# Patient Record
Sex: Female | Born: 1950 | Race: White | Hispanic: No | Smoking: Never smoker
Health system: Southern US, Community
[De-identification: ages and names within clinical notes are randomized; demographics above are authoritative.]

## PROBLEM LIST (undated history)

## (undated) DIAGNOSIS — I10 Essential (primary) hypertension: Secondary | ICD-10-CM

## (undated) DIAGNOSIS — J45909 Unspecified asthma, uncomplicated: Secondary | ICD-10-CM

## (undated) DIAGNOSIS — H409 Unspecified glaucoma: Secondary | ICD-10-CM

## (undated) DIAGNOSIS — F329 Major depressive disorder, single episode, unspecified: Secondary | ICD-10-CM

## (undated) DIAGNOSIS — Z860100 Personal history of colon polyps, unspecified: Secondary | ICD-10-CM

## (undated) DIAGNOSIS — F32A Depression, unspecified: Secondary | ICD-10-CM

## (undated) DIAGNOSIS — M1990 Unspecified osteoarthritis, unspecified site: Secondary | ICD-10-CM

## (undated) DIAGNOSIS — C4491 Basal cell carcinoma of skin, unspecified: Secondary | ICD-10-CM

## (undated) HISTORY — DX: Unspecified glaucoma: H40.9

## (undated) HISTORY — PX: JOINT REPLACEMENT: SHX530

## (undated) HISTORY — DX: Basal cell carcinoma of skin, unspecified: C44.91

## (undated) HISTORY — PX: MOHS SURGERY: SUR867

## (undated) HISTORY — PX: TOTAL HIP ARTHROPLASTY: SHX124

## (undated) HISTORY — DX: Unspecified asthma, uncomplicated: J45.909

## (undated) HISTORY — DX: Personal history of colonic polyps: Z86.010

## (undated) HISTORY — PX: REPLACEMENT TOTAL KNEE BILATERAL: SUR1225

## (undated) HISTORY — DX: Unspecified osteoarthritis, unspecified site: M19.90

## (undated) HISTORY — DX: Depression, unspecified: F32.A

## (undated) HISTORY — DX: Essential (primary) hypertension: I10

## (undated) HISTORY — DX: Personal history of colon polyps, unspecified: Z86.0100

---
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
## 1985-06-30 ENCOUNTER — Emergency Department: Admit: 1985-06-30 | Disposition: A | Discharge status: Home / Self Care | Payer: Self-pay | Source: Ambulatory Visit

## 1986-06-27 ENCOUNTER — Emergency Department: Admit: 1986-06-27 | Payer: Self-pay | Source: Ambulatory Visit

## 1988-06-08 ENCOUNTER — Emergency Department: Admit: 1988-06-08 | Payer: Self-pay | Source: Ambulatory Visit

## 1993-04-25 ENCOUNTER — Emergency Department: Admit: 1993-04-25 | Disposition: A | Discharge status: Home / Self Care | Payer: Self-pay | Source: Ambulatory Visit

## 1993-07-31 ENCOUNTER — Ambulatory Visit: Admit: 1993-07-31 | Disposition: A | Discharge status: Home / Self Care | Payer: Self-pay | Source: Ambulatory Visit

## 1993-12-28 ENCOUNTER — Ambulatory Visit: Admit: 1993-12-28 | Disposition: A | Discharge status: Home / Self Care | Payer: Self-pay | Source: Ambulatory Visit

## 1994-08-17 ENCOUNTER — Ambulatory Visit (INDEPENDENT_AMBULATORY_CARE_PROVIDER_SITE_OTHER): Admit: 1994-08-17 | Disposition: A | Discharge status: Home / Self Care | Payer: Self-pay | Source: Ambulatory Visit

## 1994-12-06 ENCOUNTER — Ambulatory Visit (INDEPENDENT_AMBULATORY_CARE_PROVIDER_SITE_OTHER): Admit: 1994-12-06 | Disposition: A | Discharge status: Home / Self Care | Payer: Self-pay | Source: Ambulatory Visit

## 1995-04-20 ENCOUNTER — Ambulatory Visit (INDEPENDENT_AMBULATORY_CARE_PROVIDER_SITE_OTHER): Admit: 1995-04-20 | Disposition: A | Discharge status: Home / Self Care | Payer: Self-pay | Source: Ambulatory Visit

## 1996-04-12 ENCOUNTER — Ambulatory Visit (INDEPENDENT_AMBULATORY_CARE_PROVIDER_SITE_OTHER): Admit: 1996-04-12 | Disposition: A | Discharge status: Home / Self Care | Payer: Self-pay | Source: Ambulatory Visit

## 1997-02-10 ENCOUNTER — Ambulatory Visit: Admit: 1997-02-10 | Disposition: A | Discharge status: Home / Self Care | Payer: Self-pay | Source: Ambulatory Visit

## 1997-06-18 ENCOUNTER — Ambulatory Visit (INDEPENDENT_AMBULATORY_CARE_PROVIDER_SITE_OTHER): Admit: 1997-06-18 | Disposition: A | Discharge status: Home / Self Care | Payer: Self-pay | Source: Ambulatory Visit

## 2000-10-14 ENCOUNTER — Ambulatory Visit (INDEPENDENT_AMBULATORY_CARE_PROVIDER_SITE_OTHER): Admit: 2000-10-14 | Disposition: A | Discharge status: Home / Self Care | Payer: Self-pay | Source: Ambulatory Visit

## 2000-11-26 ENCOUNTER — Ambulatory Visit
Admission: RE | Admit: 2000-11-26 | Disposition: A | Discharge status: Home / Self Care | Payer: Self-pay | Source: Ambulatory Visit

## 2002-11-25 ENCOUNTER — Ambulatory Visit (INDEPENDENT_AMBULATORY_CARE_PROVIDER_SITE_OTHER): Admit: 2002-11-25 | Disposition: A | Discharge status: Home / Self Care | Payer: Self-pay | Source: Ambulatory Visit

## 2003-03-04 ENCOUNTER — Ambulatory Visit (INDEPENDENT_AMBULATORY_CARE_PROVIDER_SITE_OTHER): Admit: 2003-03-04 | Disposition: A | Discharge status: Home / Self Care | Payer: Self-pay | Source: Ambulatory Visit

## 2003-11-18 ENCOUNTER — Ambulatory Visit
Admission: RE | Admit: 2003-11-18 | Disposition: A | Discharge status: Home / Self Care | Payer: Self-pay | Source: Ambulatory Visit

## 2004-12-07 ENCOUNTER — Ambulatory Visit
Admission: RE | Admit: 2004-12-07 | Disposition: A | Discharge status: Home / Self Care | Payer: Self-pay | Source: Ambulatory Visit

## 2004-12-12 ENCOUNTER — Ambulatory Visit
Admission: RE | Admit: 2004-12-12 | Disposition: A | Discharge status: Home / Self Care | Payer: Self-pay | Source: Ambulatory Visit

## 2004-12-27 ENCOUNTER — Ambulatory Visit
Admission: RE | Admit: 2004-12-27 | Disposition: A | Discharge status: Home / Self Care | Payer: Self-pay | Source: Ambulatory Visit

## 2005-05-12 ENCOUNTER — Emergency Department
Admission: EM | Admit: 2005-05-12 | Disposition: A | Discharge status: Home / Self Care | Payer: Self-pay | Source: Ambulatory Visit

## 2006-01-01 ENCOUNTER — Ambulatory Visit
Admission: RE | Admit: 2006-01-01 | Disposition: A | Discharge status: Home / Self Care | Payer: Self-pay | Source: Ambulatory Visit

## 2007-05-25 ENCOUNTER — Ambulatory Visit (INDEPENDENT_AMBULATORY_CARE_PROVIDER_SITE_OTHER): Admit: 2007-05-25 | Disposition: A | Discharge status: Home / Self Care | Payer: Self-pay | Source: Ambulatory Visit

## 2007-07-04 ENCOUNTER — Ambulatory Visit
Admission: RE | Admit: 2007-07-04 | Disposition: A | Discharge status: Home / Self Care | Payer: Self-pay | Source: Ambulatory Visit

## 2009-03-04 ENCOUNTER — Ambulatory Visit (INDEPENDENT_AMBULATORY_CARE_PROVIDER_SITE_OTHER): Admit: 2009-03-04 | Disposition: A | Discharge status: Home / Self Care | Payer: Self-pay | Source: Ambulatory Visit

## 2009-03-16 ENCOUNTER — Ambulatory Visit
Admission: RE | Admit: 2009-03-16 | Disposition: A | Discharge status: Home / Self Care | Payer: Self-pay | Source: Ambulatory Visit

## 2009-06-07 ENCOUNTER — Ambulatory Visit
Admission: RE | Admit: 2009-06-07 | Disposition: A | Discharge status: Home / Self Care | Payer: Self-pay | Source: Ambulatory Visit | Admitting: Gastroenterology

## 2009-12-11 ENCOUNTER — Ambulatory Visit (INDEPENDENT_AMBULATORY_CARE_PROVIDER_SITE_OTHER): Admit: 2009-12-11 | Disposition: A | Discharge status: Home / Self Care | Payer: Self-pay | Source: Ambulatory Visit

## 2010-06-28 ENCOUNTER — Ambulatory Visit
Admission: RE | Admit: 2010-06-28 | Disposition: A | Discharge status: Home / Self Care | Payer: Self-pay | Source: Ambulatory Visit

## 2010-07-29 ENCOUNTER — Ambulatory Visit (INDEPENDENT_AMBULATORY_CARE_PROVIDER_SITE_OTHER): Admit: 2010-07-29 | Disposition: A | Discharge status: Home / Self Care | Payer: Self-pay | Source: Ambulatory Visit

## 2010-08-28 ENCOUNTER — Ambulatory Visit
Admission: RE | Admit: 2010-08-28 | Disposition: A | Discharge status: Home / Self Care | Payer: Self-pay | Source: Ambulatory Visit

## 2010-10-18 ENCOUNTER — Ambulatory Visit
Admission: RE | Admit: 2010-10-18 | Disposition: A | Discharge status: Home / Self Care | Payer: Self-pay | Source: Ambulatory Visit

## 2010-11-30 ENCOUNTER — Ambulatory Visit
Admission: RE | Admit: 2010-11-30 | Disposition: A | Discharge status: Home / Self Care | Payer: Self-pay | Source: Ambulatory Visit

## 2011-02-21 ENCOUNTER — Ambulatory Visit
Admission: RE | Admit: 2011-02-21 | Disposition: A | Discharge status: Home / Self Care | Payer: Self-pay | Source: Ambulatory Visit

## 2012-11-07 ENCOUNTER — Ambulatory Visit
Admission: RE | Admit: 2012-11-07 | Disposition: A | Discharge status: Home / Self Care | Payer: Self-pay | Source: Ambulatory Visit

## 2013-01-06 ENCOUNTER — Ambulatory Visit
Admission: RE | Admit: 2013-01-06 | Disposition: A | Discharge status: Home / Self Care | Payer: Self-pay | Source: Ambulatory Visit | Attending: Gastroenterology | Admitting: Gastroenterology

## 2013-01-06 ENCOUNTER — Ambulatory Visit
Admission: RE | Admit: 2013-01-06 | Disposition: A | Discharge status: Home / Self Care | Payer: Self-pay | Source: Ambulatory Visit | Admitting: Gastroenterology

## 2014-11-01 ENCOUNTER — Ambulatory Visit
Admission: RE | Admit: 2014-11-01 | Discharge: 2014-11-01 | Disposition: A | Discharge status: Home / Self Care | Payer: BLUE CROSS/BLUE SHIELD | Source: Ambulatory Visit | Attending: Chiropractor | Admitting: Chiropractor

## 2014-11-01 ENCOUNTER — Other Ambulatory Visit: Payer: Self-pay | Admitting: Chiropractor

## 2014-11-01 ENCOUNTER — Ambulatory Visit
Admission: RE | Admit: 2014-11-01 | Discharge: 2014-11-01 | Disposition: A | Discharge status: Home / Self Care | Payer: BLUE CROSS/BLUE SHIELD | Source: Ambulatory Visit | Attending: Nurse Practitioner | Admitting: Nurse Practitioner

## 2014-11-01 ENCOUNTER — Other Ambulatory Visit: Payer: Self-pay | Admitting: Nurse Practitioner

## 2014-11-01 DIAGNOSIS — M5416 Radiculopathy, lumbar region: Secondary | ICD-10-CM

## 2014-11-01 DIAGNOSIS — Z1231 Encounter for screening mammogram for malignant neoplasm of breast: Secondary | ICD-10-CM

## 2015-03-03 ENCOUNTER — Other Ambulatory Visit: Payer: Self-pay | Admitting: Chiropractor

## 2015-03-03 ENCOUNTER — Ambulatory Visit
Admission: RE | Admit: 2015-03-03 | Discharge: 2015-03-03 | Disposition: A | Discharge status: Home / Self Care | Payer: BLUE CROSS/BLUE SHIELD | Source: Ambulatory Visit | Attending: Chiropractor | Admitting: Chiropractor

## 2015-03-03 DIAGNOSIS — M25571 Pain in right ankle and joints of right foot: Secondary | ICD-10-CM

## 2015-03-03 DIAGNOSIS — M19071 Primary osteoarthritis, right ankle and foot: Secondary | ICD-10-CM | POA: Insufficient documentation

## 2015-03-10 ENCOUNTER — Emergency Department: Payer: BLUE CROSS/BLUE SHIELD

## 2015-03-10 ENCOUNTER — Emergency Department
Admission: EM | Admit: 2015-03-10 | Discharge: 2015-03-10 | Disposition: A | Discharge status: Home / Self Care | Payer: BLUE CROSS/BLUE SHIELD | Attending: Emergency Medicine | Admitting: Emergency Medicine

## 2015-03-10 DIAGNOSIS — R197 Diarrhea, unspecified: Secondary | ICD-10-CM | POA: Insufficient documentation

## 2015-03-10 DIAGNOSIS — R112 Nausea with vomiting, unspecified: Secondary | ICD-10-CM | POA: Insufficient documentation

## 2015-03-10 DIAGNOSIS — K529 Noninfective gastroenteritis and colitis, unspecified: Secondary | ICD-10-CM

## 2015-03-10 DIAGNOSIS — R1010 Upper abdominal pain, unspecified: Secondary | ICD-10-CM | POA: Insufficient documentation

## 2015-03-10 LAB — COMPREHENSIVE METABOLIC PANEL
ALT: 27 U/L (ref 0–55)
AST (SGOT): 22 U/L (ref 10–42)
Albumin/Globulin Ratio: 1.15 Ratio (ref 0.70–1.50)
Albumin: 3.8 gm/dL (ref 3.5–5.0)
Alkaline Phosphatase: 52 U/L (ref 40–145)
Anion Gap: 16.8 mMol/L (ref 7.0–18.0)
BUN / Creatinine Ratio: 23.1 Ratio (ref 10.0–30.0)
BUN: 18 mg/dL (ref 7–22)
Bilirubin, Total: 0.5 mg/dL (ref 0.1–1.2)
CO2: 26.4 mMol/L (ref 20.0–30.0)
Calcium: 9.2 mg/dL (ref 8.5–10.5)
Chloride: 101 mMol/L (ref 98–110)
Creatinine: 0.78 mg/dL (ref 0.60–1.20)
EGFR: >60 mL/min/{1.73_m2}
Globulin: 3.3 gm/dL (ref 2.0–4.0)
Glucose: 129 mg/dL — ABNORMAL HIGH (ref 70–99)
Osmolality Calc: 285 mOsm/kg (ref 275–300)
Potassium: 3.2 mMol/L — ABNORMAL LOW (ref 3.5–5.3)
Protein, Total: 7.1 gm/dL (ref 6.0–8.3)
Sodium: 141 mMol/L (ref 136–147)

## 2015-03-10 LAB — VH URINALYSIS WITH MICROSCOPIC
Bilirubin, UA: NEGATIVE
Blood, UA: NEGATIVE
Glucose, UA: NEGATIVE mg/dL
Ketones UA: 20 mg/dL — AB
Leukocyte Esterase, UA: NEGATIVE Leu/uL
Nitrite, UA: NEGATIVE
Protein, UR: NEGATIVE mg/dL
RBC, UA: 2 /hpf (ref 0–5)
Squam Epithel, UA: <1 /hpf (ref 0–2)
Urine Specific Gravity: 1.014 (ref 1.001–1.040)
Urobilinogen, UA: NORMAL mg/dL
pH, Urine: 8 pH (ref 5.0–8.0)

## 2015-03-10 LAB — CBC AND DIFFERENTIAL
Basophils %: 0.6 % (ref 0.0–3.0)
Basophils Absolute: 0 10*3/uL (ref 0.0–0.3)
Eosinophils %: 0.9 % (ref 0.0–7.0)
Eosinophils Absolute: 0.1 10*3/uL (ref 0.0–0.8)
Hematocrit: 45.8 % (ref 36.0–48.0)
Hemoglobin: 15.5 gm/dL (ref 12.0–16.0)
Lymphocytes Absolute: 1.2 10*3/uL (ref 0.6–5.1)
Lymphocytes: 15.4 % (ref 15.0–46.0)
MCH: 33 pg (ref 28–35)
MCHC: 34 gm/dL (ref 32–36)
MCV: 98 fL (ref 80–100)
MPV: 7.8 fL (ref 6.0–10.0)
Monocytes Absolute: 0.5 10*3/uL (ref 0.1–1.7)
Monocytes: 6.4 % (ref 3.0–15.0)
Neutrophils %: 76.8 % (ref 42.0–78.0)
Neutrophils Absolute: 6 10*3/uL (ref 1.7–8.6)
PLT CT: 263 10*3/uL (ref 130–440)
RBC: 4.69 10*6/uL (ref 3.80–5.00)
RDW: 11 % (ref 11.0–14.0)
WBC: 7.8 10*3/uL (ref 4.0–11.0)

## 2015-03-10 LAB — LIPASE: Lipase: 15 U/L (ref 8–78)

## 2015-03-10 MED ORDER — ONDANSETRON HCL 4 MG PO TABS
4.0000 mg | ORAL_TABLET | Freq: Four times a day (QID) | ORAL | Status: DC | PRN
Start: 2015-03-10 — End: 2015-04-10

## 2015-03-10 MED ORDER — VH HYDROMORPHONE HCL PF 1 MG/ML CARPUJECT
1.0000 mg | Freq: Once | INTRAMUSCULAR | Status: AC
Start: 2015-03-10 — End: 2015-03-10
  Administered 2015-03-10: 1 mg via INTRAVENOUS

## 2015-03-10 MED ORDER — VH HYDROMORPHONE HCL 1 MG/ML (NARRATOR)
INTRAMUSCULAR | Status: AC
Start: 2015-03-10 — End: ?
  Filled 2015-03-10: qty 1

## 2015-03-10 MED ORDER — SODIUM CHLORIDE 0.9 % IV BOLUS
1000.0000 mL | Freq: Once | INTRAVENOUS | Status: AC
Start: 2015-03-10 — End: 2015-03-10
  Administered 2015-03-10: 1000 mL via INTRAVENOUS

## 2015-03-10 MED ORDER — ONDANSETRON HCL 4 MG/2ML IJ SOLN
4.0000 mg | Freq: Once | INTRAMUSCULAR | Status: AC
Start: 2015-03-10 — End: 2015-03-10
  Administered 2015-03-10: 4 mg via INTRAVENOUS

## 2015-03-10 MED ORDER — ONDANSETRON 4 MG PO TBDP
4.0000 mg | ORAL_TABLET | Freq: Four times a day (QID) | ORAL | Status: DC | PRN
Start: 2015-03-10 — End: 2015-03-11
  Administered 2015-03-10: 4 mg via ORAL

## 2015-03-10 MED ORDER — ONDANSETRON 4 MG PO TBDP
ORAL_TABLET | ORAL | Status: AC
Start: 2015-03-10 — End: ?
  Filled 2015-03-10: qty 3

## 2015-03-10 MED ORDER — ONDANSETRON HCL 4 MG/2ML IJ SOLN
INTRAMUSCULAR | Status: AC
Start: 2015-03-10 — End: ?
  Filled 2015-03-10: qty 2

## 2015-03-10 NOTE — ED Notes (Signed)
Pt states that for 3 days she has had upper abdominal with nausea, dry heaves, diarrhea. Pt has not been able to keep anything down po for these 3 days as well.Pts family thinks she has food poisoning.

## 2015-03-10 NOTE — ED Provider Notes (Signed)
Physician/Midlevel provider first contact with patient: 03/10/15 1807         History     Chief Complaint   Patient presents with   . Emesis     Patient is a 64 y.o. female presenting with vomiting. The history is provided by the patient.   Emesis  Associated symptoms: abdominal pain and diarrhea    Associated symptoms: no arthralgias, no headaches, no myalgias and no sore throat       Patient present sto ED with abdominal pain and emesis. Patient complains up upper abdominal pain for 3 days. Patient has had diarrhea and nausea with dry heaves for 3 days. Patient states her abdomen feels distended. Patient denies spotting blood in stool. Patient denies having chest pain or cough. Patient does have a history of asthma. Patient is not a diabetic. Patient has had 3 C-sections in the past. No current fever.      Past Medical History   Diagnosis Date   . Glaucoma        Past Surgical History   Procedure Laterality Date   . Cesarean section         History reviewed. No pertinent family history.    Social  History   Substance Use Topics   . Smoking status: Never Smoker    . Smokeless tobacco: Not on file   . Alcohol Use: Yes      Comment: daily       .     Allergies   Allergen Reactions   . Other      "eye drop"       Home Medications     Last Medication Reconciliation Action:  Unable to Assess Knox Saliva, RN 03/10/2015  6:19 PM          No Medications           Review of Systems   Constitutional: Negative for fever.   HENT: Negative for congestion, ear pain, facial swelling, mouth sores, nosebleeds and sore throat.    Eyes: Negative for pain.   Respiratory: Negative for cough.    Cardiovascular: Negative for chest pain and leg swelling.   Gastrointestinal: Positive for nausea, vomiting, abdominal pain and diarrhea.   Genitourinary: Negative for dysuria.   Musculoskeletal: Negative for myalgias, back pain, arthralgias and neck pain.   Skin: Negative for rash.   Neurological: Negative for headaches.    Psychiatric/Behavioral: Negative for confusion.       Physical Exam    BP: (!) 155/99 mmHg, Heart Rate: 63, Temp: 98.4 F (36.9 C), Resp Rate: 18, SpO2: 96 %, Weight: 84.9 kg    Physical Exam   Constitutional: She is oriented to person, place, and time. She appears well-developed and well-nourished. No distress.   HENT:   Head: Normocephalic.   Mouth/Throat: Oropharynx is clear and moist.   Eyes: Conjunctivae are normal. Pupils are equal, round, and reactive to light.   Neck: Normal range of motion. Neck supple.   Cardiovascular: Normal rate, regular rhythm and normal heart sounds.    Pulmonary/Chest: Effort normal and breath sounds normal. No respiratory distress.   Abdominal: Soft. There is tenderness. There is no guarding.   Mild diffuse tenderness. No guarding.   Musculoskeletal: Normal range of motion. She exhibits no tenderness.   Neurological: She is alert and oriented to person, place, and time.   Skin: Skin is warm and dry. She is not diaphoretic.   Psychiatric: She has a normal mood and affect. Her  behavior is normal.   Nursing note and vitals reviewed.        MDM and ED Course     ED Medication Orders     Start Ordered     Status Ordering Provider    03/10/15 2205 03/10/15 2205  ondansetron (ZOFRAN-ODT) disintegrating tablet 4 mg   Every 6 hours PRN     Route: Oral  Ordered Dose: 4 mg     Ordered Yitzel Shasteen J    03/10/15 1821 03/10/15 1820  HYDROmorphone (DILAUDID) injection 1 mg   Once in ED     Route: Intravenous  Ordered Dose: 1 mg     Last MAR action:  Given Olinda Nola J    03/10/15 1821 03/10/15 1820  ondansetron (ZOFRAN) injection 4 mg   Once in ED     Route: Intravenous  Ordered Dose: 4 mg     Last MAR action:  Given Louann Hopson J    03/10/15 1821 03/10/15 1820  sodium chloride 0.9 % bolus 1,000 mL   Once in ED     Route: Intravenous  Ordered Dose: 1,000 mL     Last MAR action:  Stopped Docie Abramovich J             MDM  Gastroenteritis, UTI,  dehydration        Procedures    Clinical Impression & Disposition     Clinical Impression  Final diagnoses:   Gastroenteritis        ED Disposition     Discharge [facility] discharge to home/self care.    Condition at disposition: Stable             New Prescriptions    ONDANSETRON (ZOFRAN) 4 MG TABLET    Take 1 tablet (4 mg total) by mouth every 6 (six) hours as needed.             The documentation recorded by my scribe, Coral Spikes, which reflects the services I personally performed and the decisions made by me. Raiford Simmonds, MD        Donalynn Furlong, MD  03/10/15 2206

## 2015-03-10 NOTE — Discharge Instructions (Signed)
Vomiting and Diarrhea, Nonspecific (Adult)  Vomiting and diarrhea can have many causes, including:   Helping your body get rid of harmful substances   Infections caused by viruses or bacteria. These include the stomach flu and food poisoning)   Allergy toor side effect ofa food or medicine   Severe stress or worry (anxiety)   Other illnesses   Pregnancy  It is often hard to pinpoint an exact cause, even with testing.Vomiting and diarrhea often go away within a day or two without problems. If they continue, though, they can lead to too much loss of fluid (dehydration). This can be serious if not treated.    Home care  Medications   You may use acetaminophen or ibuprofen to control fever, unless another medicine was prescribed. If you have chronic liver or kidney disease, talk with your health care provider before using these medicines. Also talk with your provider if you've had a stomach ulcer or GI bleeding. Don't give aspirin to anyone under 18 years of age who is ill with a fever.   If medicines for diarrhea or vomiting were prescribed, take these only as directed. Never take these without a health care provider's approval.  General care   If symptoms are severe, rest at home for the next 24 hours, or until you are feeling better.   Washing your hands with soap and water is the best way to stop the spread of infection. Wash your hands after touching anyone who is sick.   Wash your hands after using the toilet and before meals. Clean the toilet after each use.   Caffeine, tobacco, and alcohol can make the diarrhea, cramping, and pain worse. Remember, caffeine not only isin coffee, but also isin chocolate, some energy drinks, and teas.  Diet   Water and clear liquids are important so you don't get dehydrated. Drink a small amount at a time. Don't guzzle down the drinks.That may increase your nausea, make cramping worse, andcause the drinksto come back up.   Sports drinks may also help. Make  sure they are not too sugary, because this can sometimes make things worse. Also, don't drink beverages that are too acidic, like orange juice and grape juice.  Food   Don't force yourself to eat, especially if you have cramps, diarrhea, or vomiting.Eat just a little at a time, and then wait a few minutes before you try to eat more.   Don't eat fatty, greasy, spicy, or fried foods.   Don't eat dairy products if you have diarrhea. They can make it worse.  During the first24 hours(the first full day),follow the diet below:   Beverages: Sports drinks, soft drinks without caffeine, mineral water, and decaffeinated tea and coffee   Soups: Clear broth, consomm, and bouillon   Desserts: Plain gelatin, popsicles, and fruit juice bars  During the next 24 hours(the second day),you may add the following to the aboveif you are better. If not, continue what you did the first day:   Hot cereal, plain toast, bread, rolls, crackers   Plain noodles, rice, mashed potatoes, chicken noodle or rice soup   Unsweetened canned fruit (avoid pineapple), bananas   Limit fat intake to less than 15 grams per day by avoiding margarine, butter, oils, mayonnaise, sauces, gravies, fried foods, peanut butter, meat, poultry, and fish.   Limit fiber. Avoid raw or cooked vegetables, fresh fruits (except bananas) and bran cereals.   Limit caffeine and chocolate. No spices or seasonings except salt.  During the next   24 hours:   Gradually resume a normal diet, as you feel better and your symptoms improve.   If at any timeyour symptomsstart getting worse again, go back to clear liquids until you feel better.  Food preparation   If you have diarrhea, youshould not prepare food for others. When preparing foods, wash your hands before and after.   Wash your hands after using cutting boards, countertops, and knives that have been in contact with raw food.   Keep uncooked meats away from cooked and ready-to-eat foods.  Follow-up  care  Follow up with your health care advisor, or as advised. Call if you do not get better in the next 2 to 3 days. If a stool (diarrhea) sample was taken,or cultures done, you will be told if they are positive, or if your treatment needs to be changed. You may call as directed for the results.  If X-rays were taken, and a radiologist has not yet looked at them, he or she will do so. You will be told if there is a change in the reading, especially if it affects yourtreatment.  Call 911  Call 911 if any of these occur:   Trouble breathing   Confusion   Severe drowsiness or trouble awakening   Fainting or loss of consciousness   Rapid heart rate   Seizure   Stiff neck   Severe weakness, dizziness, or lightheadedness  When to seek medical advice  Call your health care provider right away if any of these occur:   Bloody or black vomit or stools.   Severe, steady abdominal pain or any abdominal pain that is getting worse.   Severe headache or stiff neck   An inability to hold down even sips of liquids for more than 12 hours.   Vomiting that lasts more than 24 hours.   Diarrhea that lasts more than 24 hours.   Fever of 100.4F (38.0C) or higher that lasts more than 48 hours, or as directed by your health care provider   Yellowish color to your skin or the whites of your eyes.   Signs of dehydration,such as dry mouth, little urine (less than every 6 hours), or very dark urine.   2000-2015 [employer] [address], [place] [address]. All rights reserved. This information is not intended as a substitute for professional medical care. Always follow your healthcare professional's instructions.

## 2015-04-10 ENCOUNTER — Emergency Department
Admission: EM | Admit: 2015-04-10 | Discharge: 2015-04-10 | Disposition: A | Discharge status: Home / Self Care | Payer: BLUE CROSS/BLUE SHIELD | Attending: Emergency Medicine | Admitting: Emergency Medicine

## 2015-04-10 ENCOUNTER — Emergency Department: Payer: BLUE CROSS/BLUE SHIELD

## 2015-04-10 DIAGNOSIS — I82611 Acute embolism and thrombosis of superficial veins of right upper extremity: Secondary | ICD-10-CM

## 2015-04-10 DIAGNOSIS — I10 Essential (primary) hypertension: Secondary | ICD-10-CM | POA: Insufficient documentation

## 2015-04-10 HISTORY — DX: Essential (primary) hypertension: I10

## 2015-04-10 HISTORY — DX: Unspecified asthma, uncomplicated: J45.909

## 2015-04-10 NOTE — ED Notes (Signed)
Pt has a swelling that is slightly purple on her right FA that "just popped up" this morning.

## 2015-04-10 NOTE — ED Provider Notes (Signed)
[facility]  EMERGENCY DEPARTMENT  History and Physical Exam       Patient Name: Kellie, Friedman  Encounter Date:  04/10/2015  Physician Assistant: Boykin Peek, PA-C  Attending Physician: Marvene Staff, MD  PCP: Macario Carls, NP  Patient DOB:  05-08-1951  MRN:  [id number]  Room:  E50/E50-A      History of Presenting Illness     Chief complaint: Mass    HPI/ROS given by: Patient    Kellie Friedman is a 64 y.o. female who presents with a small focal area of swelling and induration on the medial dorsum of the right forearm. She noticed this this morning. She denies any trauma or unusual physical activity. There is some bluish discoloration of the skin surrounding the area. There is no overlying erythema or lymphangitis. Neurovascular status is normal in the hand distally. There is no edema of the forearm or hand.     Review of Systems     Review of Systems   Constitutional: Negative for fever and chills.   Respiratory: Negative for shortness of breath.    Cardiovascular: Negative for chest pain.   Gastrointestinal: Negative for abdominal pain.   Skin: Positive for color change.   Neurological: Negative for speech difficulty.   Psychiatric/Behavioral: Negative for agitation.   All other systems reviewed and are negative.       Allergies & Medications     Pt is allergic to other.    Discharge Medication List as of 04/10/2015  4:09 PM      CONTINUE these medications which have NOT CHANGED    Details   dorzolamide (TRUSOPT) 2 % ophthalmic solution 1 drop 3 (three) times daily., Until Discontinued, Historical Med      FLUoxetine (PROZAC) 40 MG capsule Take 40 mg by mouth daily., Until Discontinued, Historical Med      Mepolizumab (NUCALA SC) Inject into the skin., Until Discontinued, Historical Med      triamterene-hydrochlorothiazide (MAXZIDE-25) 37.5-25 MG per tablet Take 1 tablet by mouth daily., Until Discontinued, Historical Med              Past Medical History     Pt has a past medical  history of Glaucoma; Hypertension; and Bronchial asthma.     Past Surgical History     Pt  has past surgical history that includes Cesarean section and Joint replacement.     Family History     The family history is not on file.     Social History     Pt reports that she has never smoked. She has never used smokeless tobacco. She reports that she drinks alcohol. She reports that she does not use illicit drugs.     Physical Exam     Blood pressure 152/87, pulse 61, temperature 98 F (36.7 C), temperature source Oral, resp. rate 17, height 1.6 m, weight 84.823 kg, SpO2 98 %.    Constitutional: Well developed, well nourished, active, in no apparent distress.  HENT:   Head: Normocephalic, atraumatic  Ears: No external lesions.  Nose: No external lesions. No epistaxis or drainage.  Eyes: PERRL. No scleral icterus. No conjunctival injection. EOMI.  Neck: Trachea is midline. No JVD. Normal range of motion. No apparent masses.  Cardiovascular: Regular rhythm, S1 normal and S2 normal. No murmur heard.  Pulmonary/Chest: Effort normal. Lungs clear to auscultation bilaterally.   Abdominal: Soft, non-tender, non-distended. No masses.   Genitourinay/Anorectal: Defferred  Musculoskeletal: Normal range of motion. No deformity  or apparent injury.   Neurological: Pt is alert. Cranial nerves are grossly intact. Moving all extremities without apparent deficit.   Psychiatric: Affect is appropriate. There is no agitation.   Skin: On the medial dorsum of the right forearm there is an approximately 1 cm nodular tender mass with a bluish hue. No lymphangitis. No edema. No overlying cellulitis. No cord.        Diagnostic Results     The results of the diagnostic studies below have been reviewed by myself:    Labs  Results     ** No results found for the last 24 hours. **          Radiologic Studies  No results found.     ED Course and Medical Decision Making     ED Medication Orders     None          Differential diagnosis includes  hematoma, superficial thrombophlebitis, DVT    I suspect this is a traumatic superficial venous injury. She may have a small clot formation. The suspicion for DVT. No evidence of inflammation. She is advised to use warm compresses and NSAIDs as needed. She plans on following up with her PCP on Tuesday for recheck.    I discussed this case with Dr Bradly Bienenstock in the emergency department who also directly examined the patient and agrees with the assessment and treatment plan.     In addition to the above history, please see nursing notes. Allergies, meds, past medical, family, social hx, and the results of the diagnostic studies performed have been reviewed by myself.    This chart was generated by an EMR and may contain errors or omissions not intended by the user.     Procedures / Critical Care     None     Diagnosis / Disposition     Clinical Impression  1. Superficial venous thrombosis of arm, right        Disposition  ED Disposition     Discharge [facility] discharge to home/self care.    Condition at disposition: Stable            Follow up for Discharged Patients  Macario Carls, NP  [address]  [place] [address]  [phone]    Schedule an appointment as soon as possible for a visit        Prescriptions for Discharged Patients  Discharge Medication List as of 04/10/2015  4:09 PM                 Boykin Peek, PA  04/10/15 2323    Marvene Staff, MD  04/11/15 [phone]

## 2015-09-21 ENCOUNTER — Ambulatory Visit (INDEPENDENT_AMBULATORY_CARE_PROVIDER_SITE_OTHER): Payer: BLUE CROSS/BLUE SHIELD | Admitting: Physician Assistant

## 2015-09-21 ENCOUNTER — Encounter (INDEPENDENT_AMBULATORY_CARE_PROVIDER_SITE_OTHER): Payer: Self-pay

## 2015-09-21 VITALS — BP 137/86 | HR 66 | Temp 98.6°F | Resp 19 | Ht 63.0 in | Wt 187.0 lb

## 2015-09-21 DIAGNOSIS — Y9289 Other specified places as the place of occurrence of the external cause: Secondary | ICD-10-CM

## 2015-09-21 DIAGNOSIS — Y998 Other external cause status: Secondary | ICD-10-CM

## 2015-09-21 DIAGNOSIS — W5501XA Bitten by cat, initial encounter: Secondary | ICD-10-CM

## 2015-09-21 DIAGNOSIS — S61431A Puncture wound without foreign body of right hand, initial encounter: Secondary | ICD-10-CM

## 2015-09-21 DIAGNOSIS — Y9389 Activity, other specified: Secondary | ICD-10-CM

## 2015-09-21 MED ORDER — HYDROCODONE-ACETAMINOPHEN 5-325 MG PO TABS
1.0000 | ORAL_TABLET | Freq: Four times a day (QID) | ORAL | Status: DC | PRN
Start: 2015-09-21 — End: 2017-02-10

## 2015-09-21 MED ORDER — CEFTRIAXONE SODIUM 1 G IJ SOLR
1.0000 g | Freq: Once | INTRAMUSCULAR | Status: AC
Start: 2015-09-21 — End: 2015-09-21
  Administered 2015-09-21: 1 g via INTRAMUSCULAR

## 2015-09-21 MED ORDER — AMOXICILLIN-POT CLAVULANATE 875-125 MG PO TABS
1.0000 | ORAL_TABLET | Freq: Two times a day (BID) | ORAL | Status: DC
Start: 2015-09-21 — End: 2017-02-10

## 2015-09-21 NOTE — Progress Notes (Signed)
Subjective:    Patient ID: Kellie Friedman is a 65 y.o. female.    Hand Injury   The incident occurred 12 to 24 hours ago. The incident occurred at home. Injury mechanism: cat bite. The pain is present in the right hand. The quality of the pain is described as aching. The pain does not radiate. The pain is at a severity of 7/10. The pain has been constant since the incident. The symptoms are aggravated by movement. She has tried ice for the symptoms. The treatment provided mild relief.       The following portions of the patient's history were reviewed and updated as appropriate: allergies, current medications, past family history, past medical history, past social history, past surgical history and problem list.    Review of Systems   Constitutional: Negative for fever.   Skin: Positive for wound.         Objective:    BP 137/86 mmHg  Pulse 66  Temp(Src) 98.6 F (37 C) (Oral)  Resp 19  Ht 1.6 m (5\' 3" )  Wt 84.823 kg (187 lb)  BMI 33.13 kg/m2    Physical Exam   Constitutional: She appears well-developed and well-nourished.   HENT:   Head: Normocephalic and atraumatic.   Eyes: Pupils are equal, round, and reactive to light.   Cardiovascular: Normal rate.    Pulmonary/Chest: Effort normal.   Musculoskeletal: She exhibits edema and tenderness.   Neurological: She is alert.   Skin: There is erythema.   Psychiatric: She has a normal mood and affect.   Nursing note and vitals reviewed.        Assessment and Plan:       Jennae was seen today for hand injury.    Diagnoses and all orders for this visit:    Cat bite, initial encounter  -     cefTRIAXone (ROCEPHIN) injection 1 g; Inject 1,000 mg (1 g total) into the muscle once.    -     amoxicillin-clavulanate (AUGMENTIN) 875-125 MG per tablet; Take 1 tablet by mouth 2 (two) times daily. Take with food.  Take with probiotics.  -     HYDROcodone-acetaminophen (NORCO) 5-325 MG per tablet; Take 1-2 tablets by mouth every 6 (six) hours as needed. Take at bedtime as  needed for pain.  Do not drive while taking.  -     Tdap vaccine greater than or equal to 7yo IM            Joyce Gross, [place]  [facility]  09/21/2015  4:33 PM

## 2015-09-21 NOTE — Patient Instructions (Signed)
Cat Bite    A cat bite can cause a wound deep enough to break the skin. In such cases, the wound is cleaned and then closed. Sometimes, the wound is not closed completely. This is so that fluid can drain if the wound becomes infected. In addition to wound care, a tetanus shot may be given, if needed.  Home Care   Wash your hands well with soap and warm water before and after caring for the wound. This helps lower the risk of infection.   Care for the wound as directed. If a dressing was applied to the wound, be sure to change it as directed.   If the wound bleeds, place a clean, soft cloth on the wound. Then firmly apply pressure until the bleeding stops. This may take up to 5 minutes. Do not release the pressure and look at the wound during this time.   Most wounds heal within10 days. But an infection can occur even with proper treatment. So be sure to check the wound daily for signs of infection (see below).   Antibiotics may be prescribed. These help prevent or treat infection. If you're given antibiotics, take them as directed. Also be sure to complete the medications.  Rabies Prevention  Rabies is a virus that can be carried in certain animals. These can include domestic animals such as cats and dogs. Pets fully vaccinated against rabies (2 shots) are at very low risk of infection. But because human rabies is almost always fatal,any biting pet should be confined for10 days as an extra precaution. In general, if there is a risk for rabies, the following steps may need to be taken:   If someone's pet cat has bitten you, it should be kept in a secure area for the next 10 days to watch for signs of illness. (If the pet owner won't allow this, contact your local animal control center.) If the cat becomes ill or dies during that time, contact your local animal control center at once so the animal may be tested for rabies. If the cat stays healthy for the next10 days, there is no danger of rabies in the  animal or you.   If astray cat bit you, contact your local animal control center. They can give information on capture, quarantine, and animal rabies testing.   If you can't locate the animal that bit you in the next2 days, and if rabies exists in your region, you may need to receive the rabies vaccine series. Call your health care provider right away. Or, return to the emergency department promptly.   All animal bites should be reported to the local animal control center. If you were not given a form to fill out, you can report this yourself.  Follow-up care  Follow up with your health care provider, or as directed.  When to seek medical advice  Call your health care provider right away if any of these occur:   Signs of infection:   Spreading rednessor warmthfrom the wound   Increased pain or swelling   Fever of 100.4F (38C) or higher, or as directed by your health care provider   Colored fluid or pus draining from the wound   Signs of rabies infection:   Headache   Confusion   Strange behavior   Seizure   Decreased ability to move any body part near the bite area   Bleeding that cannot be stopped after 5 minutes of firm pressure             2000-2015 [employer]. [address], [place] [address]. All rights reserved. This information is not intended as a substitute for professional medical care. Always follow your healthcare professional's instructions.

## 2015-11-24 LAB — PULMONARY FUNCTION TEST

## 2015-12-28 ENCOUNTER — Other Ambulatory Visit: Payer: Self-pay | Admitting: Nurse Practitioner

## 2015-12-28 DIAGNOSIS — Z1231 Encounter for screening mammogram for malignant neoplasm of breast: Secondary | ICD-10-CM

## 2015-12-28 DIAGNOSIS — Z780 Asymptomatic menopausal state: Secondary | ICD-10-CM

## 2016-01-04 ENCOUNTER — Other Ambulatory Visit: Payer: Self-pay | Admitting: Chiropractor

## 2016-01-04 ENCOUNTER — Ambulatory Visit
Admission: RE | Admit: 2016-01-04 | Discharge: 2016-01-04 | Disposition: A | Discharge status: Home / Self Care | Payer: BLUE CROSS/BLUE SHIELD | Source: Ambulatory Visit | Attending: Chiropractor | Admitting: Chiropractor

## 2016-01-04 DIAGNOSIS — M4603 Spinal enthesopathy, cervicothoracic region: Secondary | ICD-10-CM | POA: Insufficient documentation

## 2016-01-04 DIAGNOSIS — M419 Scoliosis, unspecified: Secondary | ICD-10-CM | POA: Insufficient documentation

## 2016-01-19 ENCOUNTER — Ambulatory Visit: Payer: BLUE CROSS/BLUE SHIELD

## 2016-01-24 ENCOUNTER — Ambulatory Visit
Admission: RE | Admit: 2016-01-24 | Discharge: 2016-01-24 | Disposition: A | Discharge status: Home / Self Care | Payer: BLUE CROSS/BLUE SHIELD | Source: Ambulatory Visit | Attending: Nurse Practitioner | Admitting: Nurse Practitioner

## 2016-01-24 DIAGNOSIS — Z96643 Presence of artificial hip joint, bilateral: Secondary | ICD-10-CM | POA: Insufficient documentation

## 2016-01-24 DIAGNOSIS — Z1231 Encounter for screening mammogram for malignant neoplasm of breast: Secondary | ICD-10-CM

## 2016-01-24 DIAGNOSIS — I10 Essential (primary) hypertension: Secondary | ICD-10-CM | POA: Insufficient documentation

## 2016-01-24 DIAGNOSIS — J45909 Unspecified asthma, uncomplicated: Secondary | ICD-10-CM | POA: Insufficient documentation

## 2016-01-24 DIAGNOSIS — H409 Unspecified glaucoma: Secondary | ICD-10-CM | POA: Insufficient documentation

## 2016-01-24 DIAGNOSIS — Z13820 Encounter for screening for osteoporosis: Secondary | ICD-10-CM | POA: Insufficient documentation

## 2016-01-24 DIAGNOSIS — Z780 Asymptomatic menopausal state: Secondary | ICD-10-CM | POA: Insufficient documentation

## 2016-01-24 DIAGNOSIS — Z96653 Presence of artificial knee joint, bilateral: Secondary | ICD-10-CM | POA: Insufficient documentation

## 2016-04-18 ENCOUNTER — Other Ambulatory Visit
Admission: RE | Admit: 2016-04-18 | Discharge: 2016-04-18 | Disposition: A | Discharge status: Home / Self Care | Payer: BLUE CROSS/BLUE SHIELD | Source: Ambulatory Visit | Attending: Gastroenterology | Admitting: Gastroenterology

## 2016-04-18 ENCOUNTER — Ambulatory Visit (INDEPENDENT_AMBULATORY_CARE_PROVIDER_SITE_OTHER): Payer: BLUE CROSS/BLUE SHIELD

## 2016-04-18 ENCOUNTER — Other Ambulatory Visit: Payer: Self-pay | Admitting: Gastroenterology

## 2016-04-18 DIAGNOSIS — Z86010 Personal history of colonic polyps: Secondary | ICD-10-CM | POA: Insufficient documentation

## 2016-04-18 LAB — HM COLONOSCOPY

## 2017-02-10 ENCOUNTER — Emergency Department: Payer: BLUE CROSS/BLUE SHIELD

## 2017-02-10 ENCOUNTER — Emergency Department
Admission: EM | Admit: 2017-02-10 | Discharge: 2017-02-10 | Disposition: A | Discharge status: Home / Self Care | Payer: BLUE CROSS/BLUE SHIELD | Attending: Emergency Medicine | Admitting: Emergency Medicine

## 2017-02-10 DIAGNOSIS — S61210A Laceration without foreign body of right index finger without damage to nail, initial encounter: Secondary | ICD-10-CM | POA: Insufficient documentation

## 2017-02-10 DIAGNOSIS — S62660B Nondisplaced fracture of distal phalanx of right index finger, initial encounter for open fracture: Secondary | ICD-10-CM | POA: Insufficient documentation

## 2017-02-10 DIAGNOSIS — Z23 Encounter for immunization: Secondary | ICD-10-CM | POA: Insufficient documentation

## 2017-02-10 DIAGNOSIS — W231XXA Caught, crushed, jammed, or pinched between stationary objects, initial encounter: Secondary | ICD-10-CM | POA: Insufficient documentation

## 2017-02-10 MED ORDER — LIDOCAINE HCL (PF) 2 % IJ SOLN
INTRAMUSCULAR | Status: AC
Start: 2017-02-10 — End: ?
  Filled 2017-02-10: qty 10

## 2017-02-10 MED ORDER — TETANUS-DIPHTHERIA TOXOIDS TD 5-2 LFU IM INJ
0.5000 mL | INJECTION | Freq: Once | INTRAMUSCULAR | Status: AC
Start: 2017-02-10 — End: 2017-02-10
  Administered 2017-02-10: 14:00:00 0.5 mL via INTRAMUSCULAR

## 2017-02-10 MED ORDER — IBUPROFEN 600 MG PO TABS
600.0000 mg | ORAL_TABLET | Freq: Once | ORAL | Status: AC
Start: 2017-02-10 — End: 2017-02-10
  Administered 2017-02-10: 14:00:00 600 mg via ORAL

## 2017-02-10 MED ORDER — HYDROCODONE-ACETAMINOPHEN 5-325 MG PO TABS
1.0000 | ORAL_TABLET | Freq: Four times a day (QID) | ORAL | 0 refills | Status: AC | PRN
Start: 2017-02-10 — End: ?

## 2017-02-10 MED ORDER — LIDOCAINE(URO-JET) 2% JELLY (WRAP)
CUTANEOUS | Status: DC | PRN
Start: 2017-02-10 — End: 2017-02-10

## 2017-02-10 MED ORDER — TETANUS-DIPHTHERIA TOXOIDS TD 5-2 LFU IM INJ
INJECTION | INTRAMUSCULAR | Status: AC
Start: 2017-02-10 — End: ?
  Filled 2017-02-10: qty 0.5

## 2017-02-10 MED ORDER — IBUPROFEN 600 MG PO TABS
ORAL_TABLET | ORAL | Status: AC
Start: 2017-02-10 — End: ?
  Filled 2017-02-10: qty 1
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
## 2017-02-10 NOTE — Discharge Instructions (Signed)
Finger Fracture,Open  You have a broken finger (fracture) with a nearby cut, puncture, or deep scrape. This causes local pain, swelling, and bruising. Because of the open injury, you are at risk for infection in the skin and bone. You will take antibiotics to lower the risk for infection.  This injury takes about 4 weeks to heal. Finger injuries are often treated with a splint or cast, or by taping the injured finger to the next one (buddy taping). This protects the injured finger and holds the bone in position while it heals. More serious fractures may need surgery.  If the fingernail has been severely injured, it will probably fall off in 1 to 2 weeks. A new fingernail will usually start to grow back within a month.  Home care  Follow these guidelines when caring for yourself at home:   Keep your hand elevated to reduce pain and swelling. When sitting or lying down keep your arm above the level of your heart. You can do this by placing your arm on a pillow that rests on your chest or on a pillow at your side. This is most important during the first 2 days (48 hours) after the injury.   Put an ice pack on the injured area. Do this for 20 minutes every 1 to 2 hours the first day for pain relief. You can make an ice pack by wrapping a plastic bag of ice cubes in a thin towel. As the ice melts, be careful that the cast or splint doesn't get wet. Continue using the ice pack 3 to 4 times a day until the pain and swelling go away.   Keep the cast or splint completely dry at all times. Bathe with your cast or splint out of the water. Protect it with a large plastic bag, rubber-banded at the top end. If a fiberglass cast or splint gets wet, you can dry it with a hair dryer.   You may use acetaminophen or ibuprofen to control pain, unless another pain medicine was prescribed. If you have chronic liver or kidney disease, talk with your health care provider before using these medicines. Also talk with your provider if  you've had a stomach ulcer or GI bleeding.   If buddy tape was applied and it becomes wet or dirty, change it. You may replace it with paper, plastic, or cloth tape. Cloth tape and paper tapes must be kept dry. Keep the buddy tape in place for at least 4 weeks.   Take all antibiotics until you have finished them.   Don't put creams or objects under the cast if you have itching.  Follow-up care  Follow up with your health care provider within 1 week, or as advised. This is to make sure the bone is healing the way it should.  If X-rays were taken, a radiologist will look at them. You will be told of any new findings that may affect your care.  When to seek medical advice  Call your health care provider right away if any of these occur:   The cast cracks   The plaster cast or splint becomes wet or soft   The fiberglass cast or splint stays wet for more than 24 hours   Pain or swelling gets worse   Tightness or pressure under the cast gets worse   Finger becomes cold, blue, numb, or tingly   You can't move your finger   Redness, warmth, swelling, drainage from the wound, or foul odor from   a cast or splint   Fever of 101F(38C) or higher, or as directed by your health care provider  Date Last Reviewed: 11/01/2013   2000-2016 [employer] [address], [place] [address]. All rights reserved. This information is not intended as a substitute for professional medical care. Always follow your healthcare professional's instructions.

## 2017-02-10 NOTE — ED Provider Notes (Signed)
[facility]  EMERGENCY DEPARTMENT  History and Physical Exam       Patient Name: Kellie Friedman, Kellie Friedman  Encounter Date:  02/10/2017  Attending Physician: Myna Hidalgo, MD  PCP: Macario Carls, NP  Patient DOB:  1951/09/01  MRN:  [id number]  Room:  E58/E58-A      History of Presenting Illness     Chief complaint: Hand Injury      HPI/ROS is limited by: none  HPI/ROS given by: Patient    Kellie Friedman is a 66 y.o. female who presents with hand injury for 20 minute(s). Symptoms have been continuous. Pain is reported to be a 8/10 at this time. Symptoms have been worsened by movement of fingers and relieved by nothing.  There was not radiation.  Associated symptoms are finger pain and wound.    Pt states that a storm window came down and crushed her fingers of both hands. Pt states that her bilateral index fingers and right hand hurt the most. Pt denies being on a blood thinner. Pt is not sure of when her last tetanus was, but believes it might be up to date.     Review of Systems     Review of Systems   Constitutional: Negative for fever.   Musculoskeletal: Positive for arthralgias.   Skin: Positive for wound.   Neurological: Negative for weakness, numbness and headaches.   All other systems reviewed and are negative.       Allergies & Medications     Pt is allergic to other.    Home Meds: EMR link not correct; nurses' notes reviewed for meds and dosages     Past Medical History     Pt has a past medical history of Bronchial asthma; Glaucoma; and Hypertension.     Past Surgical History     Pt  has a past surgical history that includes Cesarean section and Joint replacement.     Family History     The family history includes No known problems in her father and mother.     Social History     Pt reports that she has never smoked. She has never used smokeless tobacco. She reports that she drinks alcohol. She reports that she does not use drugs.     Physical Exam     Blood pressure (!) 168/97, pulse  65, temperature 97.5 F (36.4 C), temperature source Tympanic, resp. rate 16, height 1.575 m, weight 91.2 kg, SpO2 98 %.    Physical Exam   Constitutional: She is oriented to person, place, and time and well-developed, well-nourished, and in no distress. No distress.   HENT:   Head: Normocephalic.   Eyes: Conjunctivae are normal.   Neck: Normal range of motion.   Pulmonary/Chest: No respiratory distress.   Musculoskeletal: Normal range of motion.   Neurological: She is alert and oriented to person, place, and time. She has normal sensation and normal strength.   Skin: Laceration noted. No pallor.   Laceration of dorsal side of left index finger proximal to nail fold, no nail involvement  Laceration of dorsal side of right index finger proximal to nail fold, no nail involvement   Skin flap to tip of right index finger   Psychiatric: Memory and affect normal.   Nursing note and vitals reviewed.       Diagnostic Results     The results of the diagnostic studies below have been reviewed by myself:    Labs  Labs Reviewed -  No data to display    Radiologic Studies  Xr Finger(s) Left Minimum 2 View    Result Date: 02/10/2017  No evidence of acute displaced fracture or dislocation of the left index finger. If clinical suspicion for occult fracture remains, follow-up is suggested. ReadingStation:[id number]    Xr Finger(s) Right Minimum 2 View    Result Date: 02/10/2017  Soft tissue laceration and impaction fracture terminal tuft right second distal phalanx. ReadingStation:[email or web address]      EKG: none     ED Course & Treatment     ED Medication Orders     Start Ordered     Status Ordering Provider    02/10/17 1341 02/10/17 1340  ibuprofen (ADVIL,MOTRIN) tablet 600 mg  Once in ED     Route: Oral  Ordered Dose: 600 mg     Last MAR action:  Given Chelby Salata C    02/10/17 1330 02/10/17 1330  lidocaine (URO-JET) 2% jelly  As needed     Route: Topical     Last MAR action:  Given Jalien Weakland C    02/10/17 1326 02/10/17 1325   tetanus & diphtheria toxoids (adult) (TENIVAC) injection 0.5 mL  Once in ED     Route: Intramuscular  Ordered Dose: 0.5 mL     Last MAR action:  Given Brentlee Delage C        Previous records reviewed.  D/dx, workup, anticipated clinical course discussed with patient/family.  Results reviewed with patient/family.  Patient appreciative of care.  All questions answered.  Patient/family comfortable with treatment plan.      Medical Decision Making     The differential diagnosis includes, but is not limited to fracture, sprain, strain, contusion, hematoma, laceration, compartment syndrome    This chart was generated by an EMR and may contain errors or omissions not intended by the user.     Procedures / Critical Care     LACERATION REPAIR   By Myna Hidalgo, MD  2:24 PM    Location: distal tip of right index finger  Length: 1 cm flap  Layers: single  Contamination: clean  Suture(s): #5-0 nylon    Verbal consent.  Patient identified and location of wound verified.  Tetanus will be updated in ED.  Appropriate sterile precautions observed with saline, gloves and sterile drape.  Lidocaine 2% given locally.  Wound and surrounding structures checked and no further trauma found. No joint involvement observed. No FBs seen. Dermabond was placed on other smaller lacerations on right and left index fingers. Patient tolerated procedure well with no apparent complications.     Diagnosis / Disposition     Clinical Impression  1. Open nondisplaced fracture of distal phalanx of right index finger, initial encounter        Disposition  ED Disposition     ED Disposition Condition Date/Time Comment    Discharge  Sun Feb 10, 2017  2:40 PM Carolynn Serve discharge to home/self care.    Condition at disposition: Stable          Follow up for Discharged Patients  [facility] Emergency Department  [address]  [place] [address]  [phone]  In 10 days  For suture removal      Prescriptions for Discharged Patients  New  Prescriptions    HYDROCODONE-ACETAMINOPHEN (NORCO) 5-325 MG PER TABLET    Take 1-2 tablets by mouth every 6 (six) hours as needed for Pain.for up to 15 doses       The  documentation recorded by my scribe, Consepcion Hearing, accurately reflects the services I personally performed and the decisions made by me.  Myna Hidalgo, MD                  Myna Hidalgo, MD  02/10/17 [phone]

## 2017-02-12 ENCOUNTER — Encounter: Payer: Self-pay | Admitting: Emergency Medicine

## 2017-02-12 LAB — PULMONARY FUNCTION TEST

## 2017-08-29 ENCOUNTER — Encounter: Payer: Self-pay | Admitting: Nurse Practitioner

## 2017-08-29 DIAGNOSIS — Z1231 Encounter for screening mammogram for malignant neoplasm of breast: Secondary | ICD-10-CM

## 2017-08-30 ENCOUNTER — Ambulatory Visit
Admission: RE | Admit: 2017-08-30 | Discharge: 2017-08-30 | Disposition: A | Discharge status: Home / Self Care | Payer: BLUE CROSS/BLUE SHIELD | Source: Ambulatory Visit | Attending: Nurse Practitioner | Admitting: Nurse Practitioner

## 2017-08-30 DIAGNOSIS — Z1231 Encounter for screening mammogram for malignant neoplasm of breast: Secondary | ICD-10-CM | POA: Insufficient documentation

## 2017-10-18 DIAGNOSIS — J4550 Severe persistent asthma, uncomplicated: Secondary | ICD-10-CM | POA: Diagnosis not present

## 2017-11-19 DIAGNOSIS — J4550 Severe persistent asthma, uncomplicated: Secondary | ICD-10-CM | POA: Diagnosis not present

## 2017-12-16 DIAGNOSIS — J4550 Severe persistent asthma, uncomplicated: Secondary | ICD-10-CM | POA: Diagnosis not present

## 2017-12-25 DIAGNOSIS — H2513 Age-related nuclear cataract, bilateral: Secondary | ICD-10-CM | POA: Diagnosis not present

## 2017-12-25 DIAGNOSIS — H401132 Primary open-angle glaucoma, bilateral, moderate stage: Secondary | ICD-10-CM | POA: Diagnosis not present

## 2018-01-14 DIAGNOSIS — Z7951 Long term (current) use of inhaled steroids: Secondary | ICD-10-CM | POA: Diagnosis not present

## 2018-01-14 DIAGNOSIS — J4550 Severe persistent asthma, uncomplicated: Secondary | ICD-10-CM | POA: Diagnosis not present

## 2018-01-14 DIAGNOSIS — Z6838 Body mass index (BMI) 38.0-38.9, adult: Secondary | ICD-10-CM | POA: Diagnosis not present

## 2018-02-13 DIAGNOSIS — J4550 Severe persistent asthma, uncomplicated: Secondary | ICD-10-CM | POA: Diagnosis not present

## 2018-03-26 DIAGNOSIS — J4550 Severe persistent asthma, uncomplicated: Secondary | ICD-10-CM | POA: Diagnosis not present

## 2018-03-26 DIAGNOSIS — I10 Essential (primary) hypertension: Secondary | ICD-10-CM | POA: Diagnosis not present

## 2018-03-26 DIAGNOSIS — J310 Chronic rhinitis: Secondary | ICD-10-CM | POA: Diagnosis not present

## 2018-04-17 DIAGNOSIS — H2513 Age-related nuclear cataract, bilateral: Secondary | ICD-10-CM | POA: Diagnosis not present

## 2018-04-17 DIAGNOSIS — H401132 Primary open-angle glaucoma, bilateral, moderate stage: Secondary | ICD-10-CM | POA: Diagnosis not present

## 2018-04-23 DIAGNOSIS — J4550 Severe persistent asthma, uncomplicated: Secondary | ICD-10-CM | POA: Diagnosis not present

## 2018-05-01 DIAGNOSIS — Z96651 Presence of right artificial knee joint: Secondary | ICD-10-CM | POA: Diagnosis not present

## 2018-05-01 DIAGNOSIS — Z96653 Presence of artificial knee joint, bilateral: Secondary | ICD-10-CM | POA: Diagnosis not present

## 2018-05-01 DIAGNOSIS — Z96652 Presence of left artificial knee joint: Secondary | ICD-10-CM | POA: Diagnosis not present

## 2018-05-01 DIAGNOSIS — Z96643 Presence of artificial hip joint, bilateral: Secondary | ICD-10-CM | POA: Diagnosis not present

## 2018-05-13 DIAGNOSIS — J309 Allergic rhinitis, unspecified: Secondary | ICD-10-CM | POA: Diagnosis not present

## 2018-05-13 DIAGNOSIS — H9313 Tinnitus, bilateral: Secondary | ICD-10-CM | POA: Diagnosis not present

## 2018-05-13 DIAGNOSIS — J45909 Unspecified asthma, uncomplicated: Secondary | ICD-10-CM | POA: Diagnosis not present

## 2018-05-22 DIAGNOSIS — H9319 Tinnitus, unspecified ear: Secondary | ICD-10-CM | POA: Diagnosis not present

## 2018-05-22 DIAGNOSIS — E785 Hyperlipidemia, unspecified: Secondary | ICD-10-CM | POA: Diagnosis not present

## 2018-05-22 DIAGNOSIS — I10 Essential (primary) hypertension: Secondary | ICD-10-CM | POA: Diagnosis not present

## 2018-05-22 DIAGNOSIS — F329 Major depressive disorder, single episode, unspecified: Secondary | ICD-10-CM | POA: Diagnosis not present

## 2018-05-23 DIAGNOSIS — J4550 Severe persistent asthma, uncomplicated: Secondary | ICD-10-CM | POA: Diagnosis not present

## 2018-05-26 ENCOUNTER — Ambulatory Visit (INDEPENDENT_AMBULATORY_CARE_PROVIDER_SITE_OTHER): Payer: Medicare Other | Admitting: Emergency Medicine

## 2018-05-26 ENCOUNTER — Encounter: Payer: Self-pay | Admitting: Emergency Medicine

## 2018-05-26 VITALS — BP 142/80 | HR 72 | Ht 62.25 in | Wt 214.0 lb

## 2018-05-26 DIAGNOSIS — J45909 Unspecified asthma, uncomplicated: Secondary | ICD-10-CM | POA: Insufficient documentation

## 2018-05-26 DIAGNOSIS — J4540 Moderate persistent asthma, uncomplicated: Secondary | ICD-10-CM

## 2018-05-26 MED ORDER — ALBUTEROL SULFATE HFA 108 (90 BASE) MCG/ACT IN AERS: 2.0000 | INHALATION_SPRAY | RESPIRATORY_TRACT | 5 refills | Status: DC | PRN

## 2018-05-26 NOTE — Progress Notes (Signed)
Subjective:    Patient ID: Ana Hughes, female    DOB: 05-18-1951, 67 y.o.   MRN: [id number]  HPI This is a new consult visit for 67 year old never smoker who carries a history of asthma.  She is currently managed on Dulera and Nucala, but notes that she has only been using her Dulera prn.  She just moved from [place], [place].  Her asthma was originally diagnosed about 5-6 yrs ago, was having episodes of freq bronchitis. Seemed to clinically benefit from albuterol, prednisone. She had obstruction confirmed on PFT, was treated with [facility] with some benefit. Most recent.y started on [place]. She had significant clinical benefit > less flaring, prednisone, day-to-day breathing.  Her albuterol use > minimal, but she has started using her Dulera prn instead of scheduled.   Outside records reviewed  > WBC 5.5, 4.2% eosinophils (05/12/2015)  Most recent spiro 03/26/18 >> FEV1 1.27 L (55% predicted), FVC 1.80 L (59% predicted), ratio 71%.  There is curve on her flow volume loop. Most recent TLC 3.39 (72% predicted) 02/12/2017 Most recent DLCO (uncorrected) 15.1 (67% predicted) 02/12/2017   Review of Systems  Constitutional: Negative for fever and unexpected weight change.  HENT: Negative for congestion, dental problem, ear pain, nosebleeds, postnasal drip, rhinorrhea, sinus pressure, sneezing, sore throat and trouble swallowing.   Eyes: Negative for redness and itching.  Respiratory: Negative for cough, chest tightness, shortness of breath and wheezing.   Cardiovascular: Negative for palpitations and leg swelling.  Gastrointestinal: Negative for nausea and vomiting.  Genitourinary: Negative for dysuria.  Musculoskeletal: Negative for joint swelling.  Skin: Negative for rash.  Neurological: Negative for headaches.  Hematological: Does not bruise/bleed easily.  Psychiatric/Behavioral: Negative for dysphoric mood. The patient is not nervous/anxious.    Past Medical History:  Diagnosis Date  .  Asthma   . Glaucoma   . Hypertension      No family history on file.   Social History   Socioeconomic History  . Marital status: Widowed    Spouse name: Not on file  . Number of children: Not on file  . Years of education: Not on file  . Highest education level: Not on file  Occupational History  . Not on file  Social Needs  . Financial resource strain: Not on file  . Food insecurity:    Worry: Not on file    Inability: Not on file  . Transportation needs:    Medical: Not on file    Non-medical: Not on file  Tobacco Use  . Smoking status: Never Smoker  . Smokeless tobacco: Never Used  Substance and Sexual Activity  . Alcohol use: Not on file  . Drug use: Not on file  . Sexual activity: Not on file  Lifestyle  . Physical activity:    Days per week: Not on file    Minutes per session: Not on file  . Stress: Not on file  Relationships  . Social connections:    Talks on phone: Not on file    Gets together: Not on file    Attends religious service: Not on file    Active member of club or organization: Not on file    Attends meetings of clubs or organizations: Not on file    Relationship status: Not on file  . Intimate partner violence:    Fear of current or ex partner: Not on file    Emotionally abused: Not on file    Physically abused: Not on file  Forced sexual activity: Not on file  Other Topics Concern  . Not on file  Social History Narrative  . Not on file     Allergies  Allergen Reactions  . [place] [Brinzolamide-Brimonidine]      Outpatient Medications Prior to Visit  Medication Sig Dispense Refill  . albuterol (PROVENTIL) (2.5 MG/3ML) 0.083% nebulizer solution Take 2.5 mg by nebulization every 6 (six) hours as needed for wheezing or shortness of breath.    . bimatoprost (LUMIGAN) 0.01 % SOLN Place 1 drop into both eyes at bedtime.    . dorzolamide (TRUSOPT) 2 % ophthalmic solution Place 1 drop into both eyes 3 (three) times daily.    .  ergocalciferol (VITAMIN D2) 50000 units capsule Take 50,000 Units by mouth once a week.    Marland Kitchen FLUoxetine (PROZAC) 40 MG capsule Take 40 mg by mouth daily.    Marland Kitchen losartan (COZAAR) 50 MG tablet Take 50 mg by mouth daily.    . Mepolizumab (NUCALA) 100 MG/ML SOAJ Inject 100 mg into the skin every 28 (twenty-eight) days.    . mometasone-formoterol (DULERA) 200-5 MCG/ACT AERO Inhale 2 puffs into the lungs 2 (two) times daily.     No facility-administered medications prior to visit.         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  She is a never smoker.  She had a profound improvement when started on Nucala.  She has in fact stopped taking her Dulera on a schedule, has been using as needed.  She does not take it every day.  It may be possible for [place] to stepdown her scheduled regimen to an ICS alone.  I like for her to go back to the [facility] on a schedule, report back whether she is had any interval clinical benefit.  If not then I will make the change to an ICS.  We will refill albuterol.  Her flu shot is up-to-date.  Get her records from [place], [place].    Baltazar Apo, MD, PhD 05/26/2018, 10:47 AM [facility] [phone] or if no answer [phone]

## 2018-05-26 NOTE — Assessment & Plan Note (Signed)
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  She is a never smoker.  She had a profound improvement when started on Nucala.  She has in fact stopped taking her Dulera on a schedule, has been using as needed.  She does not take it every day.  It may be possible for [place] to stepdown her scheduled regimen to an ICS alone.  I like for her to go back to the [facility] on a schedule, report back whether she is had any interval clinical benefit.  If not then I will make the change to an ICS.  We will refill albuterol.  Her flu shot is up-to-date.  Get her records from [place], [place].

## 2018-06-18 ENCOUNTER — Telehealth: Payer: Self-pay | Admitting: Emergency Medicine

## 2018-06-20 ENCOUNTER — Telehealth: Payer: Self-pay | Admitting: Emergency Medicine

## 2018-06-20 NOTE — Telephone Encounter (Deleted)
Called pt and let her know we had her Xolair and supplies. She was happy to hear that.  Pt received a letter from her ins or spec. Pharm. Letting her know her P/A runs out in Nov.. Pt is going to bring it with her when she comes in for her inj.. She is still in [place] but she is moving to [place].

## 2018-06-20 NOTE — Telephone Encounter (Signed)
Routing to [employer] for her to follow up on.

## 2018-06-23 NOTE — Telephone Encounter (Signed)
Called pt, left vm tcb.

## 2018-06-23 NOTE — Telephone Encounter (Signed)
Called pt was unable to reach her 10/4 and 7/19. Please refer to ph note 06/20/18. For updates.

## 2018-06-24 NOTE — Telephone Encounter (Signed)
TS please advise, thank you.  

## 2018-06-26 NOTE — Telephone Encounter (Signed)
No documents in Injection room on patient. Spoke with [facility] to [place] forms on hand since 2016. She had been receiving her Nucala injections in [place] but has since moved here. Pt is aware that she will need to come by the office to fill out new forms for Nucala to go under Dr. Agustina Caroli name and get started here.

## 2018-06-27 NOTE — Telephone Encounter (Signed)
Pt is aware that she does not have any Nucala medication here on hand and has come by the office today to sign forms to start Nucala here in our office. Her last injection was 05-2018(slightly over 28 days) and needs this as soon as possible. Pt also left information about BCBS approval for Nucala. I have sent all information to [facility] to [place] request as patient has OV with RB 07-03-18 and would like to get Nucala injection then.

## 2018-07-02 NOTE — Telephone Encounter (Signed)
Please call patient and let her know of the benefits; please order her medication ASAP so she can have her injections-she is hoping to get them tomorrow when she comes in to see RB for her apt at 11:15 am tomorrow. Please check with RB about wait time as patient has been on medication and is overdue now. He may not require her to wait 2 hours but will require to her have Epipen on hand.

## 2018-07-02 NOTE — Telephone Encounter (Signed)
Called Nucala in to [facility], it will be here tomorrow, just not sure what time of day. I asked if they could expedite the order, rep said there would be an additional fee of $80. No,thank you! Please put on pkg. A.M. Delivery and we'll take our chances. Katie told me to use a sample if it doesn't come in, in time.

## 2018-07-02 NOTE — Telephone Encounter (Signed)
Called pt to make her aware. I left another message with her vm.. Will leave note open in case pt has questions.

## 2018-07-02 NOTE — Telephone Encounter (Signed)
I just received pt's summary of benefits. Pt is on Medicare and BCBS . We will have to buy & bill. The 20% Medicare doesn't pay, BCBS will. (medicine) Admin. Co-pay/Coins.: $70, [employer] has a reimbursement program for that. Will route to Katie to make her aware.

## 2018-07-03 ENCOUNTER — Encounter: Payer: Self-pay | Admitting: Emergency Medicine

## 2018-07-03 ENCOUNTER — Ambulatory Visit (INDEPENDENT_AMBULATORY_CARE_PROVIDER_SITE_OTHER): Payer: Medicare Other | Admitting: Emergency Medicine

## 2018-07-03 ENCOUNTER — Telehealth: Payer: Self-pay | Admitting: Emergency Medicine

## 2018-07-03 DIAGNOSIS — Z23 Encounter for immunization: Secondary | ICD-10-CM

## 2018-07-03 DIAGNOSIS — J4540 Moderate persistent asthma, uncomplicated: Secondary | ICD-10-CM

## 2018-07-03 MED ORDER — MEPOLIZUMAB 100 MG ~~LOC~~ SOLR
100.0000 mg | SUBCUTANEOUS | Status: DC
  Administered 2018-07-03: 100 mg via SUBCUTANEOUS

## 2018-07-03 NOTE — Telephone Encounter (Signed)
Nucala came in today, pt saw RB, Ria Comment gave her her inj.. Pt has moved here from [place]. Pt is cont. Her injs here. She was perviously getting her shots at a Dr's office in [place]. Nothing further needed.

## 2018-07-03 NOTE — Assessment & Plan Note (Signed)
She did benefit from the re-addition of scheduled Dulera.  I suspect she will need to stay on this medication to have a maximal effective therapy even while on the Nucala.  It may be possible as we go forward to change her to an ICS depending on her progress.  Please continue Dulera 2 puffs twice daily as you have been taking it.  Remember to rinse and gargle after using. Depending on your progress over the next several months we may decide to transition the [facility] to an alternative Keep your albuterol available to use 2 puffs or 1 nebulizer treatment up to every 4 hours if you were to needed for shortness of breath, chest tightness, wheezing. We will continue Nucala here in [place]. Flu shot today. Your pneumonia shot is up-to-date.  You will need it again after age 51. Follow with Dr Lamonte Sakai in 6 months or sooner if you have any problems

## 2018-07-03 NOTE — Patient Instructions (Signed)
Please continue Dulera 2 puffs twice daily as you have been taking it.  Remember to rinse and gargle after using. Depending on your progress over the next several months we may decide to transition the [facility] to an alternative Keep your albuterol available to use 2 puffs or 1 nebulizer treatment up to every 4 hours if you were to needed for shortness of breath, chest tightness, wheezing. We will continue Nucala here in [place]. Flu shot today. Your pneumonia shot is up-to-date.  You will need it again after age 39. Follow with Dr Lamonte Sakai in 6 months or sooner if you have any problems

## 2018-07-03 NOTE — Addendum Note (Signed)
Addended by: Desmond Dike C on: 07/03/2018 11:55 AM   Modules accepted: Orders

## 2018-07-03 NOTE — Telephone Encounter (Signed)
1 vial Arrival Date:07/03/18 Lot #: [id number] Exp Date:12/2021

## 2018-07-03 NOTE — Progress Notes (Signed)
Subjective:    Patient ID: Ana Hughes, female    DOB: September 23, 1950, 67 y.o.   MRN: [id number]  Asthma  There is no cough, shortness of breath or wheezing. Pertinent negatives include no ear pain, fever, headaches, postnasal drip, rhinorrhea, sneezing, sore throat or trouble swallowing. Her past medical history is significant for asthma.   This is a new consult visit for 67 year old never smoker who carries a history of asthma.  She is currently managed on Dulera and Nucala, but notes that she has only been using her Dulera prn.  She just moved from [place], [place].  Her asthma was originally diagnosed about 5-6 yrs ago, was having episodes of freq bronchitis. Seemed to clinically benefit from albuterol, prednisone. She had obstruction confirmed on PFT, was treated with [facility] with some benefit. Most recent.y started on [place]. She had significant clinical benefit > less flaring, prednisone, day-to-day breathing.  Her albuterol use > minimal, but she has started using her Dulera prn instead of scheduled.   Outside records reviewed  > WBC 5.5, 4.2% eosinophils (05/12/2015)  Most recent spiro 03/26/18 >> FEV1 1.27 L (55% predicted), FVC 1.80 L (59% predicted), ratio 71%.  There is curve on her flow volume loop. Most recent TLC 3.39 (72% predicted) 02/12/2017 Most recent DLCO (uncorrected) 15.1 (67% predicted) 02/12/2017  ROV 07/03/18 --Ana Hughes is 64, follows up today for her history of asthma.  She been previously followed in [place].  She had been managed on Dulera, but stopped taking it reliably when she had a significant improvement on Nucala.  We plan to continue the Nucala in [place], restart the [facility] to see if she got any additional clinical benefit. She benefited - has had better air movement, less dyspnea. Rare albuterol use - hasn't used in 6 months.    Review of Systems  Constitutional: Negative for fever and unexpected weight change.  HENT: Negative for congestion,  dental problem, ear pain, nosebleeds, postnasal drip, rhinorrhea, sinus pressure, sneezing, sore throat and trouble swallowing.   Eyes: Negative for redness and itching.  Respiratory: Negative for cough, chest tightness, shortness of breath and wheezing.   Cardiovascular: Negative for palpitations and leg swelling.  Gastrointestinal: Negative for nausea and vomiting.  Genitourinary: Negative for dysuria.  Musculoskeletal: Negative for joint swelling.  Skin: Negative for rash.  Neurological: Negative for headaches.  Hematological: Does not bruise/bleed easily.  Psychiatric/Behavioral: Negative for dysphoric mood. The patient is not nervous/anxious.    Past Medical History:  Diagnosis Date  . Asthma   . Glaucoma   . Hypertension      No family history on file.   Social History   Socioeconomic History  . Marital status: Widowed    Spouse name: Not on file  . Number of children: Not on file  . Years of education: Not on file  . Highest education level: Not on file  Occupational History  . Not on file  Social Needs  . Financial resource strain: Not on file  . Food insecurity:    Worry: Not on file    Inability: Not on file  . Transportation needs:    Medical: Not on file    Non-medical: Not on file  Tobacco Use  . Smoking status: Never Smoker  . Smokeless tobacco: Never Used  Substance and Sexual Activity  . Alcohol use: Not on file  . Drug use: Not on file  . Sexual activity: Not on file  Lifestyle  . Physical activity:  Days per week: Not on file    Minutes per session: Not on file  . Stress: Not on file  Relationships  . Social connections:    Talks on phone: Not on file    Gets together: Not on file    Attends religious service: Not on file    Active member of club or organization: Not on file    Attends meetings of clubs or organizations: Not on file    Relationship status: Not on file  . Intimate partner violence:    Fear of current or ex partner: Not on  file    Emotionally abused: Not on file    Physically abused: Not on file    Forced sexual activity: Not on file  Other Topics Concern  . Not on file  Social History Narrative  . Not on file     Allergies  Allergen Reactions  . [place] [Brinzolamide-Brimonidine]      Outpatient Medications Prior to Visit  Medication Sig Dispense Refill  . albuterol (PROVENTIL HFA;VENTOLIN HFA) 108 (90 Base) MCG/ACT inhaler Inhale 2 puffs into the lungs every 4 (four) hours as needed for wheezing or shortness of breath. 1 Inhaler 5  . albuterol (PROVENTIL) (2.5 MG/3ML) 0.083% nebulizer solution Take 2.5 mg by nebulization every 6 (six) hours as needed for wheezing or shortness of breath.    . bimatoprost (LUMIGAN) 0.01 % SOLN Place 1 drop into both eyes at bedtime.    . dorzolamide (TRUSOPT) 2 % ophthalmic solution Place 1 drop into both eyes 3 (three) times daily.    . ergocalciferol (VITAMIN D2) 50000 units capsule Take 50,000 Units by mouth once a week.    Marland Kitchen FLUoxetine (PROZAC) 40 MG capsule Take 40 mg by mouth daily.    Marland Kitchen losartan (COZAAR) 50 MG tablet Take 50 mg by mouth daily.    . Mepolizumab (NUCALA) 100 MG/ML SOAJ Inject 100 mg into the skin every 28 (twenty-eight) days.    . mometasone-formoterol (DULERA) 200-5 MCG/ACT AERO Inhale 2 puffs into the lungs 2 (two) times daily.     No facility-administered medications prior to visit.         Objective:   Physical Exam Vitals:   07/03/18 1125  BP: 122/88  Pulse: 74  SpO2: 96%  Weight: 94.3 kg  Height: 5' 2.25" (1.581 m)   Gen: Pleasant, overwt woman, in no distress,  normal affect  ENT: No lesions,  mouth clear,  oropharynx clear, no postnasal drip  Neck: No JVD, no stridor  Lungs: No use of accessory muscles, clear B  Cardiovascular: RRR, heart sounds normal, no murmur or gallops, trace peripheral edema  Musculoskeletal: No deformities, no cyanosis or clubbing  Neuro: alert, non focal  Skin: Warm, no lesions or rash      Assessment & Plan:  Asthma She did benefit from the re-addition of scheduled Dulera.  I suspect she will need to stay on this medication to have a maximal effective therapy even while on the Nucala.  It may be possible as we go forward to change her to an ICS depending on her progress.  Please continue Dulera 2 puffs twice daily as you have been taking it.  Remember to rinse and gargle after using. Depending on your progress over the next several months we may decide to transition the [facility] to an alternative Keep your albuterol available to use 2 puffs or 1 nebulizer treatment up to every 4 hours if you were to needed for shortness of  breath, chest tightness, wheezing. We will continue Nucala here in [place]. Flu shot today. Your pneumonia shot is up-to-date.  You will need it again after age 36. Follow with Dr Lamonte Sakai in 6 months or sooner if you have any problems  Baltazar Apo, MD, PhD 07/03/2018, 11:47 AM [facility] [phone] or if no answer [phone]

## 2018-07-03 NOTE — Telephone Encounter (Signed)
My mistake, [place] moved here [place]Marland Kitchen

## 2018-07-31 ENCOUNTER — Telehealth: Payer: Self-pay | Admitting: Emergency Medicine

## 2018-07-31 NOTE — Telephone Encounter (Signed)
1 vial Arrival Date:07/31/18 Lot #: [id number] Exp Date:12/2021

## 2018-08-04 ENCOUNTER — Ambulatory Visit: Payer: Federal, State, Local not specified - PPO

## 2018-08-05 ENCOUNTER — Ambulatory Visit (INDEPENDENT_AMBULATORY_CARE_PROVIDER_SITE_OTHER): Payer: Medicare Other

## 2018-08-05 ENCOUNTER — Telehealth: Payer: Self-pay | Admitting: Emergency Medicine

## 2018-08-05 DIAGNOSIS — J4540 Moderate persistent asthma, uncomplicated: Secondary | ICD-10-CM | POA: Diagnosis not present

## 2018-08-05 MED ORDER — MOMETASONE FURO-FORMOTEROL FUM 200-5 MCG/ACT IN AERO: 2.0000 | INHALATION_SPRAY | Freq: Two times a day (BID) | RESPIRATORY_TRACT | 0 refills | Status: DC

## 2018-08-05 MED ORDER — MEPOLIZUMAB 100 MG ~~LOC~~ SOLR
100.0000 mg | Freq: Once | SUBCUTANEOUS | Status: AC
  Administered 2018-08-05: 100 mg via SUBCUTANEOUS

## 2018-08-05 NOTE — Telephone Encounter (Signed)
Called and spoke with patient she stated that she is needing a refill for a month sent to [employer] for the [facility]. Then she is needing a 3 month supply sent over [facility]. Refills sent in. Nothing further needed.

## 2018-08-08 ENCOUNTER — Telehealth: Payer: Self-pay | Admitting: Emergency Medicine

## 2018-08-08 MED ORDER — MOMETASONE FURO-FORMOTEROL FUM 200-5 MCG/ACT IN AERO: 2.0000 | INHALATION_SPRAY | Freq: Two times a day (BID) | RESPIRATORY_TRACT | 0 refills | Status: DC

## 2018-08-08 NOTE — Telephone Encounter (Signed)
Initiated PA via [email or web address] Key: [id number]  PA has been sent to plan, and a determination is expected within 3 business days.   1 sample of Dulera 200 has been left up front for pt to pick up.  Pt aware of the above.  Will follow up on determination.

## 2018-08-11 NOTE — Telephone Encounter (Signed)
Received the fax for the PA to be performed for pt's Dulera 262mcg.  While looking at the PA questions, it states has patient had an inadequate response to Advair Diskus or Advair HFA?  Looking through pt's meds that she has tried in the past and I am not seeing Advair in the list of pt's previous meds.  From the PA, if pt has not tried/failed Advair Diskus or Advair HFA, coverage for the [facility] will not be approved.  Dr. Lamonte Sakai, please advise on this for pt. Thanks!

## 2018-08-11 NOTE — Telephone Encounter (Signed)
Checked [email or web address] to see if the status of the PA had been updated and there is a message in there that the PA was cancelled.   Called [facility] to check to see why the PA was cancelled and while speaking with a representative to see while the PA was cancelled, they told me they are not seeing anything where the PA was even started for pt's med. I stated to them that the PA was initiated Friday, 08/08/18 and when I went to check the status of the PA, I saw a notification that the PA was cancelled.  [facility] is going to refax [place] a PA request for pt's [facility] so we can reinitiate the PA. Will await the fax.

## 2018-08-13 NOTE — Telephone Encounter (Signed)
RB please advise, thank you.

## 2018-08-13 NOTE — Telephone Encounter (Signed)
Called patient, unable to reach left message to give us a call back. 

## 2018-08-13 NOTE — Telephone Encounter (Signed)
It would be ok to try Advair 115/21 mcg, 2 puffs bid as an alternative to see if she gets the same benefit

## 2018-08-15 NOTE — Telephone Encounter (Signed)
Attempted to call pt but unable to reach her. Left message for pt to return call. 

## 2018-08-18 MED ORDER — MOMETASONE FURO-FORMOTEROL FUM 200-5 MCG/ACT IN AERO: 2.0000 | INHALATION_SPRAY | Freq: Two times a day (BID) | RESPIRATORY_TRACT | 3 refills | Status: DC

## 2018-08-18 NOTE — Telephone Encounter (Signed)
Patient returned phone call; pt contact [phone]

## 2018-08-18 NOTE — Telephone Encounter (Signed)
Called and spoke with pt letting her know that [facility] needed to have a PA done due to it not being a covered inhaler with her insurance.  When I stated that to pt, pt stated she has tried other inhalers from a previous doctor Dr. Virgie Dad who is no longer at the practice she goes to but Dr. Rogers Blocker is now at that practice. Pt stated she has tried advair and symbicort and after those was placed on the dulera which works the best for her.  I stated to pt since she has tried advair, we have the inhalers covered that she needed to have tried/failed. Stated to pt we would do the PA and call her back once we received determination. Pt expressed understanding.  Tried initiating PA via cmm but after putting in information, got a response saying that all was resolved. Called [facility] and spoke with Palmer to initiate PA. PA has been approved with case ID #[id number].  Called and spoke with pt letting her know this had been done. Pt expressed understanding. Nothing further needed.

## 2018-08-18 NOTE — Telephone Encounter (Signed)
Attempted to call pt but unable to reach her. Left message for pt to return call. 

## 2018-08-26 ENCOUNTER — Telehealth: Payer: Self-pay | Admitting: Emergency Medicine

## 2018-08-27 NOTE — Telephone Encounter (Signed)
1 vial Arrival Date:08/27/18 Lot #: [id number] Exp Date:01/2022 

## 2018-08-27 NOTE — Telephone Encounter (Signed)
1 Vial Order Date: 06/26/18 Shipping Date: 06/26/18

## 2018-09-03 ENCOUNTER — Ambulatory Visit (INDEPENDENT_AMBULATORY_CARE_PROVIDER_SITE_OTHER): Payer: Medicare Other

## 2018-09-03 DIAGNOSIS — J4540 Moderate persistent asthma, uncomplicated: Secondary | ICD-10-CM

## 2018-09-05 MED ORDER — MEPOLIZUMAB 100 MG ~~LOC~~ SOLR
100.0000 mg | Freq: Once | SUBCUTANEOUS | Status: AC
  Administered 2018-09-03: 100 mg via SUBCUTANEOUS

## 2018-09-16 DIAGNOSIS — H401122 Primary open-angle glaucoma, left eye, moderate stage: Secondary | ICD-10-CM | POA: Diagnosis not present

## 2018-09-16 DIAGNOSIS — H401111 Primary open-angle glaucoma, right eye, mild stage: Secondary | ICD-10-CM | POA: Diagnosis not present

## 2018-09-29 DIAGNOSIS — M7752 Other enthesopathy of left foot: Secondary | ICD-10-CM | POA: Diagnosis not present

## 2018-09-29 DIAGNOSIS — J069 Acute upper respiratory infection, unspecified: Secondary | ICD-10-CM | POA: Diagnosis not present

## 2018-09-29 DIAGNOSIS — H409 Unspecified glaucoma: Secondary | ICD-10-CM | POA: Diagnosis not present

## 2018-09-29 DIAGNOSIS — H9319 Tinnitus, unspecified ear: Secondary | ICD-10-CM | POA: Diagnosis not present

## 2018-09-29 DIAGNOSIS — J449 Chronic obstructive pulmonary disease, unspecified: Secondary | ICD-10-CM | POA: Diagnosis not present

## 2018-09-29 DIAGNOSIS — M25471 Effusion, right ankle: Secondary | ICD-10-CM | POA: Diagnosis not present

## 2018-09-29 DIAGNOSIS — I10 Essential (primary) hypertension: Secondary | ICD-10-CM | POA: Diagnosis not present

## 2018-10-01 ENCOUNTER — Telehealth: Payer: Self-pay | Admitting: Emergency Medicine

## 2018-10-02 ENCOUNTER — Encounter: Payer: Self-pay | Admitting: Family Medicine

## 2018-10-02 DIAGNOSIS — J449 Chronic obstructive pulmonary disease, unspecified: Secondary | ICD-10-CM | POA: Diagnosis not present

## 2018-10-02 DIAGNOSIS — Z136 Encounter for screening for cardiovascular disorders: Secondary | ICD-10-CM | POA: Diagnosis not present

## 2018-10-02 DIAGNOSIS — M25471 Effusion, right ankle: Secondary | ICD-10-CM | POA: Diagnosis not present

## 2018-10-02 DIAGNOSIS — Z1211 Encounter for screening for malignant neoplasm of colon: Secondary | ICD-10-CM | POA: Diagnosis not present

## 2018-10-02 NOTE — Telephone Encounter (Signed)
1 vial Arrival Date:10/02/2018 Lot #: [id number] TTCN:[id number]

## 2018-10-02 NOTE — Telephone Encounter (Signed)
1 Vial Order Date: 10/01/2018 Shipping Date: 10/01/2018

## 2018-10-07 ENCOUNTER — Other Ambulatory Visit: Payer: Self-pay | Admitting: Family Medicine

## 2018-10-07 ENCOUNTER — Ambulatory Visit: Payer: Medicare Other

## 2018-10-07 ENCOUNTER — Ambulatory Visit (INDEPENDENT_AMBULATORY_CARE_PROVIDER_SITE_OTHER): Payer: Medicare Other | Admitting: Emergency Medicine

## 2018-10-07 ENCOUNTER — Encounter: Payer: Self-pay | Admitting: Emergency Medicine

## 2018-10-07 DIAGNOSIS — J4540 Moderate persistent asthma, uncomplicated: Secondary | ICD-10-CM

## 2018-10-07 DIAGNOSIS — Z1231 Encounter for screening mammogram for malignant neoplasm of breast: Secondary | ICD-10-CM

## 2018-10-07 NOTE — Assessment & Plan Note (Signed)
Doing well since she got back on her Dulera.  Plan to continue same.  Continue her Nucala as well.  At her next visit I will consider checking a CBC with differential, IgE.  Flu shot up-to-date, pneumonia shot up-to-date but I need to confirm the exact time it was given.  Please continue your Dulera 2 puffs twice a day as you have been taking it.  Rinse and gargle after using. Keep albuterol available to use 2 puffs if needed for shortness of breath Continue your Nucala treatments per your existing schedule. Flu shot is up-to-date.  Pneumonia shot is up-to-date and we will get the exact records from [place].  You will need to have a repeat pneumonia shot at some point after age 39, probably in the next 3 or 4 years. Continue your lisinopril as you are taking it.  If we feel that your asthma control is worsening or you are having more cough then we may consider recommending a change. Follow with Dr Lamonte Sakai in 6 months or sooner if you have any problems

## 2018-10-07 NOTE — Patient Instructions (Signed)
Please continue your Dulera 2 puffs twice a day as you have been taking it.  Rinse and gargle after using. Keep albuterol available to use 2 puffs if needed for shortness of breath Continue your Nucala treatments per your existing schedule. Flu shot is up-to-date.  Pneumonia shot is up-to-date and we will get the exact records from [place].  You will need to have a repeat pneumonia shot at some point after age 68, probably in the next 3 or 4 years. Continue your lisinopril as you are taking it.  If we feel that your asthma control is worsening or you are having more cough then we may consider recommending a change. Follow with Ana Hughes in 6 months or sooner if you have any problems

## 2018-10-07 NOTE — Progress Notes (Signed)
Subjective:    Patient ID: Ana Hughes, female    DOB: Oct 19, 1950, 68 y.o.   MRN: [id number]  Asthma  There is no cough, shortness of breath or wheezing. Pertinent negatives include no ear pain, fever, headaches, postnasal drip, rhinorrhea, sneezing, sore throat or trouble swallowing. Her past medical history is significant for asthma.   This is a new consult visit for 68 year old never smoker who carries a history of asthma.  She is currently managed on Dulera and Nucala, but notes that she has only been using her Dulera prn.  She just moved from [place], [place].  Her asthma was originally diagnosed about 5-6 yrs ago, was having episodes of freq bronchitis. Seemed to clinically benefit from albuterol, prednisone. She had obstruction confirmed on PFT, was treated with [facility] with some benefit. Most recent.y started on [place]. She had significant clinical benefit > less flaring, prednisone, day-to-day breathing.  Her albuterol use > minimal, but she has started using her Dulera prn instead of scheduled.   Outside records reviewed  > WBC 5.5, 4.2% eosinophils (05/12/2015)  Most recent spiro 03/26/18 >> FEV1 1.27 L (55% predicted), FVC 1.80 L (59% predicted), ratio 71%.  There is curve on her flow volume loop. Most recent TLC 3.39 (72% predicted) 02/12/2017 Most recent DLCO (uncorrected) 15.1 (67% predicted) 02/12/2017  ROV 07/03/18 --Ana Hughes is 68, follows up today for her history of asthma.  She been previously followed in [place].  She had been managed on Dulera, but stopped taking it reliably when she had a significant improvement on Nucala.  We plan to continue the Nucala in [place], restart the [facility] to see if she got any additional clinical benefit. She benefited - has had better air movement, less dyspnea. Rare albuterol use - hasn't used in 6 months.   ROV 10/07/18 --follow-up visit today for 68 year old woman with a history of asthma she is currently managed on [place].she is doing well, less albuterol use, now back on the [facility].  No exacerbations since last time.  Flu shot up-to-date.  She had the pneumonia shot at some point prior to age 8 in [place], we will try to get the records.  She is back on lisinopril, dose is actually been decreased.  No cough or breakthrough asthma symptoms on this medication.   Review of Systems  Constitutional: Negative for fever and unexpected weight change.  HENT: Negative for congestion, dental problem, ear pain, nosebleeds, postnasal drip, rhinorrhea, sinus pressure, sneezing, sore throat and trouble swallowing.   Eyes: Negative for redness and itching.  Respiratory: Negative for cough, chest tightness, shortness of breath and wheezing.   Cardiovascular: Negative for palpitations and leg swelling.  Gastrointestinal: Negative for nausea and vomiting.  Genitourinary: Negative for dysuria.  Musculoskeletal: Negative for joint swelling.  Skin: Negative for rash.  Neurological: Negative for headaches.  Hematological: Does not bruise/bleed easily.  Psychiatric/Behavioral: Negative for dysphoric mood. The patient is not nervous/anxious.    Past Medical History:  Diagnosis Date  . Asthma   . Glaucoma   . Hypertension      No family history on file.   Social History   Socioeconomic History  . Marital status: Widowed    Spouse name: Not on file  . Number of children: Not on file  . Years of education: Not on file  . Highest education level: Not on file  Occupational History  . Not on file  Social Needs  . Financial resource strain: Not on file  .  Food insecurity:    Worry: Not on file    Inability: Not on file  . Transportation needs:    Medical: Not on file    Non-medical: Not on file  Tobacco Use  . Smoking status: Never Smoker  . Smokeless tobacco: Never Used  Substance and Sexual Activity  . Alcohol use: Not on file  . Drug use: Not on file  . Sexual activity: Not on file  Lifestyle  .  Physical activity:    Days per week: Not on file    Minutes per session: Not on file  . Stress: Not on file  Relationships  . Social connections:    Talks on phone: Not on file    Gets together: Not on file    Attends religious service: Not on file    Active member of club or organization: Not on file    Attends meetings of clubs or organizations: Not on file    Relationship status: Not on file  . Intimate partner violence:    Fear of current or ex partner: Not on file    Emotionally abused: Not on file    Physically abused: Not on file    Forced sexual activity: Not on file  Other Topics Concern  . Not on file  Social History Narrative  . Not on file     Allergies  Allergen Reactions  . [place] [Brinzolamide-Brimonidine]      Outpatient Medications Prior to Visit  Medication Sig Dispense Refill  . albuterol (PROVENTIL HFA;VENTOLIN HFA) 108 (90 Base) MCG/ACT inhaler Inhale 2 puffs into the lungs every 4 (four) hours as needed for wheezing or shortness of breath. 1 Inhaler 5  . albuterol (PROVENTIL) (2.5 MG/3ML) 0.083% nebulizer solution Take 2.5 mg by nebulization every 6 (six) hours as needed for wheezing or shortness of breath.    . bimatoprost (LUMIGAN) 0.01 % SOLN Place 1 drop into both eyes at bedtime.    . dorzolamide (TRUSOPT) 2 % ophthalmic solution Place 1 drop into both eyes 3 (three) times daily.    . ergocalciferol (VITAMIN D2) 50000 units capsule Take 50,000 Units by mouth once a week.    Marland Kitchen FLUoxetine (PROZAC) 40 MG capsule Take 40 mg by mouth daily.    Marland Kitchen lisinopril (PRINIVIL,ZESTRIL) 2.5 MG tablet Take 2.5 mg by mouth daily.    . mometasone-formoterol (DULERA) 200-5 MCG/ACT AERO Inhale 2 puffs into the lungs 2 (two) times daily. 3 Inhaler 3  . losartan (COZAAR) 50 MG tablet Take 50 mg by mouth daily.     No facility-administered medications prior to visit.         Objective:   Physical Exam Vitals:   10/07/18 0935  BP: 118/80  Pulse: 72  SpO2: 98%    Weight: 202 lb (91.6 kg)  Height: 5\' 2"  (1.575 m)   Gen: Pleasant, overwt woman, in no distress,  normal affect  ENT: No lesions,  mouth clear,  oropharynx clear, no postnasal drip  Neck: No JVD, no stridor  Lungs: No use of accessory muscles, clear B  Cardiovascular: RRR, heart sounds normal, no murmur or gallops, trace peripheral edema  Musculoskeletal: No deformities, no cyanosis or clubbing  Neuro: alert, non focal  Skin: Warm, no lesions or rash     Assessment & Plan:  Asthma Doing well since she got back on her Dulera.  Plan to continue same.  Continue her Nucala as well.  At her next visit I will consider checking a CBC with  differential, IgE.  Flu shot up-to-date, pneumonia shot up-to-date but I need to confirm the exact time it was given.  Please continue your Dulera 2 puffs twice a day as you have been taking it.  Rinse and gargle after using. Keep albuterol available to use 2 puffs if needed for shortness of breath Continue your Nucala treatments per your existing schedule. Flu shot is up-to-date.  Pneumonia shot is up-to-date and we will get the exact records from [place].  You will need to have a repeat pneumonia shot at some point after age 46, probably in the next 3 or 4 years. Continue your lisinopril as you are taking it.  If we feel that your asthma control is worsening or you are having more cough then we may consider recommending a change. Follow with Dr Lamonte Sakai in 6 months or sooner if you have any problems  Baltazar Apo, MD, PhD 10/07/2018, 9:54 AM [facility] [phone] or if no answer [phone]

## 2018-10-08 MED ORDER — MEPOLIZUMAB 100 MG ~~LOC~~ SOLR
100.0000 mg | Freq: Once | SUBCUTANEOUS | Status: AC
  Administered 2018-10-08: 100 mg via SUBCUTANEOUS

## 2018-10-14 ENCOUNTER — Telehealth: Payer: Self-pay | Admitting: Emergency Medicine

## 2018-10-14 ENCOUNTER — Encounter: Payer: Self-pay | Admitting: Pulmonary Disease

## 2018-10-14 ENCOUNTER — Ambulatory Visit (INDEPENDENT_AMBULATORY_CARE_PROVIDER_SITE_OTHER): Payer: Medicare Other | Admitting: Pulmonary Disease

## 2018-10-14 VITALS — BP 124/76 | HR 69 | Temp 97.1°F | Ht 62.0 in | Wt 203.0 lb

## 2018-10-14 DIAGNOSIS — J4540 Moderate persistent asthma, uncomplicated: Secondary | ICD-10-CM

## 2018-10-14 LAB — NITRIC OXIDE: Nitric Oxide: 18

## 2018-10-14 MED ORDER — ALBUTEROL SULFATE HFA 108 (90 BASE) MCG/ACT IN AERS: 2.0000 | INHALATION_SPRAY | RESPIRATORY_TRACT | 5 refills | Status: DC | PRN

## 2018-10-14 MED ORDER — LEVALBUTEROL HCL 0.63 MG/3ML IN NEBU
0.6300 mg | INHALATION_SOLUTION | RESPIRATORY_TRACT | Status: AC
  Administered 2018-10-14: 0.63 mg via RESPIRATORY_TRACT

## 2018-10-14 MED ORDER — ALBUTEROL SULFATE (2.5 MG/3ML) 0.083% IN NEBU: 2.5000 mg | INHALATION_SOLUTION | Freq: Four times a day (QID) | RESPIRATORY_TRACT | 3 refills | Status: DC | PRN

## 2018-10-14 NOTE — Progress Notes (Signed)
@Patient  ID: Ana Hughes, female    DOB: May 17, 1951, 68 y.o.   MRN: [id number]  Chief Complaint  Patient presents with  . Acute Visit    Wheezing    Referring provider: Buzzy Han*  HPI:  68 year old female never smoker followed in our office for asthma  PMH: Hypertension, glaucoma Smoker/ Smoking History: Never smoker Maintenance: Dulera 200, Nucala Pt of: Dr. Lamonte Sakai  10/14/2018  - Visit   68 year old female never smoker presenting to our office today for an acute visit.  She reported that symptoms started last week of cough with clear mucus, increased congestion, ears feel full, increased shortness of breath and wheezing at night.  Patient presented to primary care on 10/11/2018 and was prescribed a Z-Pak as well as Ladona Ridgel for management of cough.  Patient does not believe that her symptoms are improving.  Which is why she is presenting today.  Patient does not have a rescue inhaler or albuterol nebulized medications at this time.  She reports that she has misplaced them since moving here from [place].  Grandson had cough last week  Known sick contact is grandson    Tests:  Outside records reviewed  > WBC 5.5, 4.2% eosinophils (05/12/2015)  Most recent spiro 03/26/18 >> FEV1 1.27 L (55% predicted), FVC 1.80 L (59% predicted), ratio 71%.  There is curve on her flow volume loop. Most recent TLC 3.39 (72% predicted) 02/12/2017 Most recent DLCO (uncorrected) 15.1 (67% predicted) 02/12/2017  FENO:  Lab Results  Component Value Date   NITRICOXIDE 18 10/14/2018    PFT: No flowsheet data found.  Imaging: No results found.    Specialty Problems      Pulmonary Problems   Asthma      Allergies  Allergen Reactions  . [place] [Brinzolamide-Brimonidine]     Immunization History  Administered Date(s) Administered  . Influenza, High Dose Seasonal PF 07/03/2018    Past Medical History:  Diagnosis Date  . Asthma   . Glaucoma   . Hypertension      Tobacco History: Social History   Tobacco Use  Smoking Status Never Smoker  Smokeless Tobacco Never Used   Counseling given: Not Answered  Continue to not smoke  Outpatient Encounter Medications as of 10/14/2018  Medication Sig  . azithromycin (ZITHROMAX) 250 MG tablet Take 250 mg by mouth daily. Take 2 pills day 1 then 1 pill for 4 days  . benzonatate (TESSALON PERLES) 100 MG capsule Take by mouth 3 (three) times daily as needed for cough.  . bimatoprost (LUMIGAN) 0.01 % SOLN Place 1 drop into both eyes at bedtime.  . dorzolamide (TRUSOPT) 2 % ophthalmic solution Place 1 drop into both eyes 3 (three) times daily.  . ergocalciferol (VITAMIN D2) 50000 units capsule Take 50,000 Units by mouth once a week.  Marland Kitchen FLUoxetine (PROZAC) 40 MG capsule Take 40 mg by mouth daily.  Marland Kitchen lisinopril (PRINIVIL,ZESTRIL) 2.5 MG tablet Take 2.5 mg by mouth daily.  . mometasone-formoterol (DULERA) 200-5 MCG/ACT AERO Inhale 2 puffs into the lungs 2 (two) times daily.  Marland Kitchen albuterol (PROVENTIL HFA;VENTOLIN HFA) 108 (90 Base) MCG/ACT inhaler Inhale 2 puffs into the lungs every 4 (four) hours as needed for wheezing or shortness of breath.  Marland Kitchen albuterol (PROVENTIL) (2.5 MG/3ML) 0.083% nebulizer solution Take 3 mLs (2.5 mg total) by nebulization every 6 (six) hours as needed for wheezing or shortness of breath.  . [DISCONTINUED] albuterol (PROVENTIL HFA;VENTOLIN HFA) 108 (90 Base) MCG/ACT inhaler Inhale 2 puffs into the lungs  every 4 (four) hours as needed for wheezing or shortness of breath. (Patient not taking: Reported on 10/14/2018)  . [DISCONTINUED] albuterol (PROVENTIL) (2.5 MG/3ML) 0.083% nebulizer solution Take 2.5 mg by nebulization every 6 (six) hours as needed for wheezing or shortness of breath.  . [EXPIRED] levalbuterol (XOPENEX) nebulizer solution 0.63 mg    No facility-administered encounter medications on file as of 10/14/2018.      Review of Systems  Review of Systems  Constitutional: Positive  for fatigue. Negative for fever.  HENT: Positive for congestion (clear drainage ). Negative for sinus pressure and sinus pain.   Respiratory: Positive for cough (clear mucous ), shortness of breath and wheezing.   Cardiovascular: Negative for chest pain and palpitations.  Gastrointestinal: Negative for diarrhea, nausea and vomiting.  Neurological: Positive for headaches (occasionally ).     Physical Exam  BP 124/76 (BP Location: Left Arm, Cuff Size: Normal)   Pulse 69   Temp (!) 97.1 F (36.2 C) (Axillary)   Ht 5\' 2"  (1.575 m)   Wt 203 lb (92.1 kg)   SpO2 95%   BMI 37.13 kg/m   Wt Readings from Last 5 Encounters:  10/14/18 203 lb (92.1 kg)  10/07/18 202 lb (91.6 kg)  07/03/18 208 lb (94.3 kg)  05/26/18 214 lb (97.1 kg)    Physical Exam  Constitutional: She is oriented to person, place, and time and well-developed, well-nourished, and in no distress. No distress.  HENT:  Head: Normocephalic and atraumatic.  Right Ear: Hearing, external ear and ear canal normal.  Left Ear: Hearing, external ear and ear canal normal.  Nose: Mucosal edema and rhinorrhea present. Right sinus exhibits no maxillary sinus tenderness and no frontal sinus tenderness. Left sinus exhibits no maxillary sinus tenderness and no frontal sinus tenderness.  Mouth/Throat: Uvula is midline and oropharynx is clear and moist. No oropharyngeal exudate.  + TMs with effusion without infection bilaterally + Postnasal drip  Eyes: Pupils are equal, round, and reactive to light.  Neck: Normal range of motion. Neck supple.  Cardiovascular: Normal rate, regular rhythm and normal heart sounds.  Pulmonary/Chest: Effort normal. No accessory muscle usage. No respiratory distress. She has no decreased breath sounds. She has wheezes (Inspiratory and expiratory wheezes). She has rhonchi (Expiratory rhonchi, clears with coughing).  Musculoskeletal: Normal range of motion.        General: No edema.  Lymphadenopathy:    She has  no cervical adenopathy.  Neurological: She is alert and oriented to person, place, and time. Gait normal.  Skin: Skin is warm and dry. She is not diaphoretic. No erythema.  Psychiatric: Mood, memory, affect and judgment normal.  Nursing note and vitals reviewed.   10/14/2018 - FENO - 18 10/14/2018 - xopenex breathing treatment - resolved insp wheeze, persistent but improved exp wheeze, no rhonchi  Lab Results:  CBC No results found for: WBC, RBC, HGB, HCT, PLT, MCV, MCH, MCHC, RDW, LYMPHSABS, MONOABS, EOSABS, BASOSABS  BMET No results found for: NA, K, CL, CO2, GLUCOSE, BUN, CREATININE, CALCIUM, GFRNONAA, GFRAA  BNP No results found for: BNP  ProBNP No results found for: PROBNP    Assessment & Plan:    Asthma Assessment: Continued asthma exacerbation today in office BMI 37 Maintained on Dulera 200 twice daily Inspiratory and expiratory wheezes on exam today Expiratory rhonchi which clears with coughing Resolved inspiratory wheeze, improved expiratory wheeze after Xopenex breathing treatment in office today Completing a Z-Pak at this time from primary care  Plan: Prednisone taper today Continue  Z-Pak as prescribed Added Tessalon 200 mg every 8 hours Continue Dulera 200  Refilled albuterol nebs  Refilled rescue inhaler  Continue Nucala  Follow up in 2-4 weeks    Lauraine Rinne, NP 10/14/2018   This appointment was 30 min long with over 50% of the time in direct face-to-face patient care, assessment, plan of care, and follow-up.

## 2018-10-14 NOTE — Assessment & Plan Note (Addendum)
Assessment: Continued asthma exacerbation today in office BMI 37 Maintained on Dulera 200 twice daily Inspiratory and expiratory wheezes on exam today Expiratory rhonchi which clears with coughing Resolved inspiratory wheeze, improved expiratory wheeze after Xopenex breathing treatment in office today Completing a Z-Pak at this time from primary care  Plan: Prednisone taper today Continue Z-Pak as prescribed Added Tessalon 200 mg every 8 hours Continue Dulera 200  Refilled albuterol nebs  Refilled rescue inhaler  Continue Nucala  Follow up in 2-4 weeks

## 2018-10-14 NOTE — Progress Notes (Signed)
Discussed results with patient in office.  Nothing further is needed at this time.  Beacher Every FNP  

## 2018-10-14 NOTE — Patient Instructions (Addendum)
FENO today   Xopenex neb today   Prednisone 10mg  tablet  >>>4 tabs for 2 days, then 3 tabs for 2 days, 2 tabs for 2 days, then 1 tab for 2 days, then stop >>>take with food  >>>take in the morning   Tessalon 200mg  every 8 hours as needed for cough   Dulera 200 >>> 2 puffs in the morning right when you wake up, rinse out your mouth after use, 12 hours later 2 puffs, rinse after use >>> Take this daily, no matter what >>> This is not a rescue inhaler   We have refilled your rescue inhaler We have refilled your albuterol nebulized medications  Only use your albuterol as a rescue medication to be used if you can't catch your breath by resting or doing a relaxed purse lip breathing pattern.  - The less you use it, the better it will work when you need it. - Ok to use up to 2 puffs  every 4 hours if you must but call for immediate appointment if use goes up over your usual need - Don't leave home without it !!  (think of it like the spare tire for your car)   Follow-up in 2 to 4 weeks to ensure symptom improvement  It is flu season:   >>>Remember to be washing your hands regularly, using hand sanitizer, be careful to use around herself with has contact with people who are sick will increase her chances of getting sick yourself. >>> Best ways to protect herself from the flu: Receive the yearly flu vaccine, practice good hand hygiene washing with soap and also using hand sanitizer when available, eat a nutritious meals, get adequate rest, hydrate appropriately   Please contact the office if your symptoms worsen or you have concerns that you are not improving.   Thank you for choosing [facility] for your healthcare, and for allowing [place] to partner with you on your healthcare journey. I am thankful to be able to provide care to you today.   Wyn Quaker FNP-C

## 2018-10-14 NOTE — Telephone Encounter (Signed)
Spoke with pt. States that she is not feeling well. Reports that she started feeling bad over the weekend and contacted her PCP. Pt is complaining of increased shortness of breath, wheezing and coughing. Cough is non productive at this time. Her PCP gave her Tesslon and a Zpack. Pt has one more day of the antibiotic left and she doesn't feel she is improving. She has been scheduled to see Aaron Edelman today at 3:30pm. Nothing further was needed.

## 2018-10-16 ENCOUNTER — Ambulatory Visit: Payer: Federal, State, Local not specified - PPO | Admitting: Nurse Practitioner

## 2018-10-27 ENCOUNTER — Telehealth: Payer: Self-pay | Admitting: Emergency Medicine

## 2018-10-27 NOTE — Telephone Encounter (Signed)
1 Vial Order Date: 2.10.2020 Shipping Date: 2.12.2020

## 2018-10-28 DIAGNOSIS — H401122 Primary open-angle glaucoma, left eye, moderate stage: Secondary | ICD-10-CM | POA: Diagnosis not present

## 2018-10-28 DIAGNOSIS — H401111 Primary open-angle glaucoma, right eye, mild stage: Secondary | ICD-10-CM | POA: Diagnosis not present

## 2018-10-30 NOTE — Telephone Encounter (Signed)
1 vial Arrival Date:09/29/2018 Lot #: [id number]

## 2018-11-03 NOTE — Progress Notes (Signed)
@Patient  ID: Ana Hughes, female    DOB: February 18, 1951, 68 y.o.   MRN: [id number]  Chief Complaint  Patient presents with  . Follow-up    follows for asthma, finished antibiotic and perles. uses Dulera daily. rarely has to use Albuteriol. symptoms doing better.     Referring provider: Buzzy Han*  HPI:  68 year old female never smoker followed in our office for asthma  PMH: Hypertension, glaucoma Smoker/ Smoking History: Never smoker Maintenance: Dulera 200, Nucala Pt of: Dr. Lamonte Sakai  11/04/2018  - Visit   68 year old female never smoker presenting to our office today for a follow-up visit after being treated with an asthma exacerbation 2 weeks ago.  Patient reports that symptoms have improved significantly.  Patient denies any acute symptoms today.  She is maintained well on Dulera 200 as well as Nucala injections which she will receive today.  Patient reports that she has not used her rescue inhaler in over a week.   Tests:   Outside records reviewed  > WBC 5.5, 4.2% eosinophils (05/12/2015)  Most recent spiro 03/26/18 >> FEV1 1.27 L (55% predicted), FVC 1.80 L (59% predicted), ratio 71%.  There is curve on her flow volume loop. Most recent TLC 3.39 (72% predicted) 02/12/2017 Most recent DLCO (uncorrected) 15.1 (67% predicted) 02/12/2017  FENO:  Lab Results  Component Value Date   NITRICOXIDE 18 10/14/2018    PFT: No flowsheet data found.  Imaging: No results found.    Specialty Problems      Pulmonary Problems   Asthma    Outside records reviewed  > WBC 5.5, 4.2% eosinophils (05/12/2015)  Most recent spiro 03/26/18 >> FEV1 1.27 L (55% predicted), FVC 1.80 L (59% predicted), ratio 71%.  There is curve on her flow volume loop. Most recent TLC 3.39 (72% predicted) 02/12/2017 Most recent DLCO (uncorrected) 15.1 (67% predicted) 02/12/2017  Maintained on nucala and dulera 200         Allergies  Allergen Reactions  . [place] [Brinzolamide-Brimonidine]      Immunization History  Administered Date(s) Administered  . Influenza, High Dose Seasonal PF 07/03/2018   We have attempted to contact primary care where patient reports she did receive the pneumonia vaccine  Past Medical History:  Diagnosis Date  . Asthma   . Glaucoma   . Hypertension     Tobacco History: Social History   Tobacco Use  Smoking Status Never Smoker  Smokeless Tobacco Never Used   Counseling given: Not Answered  Continue to not smoke  Outpatient Encounter Medications as of 11/04/2018  Medication Sig  . albuterol (PROVENTIL HFA;VENTOLIN HFA) 108 (90 Base) MCG/ACT inhaler Inhale 2 puffs into the lungs every 4 (four) hours as needed for wheezing or shortness of breath.  Marland Kitchen albuterol (PROVENTIL) (2.5 MG/3ML) 0.083% nebulizer solution Take 3 mLs (2.5 mg total) by nebulization every 6 (six) hours as needed for wheezing or shortness of breath.  . bimatoprost (LUMIGAN) 0.01 % SOLN Place 1 drop into both eyes at bedtime.  . dorzolamide (TRUSOPT) 2 % ophthalmic solution Place 1 drop into both eyes 3 (three) times daily.  . ergocalciferol (VITAMIN D2) 50000 units capsule Take 50,000 Units by mouth once a week.  Marland Kitchen FLUoxetine (PROZAC) 40 MG capsule Take 40 mg by mouth daily.  Marland Kitchen lisinopril (PRINIVIL,ZESTRIL) 2.5 MG tablet Take 2.5 mg by mouth daily.  . mometasone-formoterol (DULERA) 200-5 MCG/ACT AERO Inhale 2 puffs into the lungs 2 (two) times daily.  . [DISCONTINUED] azithromycin (ZITHROMAX) 250 MG tablet Take 250 mg  by mouth daily. Take 2 pills day 1 then 1 pill for 4 days  . [DISCONTINUED] benzonatate (TESSALON PERLES) 100 MG capsule Take by mouth 3 (three) times daily as needed for cough.   No facility-administered encounter medications on file as of 11/04/2018.      Review of Systems  Review of Systems  Constitutional: Negative for fatigue and fever.  HENT: Negative for congestion.   Respiratory: Negative for cough, shortness of breath and wheezing.     Cardiovascular: Negative for chest pain and palpitations.  Gastrointestinal: Negative for diarrhea, nausea and vomiting.     Physical Exam  BP 124/76 (BP Location: Left Arm, Cuff Size: Normal)   Pulse (!) 52   Ht 5' 2.5" (1.588 m)   Wt 203 lb 9.6 oz (92.4 kg)   SpO2 100%   BMI 36.65 kg/m   Wt Readings from Last 5 Encounters:  11/04/18 203 lb 9.6 oz (92.4 kg)  10/14/18 203 lb (92.1 kg)  10/07/18 202 lb (91.6 kg)  07/03/18 208 lb (94.3 kg)  05/26/18 214 lb (97.1 kg)     Physical Exam  Constitutional: She is oriented to person, place, and time and well-developed, well-nourished, and in no distress. No distress.  HENT:  Head: Normocephalic and atraumatic.  Right Ear: Hearing, tympanic membrane, external ear and ear canal normal.  Left Ear: Hearing, tympanic membrane, external ear and ear canal normal.  Nose: Nose normal. Right sinus exhibits no maxillary sinus tenderness and no frontal sinus tenderness. Left sinus exhibits no maxillary sinus tenderness and no frontal sinus tenderness.  Mouth/Throat: Uvula is midline and oropharynx is clear and moist. No oropharyngeal exudate.  Eyes: Pupils are equal, round, and reactive to light.  Neck: Normal range of motion. Neck supple.  Cardiovascular: Normal rate, regular rhythm and normal heart sounds.  Pulmonary/Chest: Effort normal and breath sounds normal. No accessory muscle usage. No respiratory distress. She has no decreased breath sounds. She has no wheezes. She has no rhonchi.  Musculoskeletal: Normal range of motion.        General: No edema.  Lymphadenopathy:    She has no cervical adenopathy.  Neurological: She is alert and oriented to person, place, and time. Gait normal.  Skin: Skin is warm and dry. She is not diaphoretic. No erythema.  Psychiatric: Mood, memory, affect and judgment normal.  Nursing note and vitals reviewed.     Lab Results:  CBC No results found for: WBC, RBC, HGB, HCT, PLT, MCV, MCH, MCHC, RDW,  LYMPHSABS, MONOABS, EOSABS, BASOSABS  BMET No results found for: NA, K, CL, CO2, GLUCOSE, BUN, CREATININE, CALCIUM, GFRNONAA, GFRAA  BNP No results found for: BNP  ProBNP No results found for: PROBNP    Assessment & Plan:    Asthma Assessment: Lungs clear to auscultation today BMI 37 Maintained on Dulera 200 twice daily and Nucala injections  Plan: Continue Dulera 200 twice daily Continue Nucala Continue rescue inhaler every 4-6 hours as needed for shortness of breath and wheezing Follow-up with our office in 3 to 6 months or sooner if symptoms worsen    Lauraine Rinne, NP 11/04/2018   This appointment was 15 min long with over 50% of the time in direct face-to-face patient care, assessment, plan of care, and follow-up.

## 2018-11-04 ENCOUNTER — Encounter: Payer: Self-pay | Admitting: Pulmonary Disease

## 2018-11-04 ENCOUNTER — Ambulatory Visit (INDEPENDENT_AMBULATORY_CARE_PROVIDER_SITE_OTHER): Payer: Medicare Other

## 2018-11-04 ENCOUNTER — Ambulatory Visit (INDEPENDENT_AMBULATORY_CARE_PROVIDER_SITE_OTHER): Payer: Medicare Other | Admitting: Pulmonary Disease

## 2018-11-04 DIAGNOSIS — J4520 Mild intermittent asthma, uncomplicated: Secondary | ICD-10-CM

## 2018-11-04 DIAGNOSIS — J4540 Moderate persistent asthma, uncomplicated: Secondary | ICD-10-CM | POA: Diagnosis not present

## 2018-11-04 MED ORDER — MOMETASONE FURO-FORMOTEROL FUM 200-5 MCG/ACT IN AERO: 2.0000 | INHALATION_SPRAY | Freq: Two times a day (BID) | RESPIRATORY_TRACT | 0 refills | Status: DC

## 2018-11-04 NOTE — Patient Instructions (Signed)
Continue Dulera 200 >>> 2 puffs in the morning right when you wake up, rinse out your mouth after use, 12 hours later 2 puffs, rinse after use >>> Take this daily, no matter what >>> This is not a rescue inhaler   Continue Nucala injections  Only use your albuterol as a rescue medication to be used if you can't catch your breath by resting or doing a relaxed purse lip breathing pattern.  - The less you use it, the better it will work when you need it. - Ok to use up to 2 puffs  every 4 hours if you must but call for immediate appointment if use goes up over your usual need - Don't leave home without it !!  (think of it like the spare tire for your car)   Follow-up with Dr. Lamonte Sakai in 3 to 6 months, or follow-up sooner if breathing worsens  It is flu season:   >>>Remember to be washing your hands regularly, using hand sanitizer, be careful to use around herself with has contact with people who are sick will increase her chances of getting sick yourself. >>> Best ways to protect herself from the flu: Receive the yearly flu vaccine, practice good hand hygiene washing with soap and also using hand sanitizer when available, eat a nutritious meals, get adequate rest, hydrate appropriately   Please contact the office if your symptoms worsen or you have concerns that you are not improving.   Thank you for choosing  [facility] for your healthcare, and for allowing [place] to partner with you on your healthcare journey. I am thankful to be able to provide care to you today.   Wyn Quaker FNP-C

## 2018-11-04 NOTE — Assessment & Plan Note (Signed)
Assessment: Lungs clear to auscultation today BMI 37 Maintained on Dulera 200 twice daily and Nucala injections  Plan: Continue Dulera 200 twice daily Continue Nucala Continue rescue inhaler every 4-6 hours as needed for shortness of breath and wheezing Follow-up with our office in 3 to 6 months or sooner if symptoms worsen

## 2018-11-05 MED ORDER — MEPOLIZUMAB 100 MG ~~LOC~~ SOLR
100.0000 mg | Freq: Once | SUBCUTANEOUS | Status: AC
  Administered 2018-11-04: 100 mg via SUBCUTANEOUS

## 2018-11-05 NOTE — Progress Notes (Signed)
Pt injections was given on 11/04/2018.

## 2018-11-06 ENCOUNTER — Telehealth: Payer: Self-pay | Admitting: Emergency Medicine

## 2018-11-06 NOTE — Telephone Encounter (Signed)
1 vial Arrival Date:11/04/2018 Lot #: [id number] Date:02/2022 Didn't get a chance to put order or del. In.

## 2018-11-07 ENCOUNTER — Ambulatory Visit: Payer: Federal, State, Local not specified - PPO

## 2018-11-10 ENCOUNTER — Ambulatory Visit (INDEPENDENT_AMBULATORY_CARE_PROVIDER_SITE_OTHER): Payer: Medicare Other | Admitting: Adult Health

## 2018-11-10 ENCOUNTER — Encounter: Payer: Self-pay | Admitting: Adult Health

## 2018-11-10 VITALS — BP 118/70 | HR 59 | Temp 98.7°F | Ht 62.5 in | Wt 203.4 lb

## 2018-11-10 DIAGNOSIS — M5136 Other intervertebral disc degeneration, lumbar region: Secondary | ICD-10-CM | POA: Diagnosis not present

## 2018-11-10 DIAGNOSIS — M5032 Other cervical disc degeneration, mid-cervical region, unspecified level: Secondary | ICD-10-CM | POA: Diagnosis not present

## 2018-11-10 DIAGNOSIS — M9905 Segmental and somatic dysfunction of pelvic region: Secondary | ICD-10-CM | POA: Diagnosis not present

## 2018-11-10 DIAGNOSIS — M5137 Other intervertebral disc degeneration, lumbosacral region: Secondary | ICD-10-CM | POA: Diagnosis not present

## 2018-11-10 DIAGNOSIS — B370 Candidal stomatitis: Secondary | ICD-10-CM | POA: Diagnosis not present

## 2018-11-10 DIAGNOSIS — M9902 Segmental and somatic dysfunction of thoracic region: Secondary | ICD-10-CM | POA: Diagnosis not present

## 2018-11-10 DIAGNOSIS — J4540 Moderate persistent asthma, uncomplicated: Secondary | ICD-10-CM

## 2018-11-10 DIAGNOSIS — M9901 Segmental and somatic dysfunction of cervical region: Secondary | ICD-10-CM | POA: Diagnosis not present

## 2018-11-10 DIAGNOSIS — M9903 Segmental and somatic dysfunction of lumbar region: Secondary | ICD-10-CM | POA: Diagnosis not present

## 2018-11-10 DIAGNOSIS — S29012A Strain of muscle and tendon of back wall of thorax, initial encounter: Secondary | ICD-10-CM | POA: Diagnosis not present

## 2018-11-10 LAB — STREP COMPLETE PANEL
Strep Pyogenes: NEGATIVE
Strep dysgalactiae: NEGATIVE

## 2018-11-10 MED ORDER — CLOTRIMAZOLE 10 MG MT TROC: 10.0000 mg | Freq: Every day | OROMUCOSAL | 0 refills | Status: DC

## 2018-11-10 MED ORDER — AZITHROMYCIN 250 MG PO TABS: ORAL_TABLET | ORAL | 0 refills | Status: AC

## 2018-11-10 NOTE — Progress Notes (Signed)
@Patient  ID: Ana Hughes, female    DOB: 1951-02-10, 68 y.o.   MRN: [id number]  Chief Complaint  Patient presents with  . Acute Visit    Asthma     Referring provider: Buzzy Han*  HPI: 68 year old female never smoker followed for severe persistent asthma (on Nucala )   TEST/EVENTS :  WBC 5.5, 4.2% eosinophils (05/12/2015)  Most recent spiro 03/26/18 >> FEV1 1.27 L (55% predicted), FVC 1.80 L (59% predicted), ratio 71%.  There is curve on her flow volume loop. Most recent TLC 3.39 (72% predicted) 02/12/2017 Most recent DLCO (uncorrected) 15.1 (67% predicted) 02/12/2017  11/10/2018 Acute OV : Asthma  Patient presents for an acute office visit.  She complains of  5 days of cough, congestion , sinus congestion , ear fullness, green nasal discharge . Taking albuterol nebs and tessalon with some help .  Feels she has a sinus /chest infection . Trying to get ahead of it so her asthma does not get out of control .  No recent travel or antibiotic use. No sick contacts.  She has severe persistent asthma on Dulera and Nucala .  Last flare 09/2018 , required prednisone taper. Says got totally better until 5 days ago.      Allergies  Allergen Reactions  . [place] [Brinzolamide-Brimonidine]     Immunization History  Administered Date(s) Administered  . Influenza, High Dose Seasonal PF 07/03/2018    Past Medical History:  Diagnosis Date  . Asthma   . Glaucoma   . Hypertension     Tobacco History: Social History   Tobacco Use  Smoking Status Never Smoker  Smokeless Tobacco Never Used   Counseling given: Not Answered   Outpatient Medications Prior to Visit  Medication Sig Dispense Refill  . albuterol (PROVENTIL HFA;VENTOLIN HFA) 108 (90 Base) MCG/ACT inhaler Inhale 2 puffs into the lungs every 4 (four) hours as needed for wheezing or shortness of breath. 1 Inhaler 5  . albuterol (PROVENTIL) (2.5 MG/3ML) 0.083% nebulizer solution Take 3 mLs (2.5 mg total) by  nebulization every 6 (six) hours as needed for wheezing or shortness of breath. 360 mL 3  . bimatoprost (LUMIGAN) 0.01 % SOLN Place 1 drop into both eyes at bedtime.    . dorzolamide (TRUSOPT) 2 % ophthalmic solution Place 1 drop into both eyes 3 (three) times daily.    . ergocalciferol (VITAMIN D2) 50000 units capsule Take 50,000 Units by mouth once a week.    Marland Kitchen FLUoxetine (PROZAC) 40 MG capsule Take 40 mg by mouth daily.    Marland Kitchen lisinopril (PRINIVIL,ZESTRIL) 2.5 MG tablet Take 2.5 mg by mouth daily.    . mometasone-formoterol (DULERA) 200-5 MCG/ACT AERO Inhale 2 puffs into the lungs 2 (two) times daily. 3 Inhaler 3  . mometasone-formoterol (DULERA) 200-5 MCG/ACT AERO Inhale 2 puffs into the lungs 2 (two) times daily. 1 Inhaler 0   No facility-administered medications prior to visit.      Review of Systems:   Constitutional:   No  weight loss, night sweats,  Fevers, chills, fatigue, or  lassitude.  HEENT:   No headaches,  Difficulty swallowing,  Tooth/dental problems, or  Sore throat,                No sneezing, itching, ear ache,  +nasal congestion, post nasal drip,   CV:  No chest pain,  Orthopnea, PND, swelling in lower extremities, anasarca, dizziness, palpitations, syncope.   GI  No heartburn, indigestion, abdominal pain, nausea, vomiting, diarrhea, change in  bowel habits, loss of appetite, bloody stools.   Resp:   No chest wall deformity  Skin: no rash or lesions.  GU: no dysuria, change in color of urine, no urgency or frequency.  No flank pain, no hematuria   MS:  No joint pain or swelling.  No decreased range of motion.  No back pain.    Physical Exam  BP 118/70 (BP Location: Left Arm, Cuff Size: Normal)   Pulse (!) 59   Temp 98.7 F (37.1 C) (Oral)   Ht 5' 2.5" (1.588 m)   Wt 203 lb 6.4 oz (92.3 kg)   SpO2 98%   BMI 36.61 kg/m   GEN: A/Ox3; pleasant , NAD, elderly , obese    HEENT:  [place]/AT,  EACs-clear, TMs-wnl, NOSE-clear drainage  THROAT- white patches along  posterior pharynx -   NECK:  Supple w/ fair ROM; no JVD; normal carotid impulses w/o bruits; no thyromegaly or nodules palpated; no lymphadenopathy.    RESP  Clear  P & A; w/o, wheezes/ rales/ or rhonchi. no accessory muscle use, no dullness to percussion  CARD:  RRR, no m/r/g, no peripheral edema, pulses intact, no cyanosis or clubbing.  GI:   Soft & nt; nml bowel sounds; no organomegaly or masses detected.   Musco: Warm bil, no deformities or joint swelling noted.   Neuro: alert, no focal deficits noted.    Skin: Warm, no lesions or rashes    Lab Results:  CBC No results found for: WBC, RBC, HGB, HCT, PLT, MCV, MCH, MCHC, RDW, LYMPHSABS, MONOABS, EOSABS, BASOSABS  BMET No results found for: NA, K, CL, CO2, GLUCOSE, BUN, CREATININE, CALCIUM, GFRNONAA, GFRAA  BNP No results found for: BNP  ProBNP No results found for: PROBNP  Imaging: No results found.  levalbuterol (XOPENEX) nebulizer solution 0.63 mg    Date Action Dose Route User   10/14/2018 1640 Given 0.63 mg Nebulization Madolyn Frieze, LPN    Mepolizumab SOLR 100 mg    Date Action Dose Route User   10/08/2018 1459 Given 100 mg Subcutaneous (Right Arm) Scott, Bowie Doiron B    Mepolizumab SOLR 100 mg    Date Action Dose Route User   11/04/2018 1203 Given 100 mg Subcutaneous (Left Arm) Scott, Kevin Mario B      No flowsheet data found.  Lab Results  Component Value Date   NITRICOXIDE 18 10/14/2018        Assessment & Plan:   Asthma Acute Asthmatic bronchitic exacerbation  She is prone to frequent exacerbations - on maximum medical regimen  Would avoid ACE inhibitors going forward with this patient as tendency for Cough side effect  Check strep test    Plan  Patient Instructions  Begin Zpack take as directed, take with food.  Mucinex DM Twice daily  As needed  Cough/congestion  Saline nasal rinses As needed   Mycelex troche five times daily for 1 week .   Continue on Dulera 2 puffs twice daily- rinse  well after use.   Please discuss with your primary care provider that lisinopril may be aggravating your cough  Follow up with .Dr. Lamonte Sakai  As planned in May and As needed    Please contact office for sooner follow up if symptoms do not improve or worsen or seek emergency care        Oral candidiasis Oral candidiasis  Check strep test   Plan  mycelex x 1 week  Inhaler oral care teaching .  Rexene Edison, NP 11/10/2018

## 2018-11-10 NOTE — Assessment & Plan Note (Signed)
Acute Asthmatic bronchitic exacerbation  She is prone to frequent exacerbations - on maximum medical regimen  Would avoid ACE inhibitors going forward with this patient as tendency for Cough side effect  Check strep test    Plan  Patient Instructions  Begin Zpack take as directed, take with food.  Mucinex DM Twice daily  As needed  Cough/congestion  Saline nasal rinses As needed   Mycelex troche five times daily for 1 week .   Continue on Dulera 2 puffs twice daily- rinse well after use.   Please discuss with your primary care provider that lisinopril may be aggravating your cough  Follow up with .Dr. Lamonte Sakai  As planned in May and As needed    Please contact office for sooner follow up if symptoms do not improve or worsen or seek emergency care

## 2018-11-10 NOTE — Patient Instructions (Addendum)
Begin Zpack take as directed, take with food.  Mucinex DM Twice daily  As needed  Cough/congestion  Saline nasal rinses As needed   Mycelex troche five times daily for 1 week .   Continue on Dulera 2 puffs twice daily- rinse well after use.   Please discuss with your primary care provider that lisinopril may be aggravating your cough  Follow up with .Dr. Lamonte Sakai  As planned in May and As needed    Please contact office for sooner follow up if symptoms do not improve or worsen or seek emergency care

## 2018-11-10 NOTE — Assessment & Plan Note (Signed)
Oral candidiasis  Check strep test   Plan  mycelex x 1 week  Inhaler oral care teaching .

## 2018-11-11 NOTE — Progress Notes (Signed)
Called spoke with patient, advised of lab results / recs as stated by TP.  Pt verbalized understanding and denied any questions. 

## 2018-11-12 DIAGNOSIS — S29012A Strain of muscle and tendon of back wall of thorax, initial encounter: Secondary | ICD-10-CM | POA: Diagnosis not present

## 2018-11-12 DIAGNOSIS — M5136 Other intervertebral disc degeneration, lumbar region: Secondary | ICD-10-CM | POA: Diagnosis not present

## 2018-11-12 DIAGNOSIS — M5137 Other intervertebral disc degeneration, lumbosacral region: Secondary | ICD-10-CM | POA: Diagnosis not present

## 2018-11-12 DIAGNOSIS — M9901 Segmental and somatic dysfunction of cervical region: Secondary | ICD-10-CM | POA: Diagnosis not present

## 2018-11-12 DIAGNOSIS — M9905 Segmental and somatic dysfunction of pelvic region: Secondary | ICD-10-CM | POA: Diagnosis not present

## 2018-11-12 DIAGNOSIS — M9903 Segmental and somatic dysfunction of lumbar region: Secondary | ICD-10-CM | POA: Diagnosis not present

## 2018-11-12 DIAGNOSIS — M5032 Other cervical disc degeneration, mid-cervical region, unspecified level: Secondary | ICD-10-CM | POA: Diagnosis not present

## 2018-11-12 DIAGNOSIS — M9902 Segmental and somatic dysfunction of thoracic region: Secondary | ICD-10-CM | POA: Diagnosis not present

## 2018-11-13 ENCOUNTER — Telehealth: Payer: Self-pay | Admitting: Emergency Medicine

## 2018-11-13 MED ORDER — ERGOCALCIFEROL 1.25 MG (50000 UT) PO CAPS: 50000.0000 [IU] | ORAL_CAPSULE | ORAL | 5 refills | Status: DC

## 2018-11-13 NOTE — Telephone Encounter (Signed)
Returned call to patient, requesting refill of Vit D2. Confirmed pharmacy, refill sent. Voiced understanding. Nothing further needed at this time.

## 2018-11-18 DIAGNOSIS — S29012A Strain of muscle and tendon of back wall of thorax, initial encounter: Secondary | ICD-10-CM | POA: Diagnosis not present

## 2018-11-18 DIAGNOSIS — M5032 Other cervical disc degeneration, mid-cervical region, unspecified level: Secondary | ICD-10-CM | POA: Diagnosis not present

## 2018-11-18 DIAGNOSIS — M9902 Segmental and somatic dysfunction of thoracic region: Secondary | ICD-10-CM | POA: Diagnosis not present

## 2018-11-18 DIAGNOSIS — M5137 Other intervertebral disc degeneration, lumbosacral region: Secondary | ICD-10-CM | POA: Diagnosis not present

## 2018-11-18 DIAGNOSIS — M9901 Segmental and somatic dysfunction of cervical region: Secondary | ICD-10-CM | POA: Diagnosis not present

## 2018-11-18 DIAGNOSIS — M5136 Other intervertebral disc degeneration, lumbar region: Secondary | ICD-10-CM | POA: Diagnosis not present

## 2018-11-18 DIAGNOSIS — M9903 Segmental and somatic dysfunction of lumbar region: Secondary | ICD-10-CM | POA: Diagnosis not present

## 2018-11-18 DIAGNOSIS — M9905 Segmental and somatic dysfunction of pelvic region: Secondary | ICD-10-CM | POA: Diagnosis not present

## 2018-11-20 DIAGNOSIS — M5137 Other intervertebral disc degeneration, lumbosacral region: Secondary | ICD-10-CM | POA: Diagnosis not present

## 2018-11-20 DIAGNOSIS — M9901 Segmental and somatic dysfunction of cervical region: Secondary | ICD-10-CM | POA: Diagnosis not present

## 2018-11-20 DIAGNOSIS — M9903 Segmental and somatic dysfunction of lumbar region: Secondary | ICD-10-CM | POA: Diagnosis not present

## 2018-11-20 DIAGNOSIS — S29012A Strain of muscle and tendon of back wall of thorax, initial encounter: Secondary | ICD-10-CM | POA: Diagnosis not present

## 2018-11-20 DIAGNOSIS — M9902 Segmental and somatic dysfunction of thoracic region: Secondary | ICD-10-CM | POA: Diagnosis not present

## 2018-11-20 DIAGNOSIS — M5032 Other cervical disc degeneration, mid-cervical region, unspecified level: Secondary | ICD-10-CM | POA: Diagnosis not present

## 2018-11-20 DIAGNOSIS — M9905 Segmental and somatic dysfunction of pelvic region: Secondary | ICD-10-CM | POA: Diagnosis not present

## 2018-11-20 DIAGNOSIS — M5136 Other intervertebral disc degeneration, lumbar region: Secondary | ICD-10-CM | POA: Diagnosis not present

## 2018-11-26 DIAGNOSIS — S29012A Strain of muscle and tendon of back wall of thorax, initial encounter: Secondary | ICD-10-CM | POA: Diagnosis not present

## 2018-11-26 DIAGNOSIS — M9903 Segmental and somatic dysfunction of lumbar region: Secondary | ICD-10-CM | POA: Diagnosis not present

## 2018-11-26 DIAGNOSIS — M9905 Segmental and somatic dysfunction of pelvic region: Secondary | ICD-10-CM | POA: Diagnosis not present

## 2018-11-26 DIAGNOSIS — M9901 Segmental and somatic dysfunction of cervical region: Secondary | ICD-10-CM | POA: Diagnosis not present

## 2018-11-26 DIAGNOSIS — M9902 Segmental and somatic dysfunction of thoracic region: Secondary | ICD-10-CM | POA: Diagnosis not present

## 2018-11-26 DIAGNOSIS — M5137 Other intervertebral disc degeneration, lumbosacral region: Secondary | ICD-10-CM | POA: Diagnosis not present

## 2018-11-26 DIAGNOSIS — M5032 Other cervical disc degeneration, mid-cervical region, unspecified level: Secondary | ICD-10-CM | POA: Diagnosis not present

## 2018-11-26 DIAGNOSIS — M5136 Other intervertebral disc degeneration, lumbar region: Secondary | ICD-10-CM | POA: Diagnosis not present

## 2018-11-28 ENCOUNTER — Ambulatory Visit: Payer: Medicare Other

## 2018-11-28 ENCOUNTER — Ambulatory Visit
Admission: RE | Admit: 2018-11-28 | Discharge: 2018-11-28 | Disposition: A | Discharge status: Home / Self Care | Payer: Medicare Other | Source: Ambulatory Visit | Attending: Family Medicine | Admitting: Family Medicine

## 2018-11-28 ENCOUNTER — Other Ambulatory Visit: Payer: Self-pay

## 2018-11-28 DIAGNOSIS — Z1231 Encounter for screening mammogram for malignant neoplasm of breast: Secondary | ICD-10-CM | POA: Diagnosis not present

## 2018-12-01 ENCOUNTER — Telehealth: Payer: Self-pay | Admitting: Emergency Medicine

## 2018-12-02 NOTE — Telephone Encounter (Signed)
1 Vial Order Date: 12/01/2018 Shipping Date: 12/01/2018  1 vial Arrival Date:12/02/2018 Lot #: [id number] Exp Date:05/2022

## 2018-12-03 ENCOUNTER — Other Ambulatory Visit: Payer: Self-pay

## 2018-12-03 ENCOUNTER — Ambulatory Visit (INDEPENDENT_AMBULATORY_CARE_PROVIDER_SITE_OTHER): Payer: Medicare Other

## 2018-12-03 DIAGNOSIS — M9901 Segmental and somatic dysfunction of cervical region: Secondary | ICD-10-CM | POA: Diagnosis not present

## 2018-12-03 DIAGNOSIS — M9905 Segmental and somatic dysfunction of pelvic region: Secondary | ICD-10-CM | POA: Diagnosis not present

## 2018-12-03 DIAGNOSIS — M9903 Segmental and somatic dysfunction of lumbar region: Secondary | ICD-10-CM | POA: Diagnosis not present

## 2018-12-03 DIAGNOSIS — M5032 Other cervical disc degeneration, mid-cervical region, unspecified level: Secondary | ICD-10-CM | POA: Diagnosis not present

## 2018-12-03 DIAGNOSIS — S29012A Strain of muscle and tendon of back wall of thorax, initial encounter: Secondary | ICD-10-CM | POA: Diagnosis not present

## 2018-12-03 DIAGNOSIS — M5137 Other intervertebral disc degeneration, lumbosacral region: Secondary | ICD-10-CM | POA: Diagnosis not present

## 2018-12-03 DIAGNOSIS — M5136 Other intervertebral disc degeneration, lumbar region: Secondary | ICD-10-CM | POA: Diagnosis not present

## 2018-12-03 DIAGNOSIS — M9902 Segmental and somatic dysfunction of thoracic region: Secondary | ICD-10-CM | POA: Diagnosis not present

## 2018-12-03 DIAGNOSIS — J4540 Moderate persistent asthma, uncomplicated: Secondary | ICD-10-CM

## 2018-12-03 MED ORDER — MEPOLIZUMAB 100 MG ~~LOC~~ SOLR
100.0000 mg | Freq: Once | SUBCUTANEOUS | Status: AC
  Administered 2018-12-03: 100 mg via SUBCUTANEOUS

## 2018-12-10 DIAGNOSIS — M5032 Other cervical disc degeneration, mid-cervical region, unspecified level: Secondary | ICD-10-CM | POA: Diagnosis not present

## 2018-12-10 DIAGNOSIS — M5136 Other intervertebral disc degeneration, lumbar region: Secondary | ICD-10-CM | POA: Diagnosis not present

## 2018-12-10 DIAGNOSIS — M9905 Segmental and somatic dysfunction of pelvic region: Secondary | ICD-10-CM | POA: Diagnosis not present

## 2018-12-10 DIAGNOSIS — M5137 Other intervertebral disc degeneration, lumbosacral region: Secondary | ICD-10-CM | POA: Diagnosis not present

## 2018-12-10 DIAGNOSIS — M9902 Segmental and somatic dysfunction of thoracic region: Secondary | ICD-10-CM | POA: Diagnosis not present

## 2018-12-10 DIAGNOSIS — M9901 Segmental and somatic dysfunction of cervical region: Secondary | ICD-10-CM | POA: Diagnosis not present

## 2018-12-10 DIAGNOSIS — S29012A Strain of muscle and tendon of back wall of thorax, initial encounter: Secondary | ICD-10-CM | POA: Diagnosis not present

## 2018-12-10 DIAGNOSIS — M9903 Segmental and somatic dysfunction of lumbar region: Secondary | ICD-10-CM | POA: Diagnosis not present

## 2018-12-22 ENCOUNTER — Telehealth: Payer: Self-pay | Admitting: Emergency Medicine

## 2018-12-22 NOTE — Telephone Encounter (Signed)
1 Vial Order Date: 12/22/2018 Shipping Date: 12/22/2018

## 2018-12-23 DIAGNOSIS — M9903 Segmental and somatic dysfunction of lumbar region: Secondary | ICD-10-CM | POA: Diagnosis not present

## 2018-12-23 DIAGNOSIS — M5136 Other intervertebral disc degeneration, lumbar region: Secondary | ICD-10-CM | POA: Diagnosis not present

## 2018-12-23 DIAGNOSIS — S29012A Strain of muscle and tendon of back wall of thorax, initial encounter: Secondary | ICD-10-CM | POA: Diagnosis not present

## 2018-12-23 DIAGNOSIS — M5032 Other cervical disc degeneration, mid-cervical region, unspecified level: Secondary | ICD-10-CM | POA: Diagnosis not present

## 2018-12-23 DIAGNOSIS — M5137 Other intervertebral disc degeneration, lumbosacral region: Secondary | ICD-10-CM | POA: Diagnosis not present

## 2018-12-23 DIAGNOSIS — M9901 Segmental and somatic dysfunction of cervical region: Secondary | ICD-10-CM | POA: Diagnosis not present

## 2018-12-23 DIAGNOSIS — M9905 Segmental and somatic dysfunction of pelvic region: Secondary | ICD-10-CM | POA: Diagnosis not present

## 2018-12-23 DIAGNOSIS — M9902 Segmental and somatic dysfunction of thoracic region: Secondary | ICD-10-CM | POA: Diagnosis not present

## 2018-12-23 NOTE — Telephone Encounter (Signed)
1 vial Arrival Date:12/23/2018 Lot #: [id number] Exp Date:05/2022  Nucala rc'd 12/23/2018

## 2019-01-01 ENCOUNTER — Encounter: Payer: Self-pay | Admitting: Emergency Medicine

## 2019-01-01 ENCOUNTER — Ambulatory Visit (INDEPENDENT_AMBULATORY_CARE_PROVIDER_SITE_OTHER): Payer: Medicare Other

## 2019-01-01 ENCOUNTER — Other Ambulatory Visit: Payer: Self-pay

## 2019-01-01 DIAGNOSIS — J4540 Moderate persistent asthma, uncomplicated: Secondary | ICD-10-CM

## 2019-01-01 MED ORDER — MEPOLIZUMAB 100 MG ~~LOC~~ SOLR
100.0000 mg | Freq: Once | SUBCUTANEOUS | Status: AC
  Administered 2019-01-01: 100 mg via SUBCUTANEOUS

## 2019-01-01 NOTE — Progress Notes (Signed)
Have you been hospitalized within the last 10 days?  No Do you have a fever?  No Do you have a cough?  No Do you have a headache or sore throat? No  

## 2019-01-06 DIAGNOSIS — S29012A Strain of muscle and tendon of back wall of thorax, initial encounter: Secondary | ICD-10-CM | POA: Diagnosis not present

## 2019-01-06 DIAGNOSIS — M9902 Segmental and somatic dysfunction of thoracic region: Secondary | ICD-10-CM | POA: Diagnosis not present

## 2019-01-06 DIAGNOSIS — M9901 Segmental and somatic dysfunction of cervical region: Secondary | ICD-10-CM | POA: Diagnosis not present

## 2019-01-06 DIAGNOSIS — M5136 Other intervertebral disc degeneration, lumbar region: Secondary | ICD-10-CM | POA: Diagnosis not present

## 2019-01-06 DIAGNOSIS — M5032 Other cervical disc degeneration, mid-cervical region, unspecified level: Secondary | ICD-10-CM | POA: Diagnosis not present

## 2019-01-06 DIAGNOSIS — M5137 Other intervertebral disc degeneration, lumbosacral region: Secondary | ICD-10-CM | POA: Diagnosis not present

## 2019-01-06 DIAGNOSIS — M9903 Segmental and somatic dysfunction of lumbar region: Secondary | ICD-10-CM | POA: Diagnosis not present

## 2019-01-06 DIAGNOSIS — M9905 Segmental and somatic dysfunction of pelvic region: Secondary | ICD-10-CM | POA: Diagnosis not present

## 2019-01-19 ENCOUNTER — Telehealth: Payer: Self-pay | Admitting: Emergency Medicine

## 2019-01-19 NOTE — Telephone Encounter (Signed)
Nucala Order: 100mg  #1 Vial Order Date: 01/19/2019 Expected date of arrival: 01/20/2019 Ordered by: Desmond Dike, [place]: Nigel Mormon

## 2019-01-20 NOTE — Telephone Encounter (Signed)
Nucala Shipment Received: 100mg  #1 vial Medication arrival date: 01/20/2019 Lot #: [id number] Medication exp date: 06/2022 Received by: Desmond Dike, CMA

## 2019-01-27 DIAGNOSIS — S29012A Strain of muscle and tendon of back wall of thorax, initial encounter: Secondary | ICD-10-CM | POA: Diagnosis not present

## 2019-01-27 DIAGNOSIS — M9903 Segmental and somatic dysfunction of lumbar region: Secondary | ICD-10-CM | POA: Diagnosis not present

## 2019-01-27 DIAGNOSIS — M5137 Other intervertebral disc degeneration, lumbosacral region: Secondary | ICD-10-CM | POA: Diagnosis not present

## 2019-01-27 DIAGNOSIS — M9901 Segmental and somatic dysfunction of cervical region: Secondary | ICD-10-CM | POA: Diagnosis not present

## 2019-01-27 DIAGNOSIS — M9905 Segmental and somatic dysfunction of pelvic region: Secondary | ICD-10-CM | POA: Diagnosis not present

## 2019-01-27 DIAGNOSIS — M5136 Other intervertebral disc degeneration, lumbar region: Secondary | ICD-10-CM | POA: Diagnosis not present

## 2019-01-27 DIAGNOSIS — M5032 Other cervical disc degeneration, mid-cervical region, unspecified level: Secondary | ICD-10-CM | POA: Diagnosis not present

## 2019-01-27 DIAGNOSIS — M9902 Segmental and somatic dysfunction of thoracic region: Secondary | ICD-10-CM | POA: Diagnosis not present

## 2019-01-29 ENCOUNTER — Ambulatory Visit (INDEPENDENT_AMBULATORY_CARE_PROVIDER_SITE_OTHER): Payer: Medicare Other

## 2019-01-29 ENCOUNTER — Telehealth: Payer: Self-pay | Admitting: *Deleted

## 2019-01-29 ENCOUNTER — Other Ambulatory Visit: Payer: Self-pay

## 2019-01-29 DIAGNOSIS — J4540 Moderate persistent asthma, uncomplicated: Secondary | ICD-10-CM | POA: Diagnosis not present

## 2019-01-29 MED ORDER — MEPOLIZUMAB 100 MG ~~LOC~~ SOLR
100.0000 mg | Freq: Once | SUBCUTANEOUS | Status: AC
  Administered 2019-01-29: 15:00:00 100 mg via SUBCUTANEOUS

## 2019-01-29 NOTE — Telephone Encounter (Signed)
If patient is asymptomatic then sure.  Patient needs to not be having chest pain, blurred vision, headaches, heart palpitations etc.  Patient does need to follow-up with primary care regarding her blood pressure results.  As discussed with Parke Poisson, CMA patient was offered an office visit today and patient declined that.  She plans on following up with PCP  Okay to give Nucala injection today.  Remind patient that if she starts having chest pain, blurred vision, headaches, heart palpitations or worsening symptoms she needs to present to an emergency room for further evaluation.  Wyn Quaker, FNP

## 2019-01-29 NOTE — Progress Notes (Signed)
Have you been hospitalized within the last 10 days?  No Do you have a fever?  No Do you have a cough?  No Do you have a headache or sore throat? No  

## 2019-01-29 NOTE — Telephone Encounter (Signed)
Pt's BP was 171/92 at 11:30 this morning. She took it again 10 mins later it was 142/79. She's going to discuss this with her PCP. Can she get her Nucala inj.?

## 2019-02-04 NOTE — Telephone Encounter (Signed)
Gave pt Nucala injection per Wyn Quaker. Pt BP was high this morning but when pt took BP again 10 mins. Later it was coming down. (I didn't get a chance to put this in Thurs.Marland Kitchen) Pt was not experiencing any of the sx BM mentioned at the time of her injection. But I did tell her if she did have any of those sx to go to the ED. Pt understood. Pt is going to follow up with PCP. Nothing further needed.

## 2019-02-12 NOTE — Progress Notes (Signed)
@Patient  ID: Ana Hughes, female    DOB: 05-Sep-1951, 68 y.o.   MRN: [id number]  Chief Complaint  Patient presents with  . Follow-up    Asthma follow-up, 32-month follow-up    Referring provider: Buzzy Han*  HPI:  68 year old female never smoker followed in our office for asthma  PMH: Hypertension, glaucoma Smoker/ Smoking History: Never smoker Maintenance: Dulera 200, Nucala Pt of: Dr. Lamonte Sakai  02/13/2019  - Visit   68 year old female never smoker followed in our office for persistent eosinophilic asthma.  Patient is managed on Dulera 200 as well as Nucala injections.  Patient reports that since last office visit (February/2020) patient has not had to use her rescue inhaler or albuterol nebulizer.  Patient reports is the best her breathing has been in months.  She is very happy with her medication regimen at this time.  She continues to be compliant with Nucala injections at our office.  Patient has no active concerns or complaints.     Tests:   Outside records reviewed  > WBC 5.5, 4.2% eosinophils (05/12/2015)  Most recent spiro 03/26/18 >> FEV1 1.27 L (55% predicted), FVC 1.80 L (59% predicted), ratio 71%.  There is curve on her flow volume loop. Most recent TLC 3.39 (72% predicted) 02/12/2017 Most recent DLCO (uncorrected) 15.1 (67% predicted) 02/12/2017  FENO:  Lab Results  Component Value Date   NITRICOXIDE 18 10/14/2018    PFT: No flowsheet data found.  Imaging: No results found.    Specialty Problems      Pulmonary Problems   Asthma    Outside records reviewed  > WBC 5.5, 4.2% eosinophils (05/12/2015)  Most recent spiro 03/26/18 >> FEV1 1.27 L (55% predicted), FVC 1.80 L (59% predicted), ratio 71%.  There is curve on her flow volume loop. Most recent TLC 3.39 (72% predicted) 02/12/2017 Most recent DLCO (uncorrected) 15.1 (67% predicted) 02/12/2017  Maintained on nucala and dulera 200         Allergies  Allergen Reactions  . [place]  [Brinzolamide-Brimonidine]     Immunization History  Administered Date(s) Administered  . Influenza, High Dose Seasonal PF 07/03/2018    Past Medical History:  Diagnosis Date  . Asthma   . Glaucoma   . Hypertension     Tobacco History: Social History   Tobacco Use  Smoking Status Never Smoker  Smokeless Tobacco Never Used   Counseling given: Yes   Continue to not smoke  Outpatient Encounter Medications as of 02/13/2019  Medication Sig  . albuterol (PROVENTIL HFA;VENTOLIN HFA) 108 (90 Base) MCG/ACT inhaler Inhale 2 puffs into the lungs every 4 (four) hours as needed for wheezing or shortness of breath.  Marland Kitchen albuterol (PROVENTIL) (2.5 MG/3ML) 0.083% nebulizer solution Take 3 mLs (2.5 mg total) by nebulization every 6 (six) hours as needed for wheezing or shortness of breath.  . bimatoprost (LUMIGAN) 0.01 % SOLN Place 1 drop into both eyes at bedtime.  . clotrimazole (MYCELEX) 10 MG troche Take 1 tablet (10 mg total) by mouth 5 (five) times daily.  . dorzolamide (TRUSOPT) 2 % ophthalmic solution Place 1 drop into both eyes 3 (three) times daily.  . ergocalciferol (VITAMIN D2) 1.25 MG (50000 UT) capsule Take 1 capsule (50,000 Units total) by mouth once a week.  Marland Kitchen FLUoxetine (PROZAC) 40 MG capsule Take 40 mg by mouth daily.  Marland Kitchen lisinopril (PRINIVIL,ZESTRIL) 2.5 MG tablet Take 2.5 mg by mouth daily.  . mometasone-formoterol (DULERA) 200-5 MCG/ACT AERO Inhale 2 puffs into the lungs 2 (two)  times daily.  . mometasone-formoterol (DULERA) 200-5 MCG/ACT AERO Inhale 2 puffs into the lungs 2 (two) times a day.   No facility-administered encounter medications on file as of 02/13/2019.      Review of Systems  Review of Systems  Constitutional: Negative for chills, fatigue, fever and unexpected weight change.  HENT: Negative for congestion, postnasal drip, sinus pressure and sinus pain.   Respiratory: Negative for cough, chest tightness, shortness of breath and wheezing.    Cardiovascular: Negative for chest pain and palpitations.  Gastrointestinal: Negative for diarrhea, nausea and vomiting.  Musculoskeletal: Negative for arthralgias.  Skin: Negative for color change.  Allergic/Immunologic: Negative for environmental allergies and food allergies.  Neurological: Negative for dizziness, light-headedness and headaches.  Psychiatric/Behavioral: Negative for dysphoric mood. The patient is not nervous/anxious.   All other systems reviewed and are negative.    Physical Exam  BP 122/72 (BP Location: Left Arm, Cuff Size: Normal)   Pulse 64   Temp 97.9 F (36.6 C) (Oral)   Ht 5\' 1"  (1.549 m)   Wt 206 lb 12.8 oz (93.8 kg)   SpO2 95%   BMI 39.07 kg/m   Wt Readings from Last 5 Encounters:  02/13/19 206 lb 12.8 oz (93.8 kg)  11/10/18 203 lb 6.4 oz (92.3 kg)  11/04/18 203 lb 9.6 oz (92.4 kg)  10/14/18 203 lb (92.1 kg)  10/07/18 202 lb (91.6 kg)     Physical Exam  Constitutional: She is oriented to person, place, and time and well-developed, well-nourished, and in no distress. No distress.  HENT:  Head: Normocephalic and atraumatic.  Right Ear: Hearing, tympanic membrane, external ear and ear canal normal.  Left Ear: Hearing, tympanic membrane, external ear and ear canal normal.  Mouth/Throat: Uvula is midline and oropharynx is clear and moist. No oropharyngeal exudate.  Eyes: Pupils are equal, round, and reactive to light.  Neck: Normal range of motion. Neck supple.  Cardiovascular: Normal rate, regular rhythm and normal heart sounds.  Pulmonary/Chest: Effort normal and breath sounds normal. No accessory muscle usage. No respiratory distress. She has no decreased breath sounds. She has no wheezes. She has no rhonchi.  Musculoskeletal: Normal range of motion.        General: No edema.  Lymphadenopathy:    She has no cervical adenopathy.  Neurological: She is alert and oriented to person, place, and time. Gait normal.  Skin: Skin is warm and dry. She is  not diaphoretic.  Psychiatric: Mood, memory, affect and judgment normal.  Nursing note and vitals reviewed.     Lab Results:  CBC No results found for: WBC, RBC, HGB, HCT, PLT, MCV, MCH, MCHC, RDW, LYMPHSABS, MONOABS, EOSABS, BASOSABS  BMET No results found for: NA, K, CL, CO2, GLUCOSE, BUN, CREATININE, CALCIUM, GFRNONAA, GFRAA  BNP No results found for: BNP  ProBNP No results found for: PROBNP    Assessment & Plan:   Asthma Assessment: Well controlled asthma today Maintained on Nucala as well as Dulera 200 Has not used rescue inhaler in last 2 months  Plan: Continue Dulera 200 Continue Nucala injections Continue risk inhaler as needed Follow-up with our office in 6 months    Return in about 6 months (around 08/16/2019), or if symptoms worsen or fail to improve, for Follow up with Dr. Lamonte Sakai.   Lauraine Rinne, NP 02/13/2019   This appointment was 18 minutes long with over 50% of the time in direct face-to-face patient care, assessment, plan of care, and follow-up.

## 2019-02-13 ENCOUNTER — Other Ambulatory Visit: Payer: Self-pay

## 2019-02-13 ENCOUNTER — Encounter: Payer: Self-pay | Admitting: Pulmonary Disease

## 2019-02-13 ENCOUNTER — Ambulatory Visit: Payer: Federal, State, Local not specified - PPO | Admitting: Emergency Medicine

## 2019-02-13 ENCOUNTER — Ambulatory Visit (INDEPENDENT_AMBULATORY_CARE_PROVIDER_SITE_OTHER): Payer: Medicare Other | Admitting: Pulmonary Disease

## 2019-02-13 DIAGNOSIS — J4520 Mild intermittent asthma, uncomplicated: Secondary | ICD-10-CM

## 2019-02-13 MED ORDER — MOMETASONE FURO-FORMOTEROL FUM 200-5 MCG/ACT IN AERO: 2.0000 | INHALATION_SPRAY | Freq: Two times a day (BID) | RESPIRATORY_TRACT | 0 refills | Status: DC

## 2019-02-13 NOTE — Patient Instructions (Addendum)
Continue Nucala   Continue Dulera 200 >>> 2 puffs in the morning right when you wake up, rinse out your mouth after use, 12 hours later 2 puffs, rinse after use >>> Take this daily, no matter what >>> This is not a rescue inhaler   Only use your albuterol as a rescue medication to be used if you can't catch your breath by resting or doing a relaxed purse lip breathing pattern.  - The less you use it, the better it will work when you need it. - Ok to use up to 2 puffs every 4 hours if you must but call for immediate appointment if use goes up over your usual need - Don't leave home without it !! (think of it like the spare tire for your car)   Return in about 6 months (around 08/16/2019), or if symptoms worsen or fail to improve, for Follow up with Dr. Lamonte Sakai.  Coronavirus (COVID-19) Are you at risk?  Are you at risk for the Coronavirus (COVID-19)?  To be considered HIGH RISK for Coronavirus (COVID-19), you have to meet the following criteria:  . Traveled to [place], [place], [place], [place] or [place]; or in the [place] to [place], [place], [place], or [place]; and have fever, cough, and shortness of breath within the last 2 weeks of travel OR . Been in close contact with a person diagnosed with COVID-19 within the last 2 weeks and have fever, cough, and shortness of breath . IF YOU DO NOT MEET THESE CRITERIA, YOU ARE CONSIDERED LOW RISK FOR COVID-19.  What to do if you are HIGH RISK for COVID-19?  Marland Kitchen If you are having a medical emergency, call 911. . Seek medical care right away. Before you go to a doctor's office, urgent care or emergency department, call ahead and tell them about your recent travel, contact with someone diagnosed with COVID-19, and your symptoms. You should receive instructions from your physician's office regarding next steps of care.  . When you arrive at healthcare provider, tell the healthcare staff immediately you have returned from visiting [place],  [place], [place], [place] or [place]; or traveled in the [place] to [place], [place], [place], or [place]; in the last two weeks or you have been in close contact with a person diagnosed with COVID-19 in the last 2 weeks.   . Tell the health care staff about your symptoms: fever, cough and shortness of breath. . After you have been seen by a medical provider, you will be either: o Tested for (COVID-19) and discharged home on quarantine except to seek medical care if symptoms worsen, and asked to  - Stay home and avoid contact with others until you get your results (4-5 days)  - Avoid travel on public transportation if possible (such as bus, train, or airplane) or o Sent to the Emergency Department by EMS for evaluation, COVID-19 testing, and possible admission depending on your condition and test results.  What to do if you are LOW RISK for COVID-19?  Reduce your risk of any infection by using the same precautions used for avoiding the common cold or flu:  Marland Kitchen Wash your hands often with soap and warm water for at least 20 seconds.  If soap and water are not readily available, use an alcohol-based hand sanitizer with at least 60% alcohol.  . If coughing or sneezing, cover your mouth and nose by coughing or sneezing into the elbow areas of your shirt or coat,  into a tissue or into your sleeve (not your hands). . Avoid shaking hands with others and consider head nods or verbal greetings only. . Avoid touching your eyes, nose, or mouth with unwashed hands.  . Avoid close contact with people who are sick. . Avoid places or events with large numbers of people in one location, like concerts or sporting events. . Carefully consider travel plans you have or are making. . If you are planning any travel outside or inside the [place], visit the CDC's Travelers' Health webpage for the latest health notices. . If you have some symptoms but not all symptoms, continue to monitor at home and seek medical  attention if your symptoms worsen. . If you are having a medical emergency, call 911.   [place] / e-Visit: [email or web address]         [facility]: [facility]: [phone]                   [facility]: [phone]           It is flu season:   >>> Best ways to protect herself from the flu: Receive the yearly flu vaccine, practice good hand hygiene washing with soap and also using hand sanitizer when available, eat a nutritious meals, get adequate rest, hydrate appropriately   Please contact the office if your symptoms worsen or you have concerns that you are not improving.   Thank you for choosing [facility] for your healthcare, and for allowing [place] to partner with you on your healthcare journey. I am thankful to be able to provide care to you today.   Wyn Quaker FNP-C

## 2019-02-13 NOTE — Assessment & Plan Note (Signed)
Assessment: Well controlled asthma today Maintained on Nucala as well as Dulera 200 Has not used rescue inhaler in last 2 months  Plan: Continue Dulera 200 Continue Nucala injections Continue risk inhaler as needed Follow-up with our office in 6 months

## 2019-02-16 ENCOUNTER — Telehealth: Payer: Self-pay | Admitting: Emergency Medicine

## 2019-02-16 NOTE — Telephone Encounter (Signed)
Nucala Order: 100mg  #1 Vial Order Date: 02/16/2019 Expected date of arrival: 02/17/2019 Ordered by: Desmond Dike, [place]: Nigel Mormon

## 2019-02-17 NOTE — Telephone Encounter (Signed)
Nucala Shipment Received: 100mg  #1 vial Medication arrival date: 02/17/2019 Lot #: [id number] Exp date: 06/2022 Received by: TBS

## 2019-02-24 DIAGNOSIS — M9905 Segmental and somatic dysfunction of pelvic region: Secondary | ICD-10-CM | POA: Diagnosis not present

## 2019-02-24 DIAGNOSIS — M5032 Other cervical disc degeneration, mid-cervical region, unspecified level: Secondary | ICD-10-CM | POA: Diagnosis not present

## 2019-02-24 DIAGNOSIS — M9903 Segmental and somatic dysfunction of lumbar region: Secondary | ICD-10-CM | POA: Diagnosis not present

## 2019-02-24 DIAGNOSIS — M5136 Other intervertebral disc degeneration, lumbar region: Secondary | ICD-10-CM | POA: Diagnosis not present

## 2019-02-24 DIAGNOSIS — M5137 Other intervertebral disc degeneration, lumbosacral region: Secondary | ICD-10-CM | POA: Diagnosis not present

## 2019-02-24 DIAGNOSIS — M9902 Segmental and somatic dysfunction of thoracic region: Secondary | ICD-10-CM | POA: Diagnosis not present

## 2019-02-24 DIAGNOSIS — S29012A Strain of muscle and tendon of back wall of thorax, initial encounter: Secondary | ICD-10-CM | POA: Diagnosis not present

## 2019-02-24 DIAGNOSIS — M9901 Segmental and somatic dysfunction of cervical region: Secondary | ICD-10-CM | POA: Diagnosis not present

## 2019-02-26 ENCOUNTER — Ambulatory Visit: Payer: Federal, State, Local not specified - PPO

## 2019-02-26 ENCOUNTER — Ambulatory Visit (INDEPENDENT_AMBULATORY_CARE_PROVIDER_SITE_OTHER): Payer: Medicare Other

## 2019-02-26 ENCOUNTER — Other Ambulatory Visit: Payer: Self-pay

## 2019-02-26 DIAGNOSIS — J4540 Moderate persistent asthma, uncomplicated: Secondary | ICD-10-CM

## 2019-02-26 MED ORDER — MEPOLIZUMAB 100 MG ~~LOC~~ SOLR
100.0000 mg | Freq: Once | SUBCUTANEOUS | Status: AC
  Administered 2019-02-26: 100 mg via SUBCUTANEOUS

## 2019-02-26 NOTE — Progress Notes (Signed)
Have you been hospitalized within the last 10 days?  No Do you have a fever?  No Do you have a cough?  No Do you have a headache or sore throat? No  

## 2019-03-02 DIAGNOSIS — M25571 Pain in right ankle and joints of right foot: Secondary | ICD-10-CM | POA: Diagnosis not present

## 2019-03-16 ENCOUNTER — Telehealth: Payer: Self-pay | Admitting: Emergency Medicine

## 2019-03-16 NOTE — Telephone Encounter (Signed)
Nucala Order: 100mg  #1 Vial Order Date: 03/16/2019 Expected date of arrival: 03/17/2019 Ordered by: Desmond Dike, [place]: Nigel Mormon

## 2019-03-17 NOTE — Telephone Encounter (Signed)
Nucala Shipment Received: 100mg  #1 vial Medication arrival date: 03/17/2019 Lot #: [id number] Exp date: 06/2022 Received by: Laurette Schimke

## 2019-03-23 DIAGNOSIS — I10 Essential (primary) hypertension: Secondary | ICD-10-CM | POA: Diagnosis not present

## 2019-03-23 DIAGNOSIS — H409 Unspecified glaucoma: Secondary | ICD-10-CM | POA: Diagnosis not present

## 2019-03-23 DIAGNOSIS — G4700 Insomnia, unspecified: Secondary | ICD-10-CM | POA: Diagnosis not present

## 2019-03-23 DIAGNOSIS — F339 Major depressive disorder, recurrent, unspecified: Secondary | ICD-10-CM | POA: Diagnosis not present

## 2019-03-23 DIAGNOSIS — J449 Chronic obstructive pulmonary disease, unspecified: Secondary | ICD-10-CM | POA: Diagnosis not present

## 2019-03-25 ENCOUNTER — Telehealth: Payer: Self-pay | Admitting: *Deleted

## 2019-03-25 DIAGNOSIS — M5137 Other intervertebral disc degeneration, lumbosacral region: Secondary | ICD-10-CM | POA: Diagnosis not present

## 2019-03-25 DIAGNOSIS — M9902 Segmental and somatic dysfunction of thoracic region: Secondary | ICD-10-CM | POA: Diagnosis not present

## 2019-03-25 DIAGNOSIS — M9905 Segmental and somatic dysfunction of pelvic region: Secondary | ICD-10-CM | POA: Diagnosis not present

## 2019-03-25 DIAGNOSIS — M9901 Segmental and somatic dysfunction of cervical region: Secondary | ICD-10-CM | POA: Diagnosis not present

## 2019-03-25 DIAGNOSIS — S29012A Strain of muscle and tendon of back wall of thorax, initial encounter: Secondary | ICD-10-CM | POA: Diagnosis not present

## 2019-03-25 DIAGNOSIS — M5032 Other cervical disc degeneration, mid-cervical region, unspecified level: Secondary | ICD-10-CM | POA: Diagnosis not present

## 2019-03-25 DIAGNOSIS — M9903 Segmental and somatic dysfunction of lumbar region: Secondary | ICD-10-CM | POA: Diagnosis not present

## 2019-03-25 DIAGNOSIS — M5136 Other intervertebral disc degeneration, lumbar region: Secondary | ICD-10-CM | POA: Diagnosis not present

## 2019-03-25 NOTE — Telephone Encounter (Signed)
I spoke with pt, she just found out 2 hrs ago her Dr she went to see had [place]. The Dr was dx 19 days ago. I asked pt if she had had any of these sx: fever,any cough that is abnormal,loss of smell,headache,weakness,abn SOB,nausea/vomitting, or diarrhea. No, she has not. Please advise if we can send an encounter to [facility], to see if they can schedule her for COVID testing.. Routing to [place] and [place] to make them aware.

## 2019-03-26 ENCOUNTER — Ambulatory Visit: Payer: Federal, State, Local not specified - PPO

## 2019-03-26 NOTE — Telephone Encounter (Signed)
Isn't it still protocol for pt to be tested before she comes in our office? The pt was indirectly exposed. Keeping mind pt declined being tested for COVID. Please advise.

## 2019-03-26 NOTE — Telephone Encounter (Signed)
Pt is calling back for an update. Cb is [phone]

## 2019-03-26 NOTE — Telephone Encounter (Signed)
Spoke with patient and made her aware of TN's recommendations. While on the phone, patient explained that she was actually never around the doctor who tested positive. She stated that based on the way TS asked her the question, she responded yes. The doctor tested positive around 19 days ago but she wasn't around the doctor until yesterday. The doctor self quarantined and is already back at work with no symptoms or residuals.   She declined being tested for covid. She still wants to know if she can come in for her Nucala injection.

## 2019-03-26 NOTE — Telephone Encounter (Signed)
I am confused. It sounds like the patient was never actually exposed. If the doctor tested negative and was allowed to return to work and see patient's then the patient was not exposed and will not need to be tested.

## 2019-03-26 NOTE — Telephone Encounter (Signed)
Spoke with patient. Advised her that I would route the message to APP of the day based on her situation, she verbalized understanding. When I spoke with the patient, she stated that she is still not having any symptoms. She is just concerned because she was in close contact with someone who tested positive.   Tonya, please advise since RB is not in the office. Thanks!

## 2019-03-26 NOTE — Telephone Encounter (Signed)
Called and spoke to Ana Hughes. INformed her she can come in for her injection and does not need to be tested as her 'exposure' was after the provider was already cleared to come back to work. Appt for Ana Hughes's injection has been made for 7/10, Ana Hughes was screened for COVID and was negative. Alroy Bailiff has been made aware. Nothing further needed at this time.

## 2019-03-26 NOTE — Telephone Encounter (Signed)
Yes. Please order testing at testing site. Thanks.

## 2019-03-27 ENCOUNTER — Ambulatory Visit (INDEPENDENT_AMBULATORY_CARE_PROVIDER_SITE_OTHER): Payer: Medicare Other

## 2019-03-27 ENCOUNTER — Other Ambulatory Visit: Payer: Self-pay

## 2019-03-27 DIAGNOSIS — J4540 Moderate persistent asthma, uncomplicated: Secondary | ICD-10-CM

## 2019-03-27 MED ORDER — MEPOLIZUMAB 100 MG ~~LOC~~ SOLR
100.0000 mg | Freq: Once | SUBCUTANEOUS | Status: AC
  Administered 2019-03-27: 15:00:00 100 mg via SUBCUTANEOUS

## 2019-03-27 NOTE — Progress Notes (Signed)
Have you been hospitalized within the last 10 days?  No Do you have a fever?  No Do you have a cough?  No Do you have a headache or sore throat? No  

## 2019-04-13 DIAGNOSIS — M25561 Pain in right knee: Secondary | ICD-10-CM | POA: Diagnosis not present

## 2019-04-13 DIAGNOSIS — Z96651 Presence of right artificial knee joint: Secondary | ICD-10-CM | POA: Diagnosis not present

## 2019-04-13 DIAGNOSIS — Z96652 Presence of left artificial knee joint: Secondary | ICD-10-CM | POA: Diagnosis not present

## 2019-04-14 ENCOUNTER — Telehealth: Payer: Self-pay | Admitting: Emergency Medicine

## 2019-04-14 NOTE — Telephone Encounter (Signed)
Nucala Order: 100mg  #1 Vial Order Date: 04/14/2019 Expected date of arrival: 04/15/2019 Ordered by: Desmond Dike, [place]: Nigel Mormon

## 2019-04-15 NOTE — Telephone Encounter (Signed)
Nucala Shipment Received: 100mg  #1 vial Medication arrival date: 04/15/2019 Lot #: [id number] Exp date: 10/2021 Received by: Desmond Dike, [place]

## 2019-04-22 ENCOUNTER — Other Ambulatory Visit (INDEPENDENT_AMBULATORY_CARE_PROVIDER_SITE_OTHER): Payer: Medicare Other

## 2019-04-22 ENCOUNTER — Encounter: Payer: Self-pay | Admitting: Family Medicine

## 2019-04-22 ENCOUNTER — Telehealth: Payer: Self-pay | Admitting: *Deleted

## 2019-04-22 ENCOUNTER — Other Ambulatory Visit: Payer: Self-pay

## 2019-04-22 ENCOUNTER — Ambulatory Visit (INDEPENDENT_AMBULATORY_CARE_PROVIDER_SITE_OTHER): Payer: Medicare Other | Admitting: Family Medicine

## 2019-04-22 DIAGNOSIS — E559 Vitamin D deficiency, unspecified: Secondary | ICD-10-CM

## 2019-04-22 DIAGNOSIS — I10 Essential (primary) hypertension: Secondary | ICD-10-CM

## 2019-04-22 DIAGNOSIS — E785 Hyperlipidemia, unspecified: Secondary | ICD-10-CM

## 2019-04-22 DIAGNOSIS — M25571 Pain in right ankle and joints of right foot: Secondary | ICD-10-CM | POA: Diagnosis not present

## 2019-04-22 DIAGNOSIS — F324 Major depressive disorder, single episode, in partial remission: Secondary | ICD-10-CM

## 2019-04-22 LAB — BASIC METABOLIC PANEL
BUN: 21 mg/dL (ref 6–23)
CO2: 26 mEq/L (ref 19–32)
Calcium: 9.5 mg/dL (ref 8.4–10.5)
Chloride: 104 mEq/L (ref 96–112)
Creatinine, Ser: 0.64 mg/dL (ref 0.40–1.20)
GFR: 92.72 mL/min (ref >60.00)
Glucose, Bld: 129 mg/dL — ABNORMAL HIGH (ref 70–99)
Potassium: 3.8 mEq/L (ref 3.5–5.1)
Sodium: 137 mEq/L (ref 135–145)

## 2019-04-22 LAB — VITAMIN D 25 HYDROXY (VIT D DEFICIENCY, FRACTURES): VITD: 49.47 ng/mL (ref 30.00–100.00)

## 2019-04-22 MED ORDER — FLUOXETINE HCL 40 MG PO CAPS: 40.0000 mg | ORAL_CAPSULE | Freq: Every day | ORAL | 1 refills | Status: DC

## 2019-04-22 NOTE — Progress Notes (Signed)
Virtual Visit via Video Note   I connected with Ana Gable on 04/22/19 by a video enabled telemedicine application and verified that I am speaking with the correct person using two identifiers.  Location patient: home Location provider:work office Persons participating in the virtual visit: patient, provider  I discussed the limitations of evaluation and management by telemedicine and the availability of in person appointments. The patient expressed understanding and agreed to proceed.   HPI:  Ana Hughes is a 68 yo female establishing care today. She just moved to [place] from [place]. she is now living with her daughter and her family.  Former PCP: Dr Jetty Duhamel. Last CPE: 11/2018.  She reports history of mild hyperlipidemia, hypertension, depression, anxiety, OA, and asthma among some.  She follows with pulmonologist regularly, asthma symptoms better controlled with new medication: Nucala. She is also on Dulera 200-5 mcg 2 puff twice daily. She has not needed albuterol neb treatment in a while. Negative for cough, exertional dyspnea, wheezing.  Hypertension, currently she is on amlodipine 5 mg daily. She is not checking BP at home. Negative for unusual headache, visual changes, chest pain, dyspnea, palpitations, abdominal pain, nausea, vomiting, focal deficit, or edema.  Hyperlipidemia, mild, she is on nonpharmacologic treatment.  Vitamin D deficiency, currently she is on ergocalciferol 50,000 units weekly.  She is not sure when her 33 OH vitamin D was checked last.  Anxiety and depression, she has been on fluoxetine 40 mg daily for years. Medication was started after the death of her husband. She still feels like medication is helping. Now that she is living with her daughter, there is some "drama" in her life, causing some stress. She denies depressed mood or suicidal thoughts.   ROS: See pertinent positives and negatives per HPI.  Past Medical History:  Diagnosis Date  .  Arthritis   . Asthma   . Depression   . Glaucoma   . History of colon polyps   . Hypertension     Past Surgical History:  Procedure Laterality Date  . [place]  . REPLACEMENT TOTAL KNEE BILATERAL Bilateral    left knee, 2005, right knee 2004  . TOTAL HIP ARTHROPLASTY Bilateral 012, right hip, 2013 left hip    Family History  Problem Relation Age of Onset  . Cancer Mother   . Hypertension Mother   . Cancer Father   . Depression Daughter   . Drug abuse Daughter   . Depression Daughter   . Drug abuse Daughter   . Heart attack Daughter   . Depression Daughter   . Drug abuse Daughter   . Mental illness Daughter     Social History   Socioeconomic History  . Marital status: Widowed    Spouse name: Not on file  . Number of children: 3  . Years of education: Not on file  . Highest education level: Not on file  Occupational History  . Not on file  Social Needs  . Financial resource strain: Not on file  . Food insecurity    Worry: Not on file    Inability: Not on file  . Transportation needs    Medical: Not on file    Non-medical: Not on file  Tobacco Use  . Smoking status: Never Smoker  . Smokeless tobacco: Never Used  Substance and Sexual Activity  . Alcohol use: Yes  . Drug use: Never  . Sexual activity: Not Currently  Lifestyle  . Physical activity    Days  per week: Not on file    Minutes per session: Not on file  . Stress: Not on file  Relationships  . Social Herbalist on phone: Not on file    Gets together: Not on file    Attends religious service: Not on file    Active member of club or organization: Not on file    Attends meetings of clubs or organizations: Not on file    Relationship status: Not on file  . Intimate partner violence    Fear of current or ex partner: Not on file    Emotionally abused: Not on file    Physically abused: Not on file    Forced sexual activity: Not on file  Other Topics Concern  .  Not on file  Social History Narrative  . Not on file     Current Outpatient Medications:  .  albuterol (PROVENTIL HFA;VENTOLIN HFA) 108 (90 Base) MCG/ACT inhaler, Inhale 2 puffs into the lungs every 4 (four) hours as needed for wheezing or shortness of breath., Disp: 1 Inhaler, Rfl: 5 .  albuterol (PROVENTIL) (2.5 MG/3ML) 0.083% nebulizer solution, Take 3 mLs (2.5 mg total) by nebulization every 6 (six) hours as needed for wheezing or shortness of breath., Disp: 360 mL, Rfl: 3 .  amLODipine (NORVASC) 5 MG tablet, Take 5 mg by mouth daily., Disp: , Rfl:  .  bimatoprost (LUMIGAN) 0.01 % SOLN, Place 1 drop into both eyes at bedtime., Disp: , Rfl:  .  clotrimazole (MYCELEX) 10 MG troche, Take 1 tablet (10 mg total) by mouth 5 (five) times daily., Disp: 35 tablet, Rfl: 0 .  dorzolamide (TRUSOPT) 2 % ophthalmic solution, Place 1 drop into both eyes 3 (three) times daily., Disp: , Rfl:  .  ergocalciferol (VITAMIN D2) 1.25 MG (50000 UT) capsule, Take 1 capsule (50,000 Units total) by mouth once a week., Disp: 4 capsule, Rfl: 5 .  FLUoxetine (PROZAC) 40 MG capsule, Take 1 capsule (40 mg total) by mouth daily., Disp: 90 capsule, Rfl: 1 .  meloxicam (MOBIC) 15 MG tablet, Take 15 mg by mouth daily as needed., Disp: , Rfl:  .  mometasone-formoterol (DULERA) 200-5 MCG/ACT AERO, Inhale 2 puffs into the lungs 2 (two) times a day., Disp: 1 Inhaler, Rfl: 0  EXAM:  VITALS per patient if applicable:BP 782/95   Pulse 80   Ht 5\' 1"  (1.549 m)   BMI 39.07 kg/m   GENERAL: alert, oriented, appears well and in no acute distress  HEENT: atraumatic, conjunctiva clear, no obvious facial abnormalities on inspection.    NECK: normal movements of the head and neck  LUNGS: on inspection no signs of respiratory distress, breathing rate appears normal, no obvious gross SOB, gasping or wheezing  CV: no obvious cyanosis  Ana: moves all visible extremities without noticeable abnormality  PSYCH/NEURO: pleasant and  cooperative, no obvious depression or anxiety, speech and thought processing grossly intact  ASSESSMENT AND PLAN:  Discussed the following assessment and plan: Orders Placed This Encounter  Procedures  . Basic metabolic panel  . VITAMIN D 25 Hydroxy (Vit-D Deficiency, Fractures)   Lab Results  Component Value Date   CREATININE 0.64 04/22/2019   BUN 21 04/22/2019   NA 137 04/22/2019   K 3.8 04/22/2019   CL 104 04/22/2019   CO2 26 04/22/2019    Vitamin D deficiency, unspecified For now she will continue ergocalciferol 50,000 units weekly, dose will be adjusted according to 25 OH vitamin D results.  Hypertension,  essential, benign Continue amlodipine 5 mg daily. We will check her BP today here when she is at the lab. Continue low-salt diet. It is recommended to check BP at home periodically.  Ankle pain, right We discussed some side effects of chronic NSAIDs use, including increasing risk of CVD. Meloxicam 50 mg is helping with ankle pain, so she will continue for now. BMP will be scheduled for today.  Depression, major, in partial remission ([place]) Problem is well controlled with fluoxetine 40 mg daily, so no changes today. Prescription sent to her pharmacy.  Hyperlipidemia, unspecified For now recommend continuing low-fat diet. I have not received records from former PCP. We will plan on checking lipid panel with her next physical.  30 min of virtual visit.   I discussed the assessment and treatment plan with the patient. She was provided an opportunity to ask questions and all were answered. She  agreed with the plan and demonstrated an understanding of the instructions.    Return in about 6 months (around 10/23/2019).    Ana Augusta Martinique, MD

## 2019-04-22 NOTE — Assessment & Plan Note (Signed)
Continue amlodipine 5 mg daily. We will check her BP today here when she is at the lab. Continue low-salt diet. It is recommended to check BP at home periodically.

## 2019-04-22 NOTE — Telephone Encounter (Signed)
Patient has already had appointment with Dr. Martinique and scheduled lab appointment for this afternoon. Nothing further needed at this time.  Copied from [place] [id number]. Topic: Appointment Scheduling - Scheduling Inquiry for Clinic >> Apr 22, 2019 11:04 AM Ana Hughes wrote: Reason for CRM: Pt stated she has not received the link for her virtual appt at 11 am with Dr. Martinique. Attempted to transfer pt to the office but there was no answer after several attempts. Pt requests call back.

## 2019-04-22 NOTE — Assessment & Plan Note (Signed)
We discussed some side effects of chronic NSAIDs use, including increasing risk of CVD. Meloxicam 50 mg is helping with ankle pain, so she will continue for now. BMP will be scheduled for today.

## 2019-04-22 NOTE — Assessment & Plan Note (Signed)
Problem is well controlled with fluoxetine 40 mg daily, so no changes today. Prescription sent to her pharmacy.

## 2019-04-22 NOTE — Assessment & Plan Note (Signed)
For now she will continue ergocalciferol 50,000 units weekly, dose will be adjusted according to 25 OH vitamin D results.

## 2019-04-22 NOTE — Assessment & Plan Note (Signed)
For now recommend continuing low-fat diet. I have not received records from former PCP. We will plan on checking lipid panel with her next physical.

## 2019-04-23 ENCOUNTER — Telehealth: Payer: Self-pay | Admitting: Emergency Medicine

## 2019-04-23 MED ORDER — EPINEPHRINE 0.3 MG/0.3ML IJ SOAJ: 0.3000 mg | Freq: Once | INTRAMUSCULAR | 11 refills | Status: DC

## 2019-04-23 NOTE — Telephone Encounter (Signed)
Called and spoke with Patient. Informed Patient to bring epipen to injection scheduled 04/24/19.  Patient stated she has never had a epipen and did not understand why she needed one.  Explained epipens are needed when Patient is having injections in case of adverse reactions and Patient should bring them to each injection. Patient stated she used [facility], [place]. Epipen prescription was placed, and alert was given, due to allergy to [place], (brinzolamide-brimonidine).  [facility] pharmacy, attempted to speak with pharmacy.  The man that answered the phone said the pharmacist was administering vaccines,and no one else could answer the question. Called back 15 minutes later and spoke with Pharmacist.  She was unsure of it being contraindicated.  She stated she did not see any reason why Patient could not receive a epi pen. Burman Nieves, RN supervisor aware. Epipen order placed. Nothing further at this time.

## 2019-04-24 ENCOUNTER — Ambulatory Visit (INDEPENDENT_AMBULATORY_CARE_PROVIDER_SITE_OTHER): Payer: Medicare Other

## 2019-04-24 ENCOUNTER — Other Ambulatory Visit: Payer: Self-pay

## 2019-04-24 DIAGNOSIS — S29012A Strain of muscle and tendon of back wall of thorax, initial encounter: Secondary | ICD-10-CM | POA: Diagnosis not present

## 2019-04-24 DIAGNOSIS — M9901 Segmental and somatic dysfunction of cervical region: Secondary | ICD-10-CM | POA: Diagnosis not present

## 2019-04-24 DIAGNOSIS — M5136 Other intervertebral disc degeneration, lumbar region: Secondary | ICD-10-CM | POA: Diagnosis not present

## 2019-04-24 DIAGNOSIS — M9905 Segmental and somatic dysfunction of pelvic region: Secondary | ICD-10-CM | POA: Diagnosis not present

## 2019-04-24 DIAGNOSIS — M9903 Segmental and somatic dysfunction of lumbar region: Secondary | ICD-10-CM | POA: Diagnosis not present

## 2019-04-24 DIAGNOSIS — M5032 Other cervical disc degeneration, mid-cervical region, unspecified level: Secondary | ICD-10-CM | POA: Diagnosis not present

## 2019-04-24 DIAGNOSIS — J4540 Moderate persistent asthma, uncomplicated: Secondary | ICD-10-CM | POA: Diagnosis not present

## 2019-04-24 DIAGNOSIS — M5137 Other intervertebral disc degeneration, lumbosacral region: Secondary | ICD-10-CM | POA: Diagnosis not present

## 2019-04-24 DIAGNOSIS — M9902 Segmental and somatic dysfunction of thoracic region: Secondary | ICD-10-CM | POA: Diagnosis not present

## 2019-04-24 MED ORDER — MEPOLIZUMAB 100 MG ~~LOC~~ SOLR
100.0000 mg | Freq: Once | SUBCUTANEOUS | Status: AC
  Administered 2019-04-24: 10:00:00 100 mg via SUBCUTANEOUS

## 2019-04-24 MED ORDER — MEPOLIZUMAB 100 MG ~~LOC~~ SOLR: 100.0000 mg | SUBCUTANEOUS | Status: DC

## 2019-04-24 NOTE — Progress Notes (Signed)
Have you been hospitalized within the last 10 days?  No Do you have a fever?  No Do you have a cough?  No Do you have a headache or sore throat? No Do you have your Epi Pen visible and is it within date?  No   Patient stated that she was sent a refill on her Epipen yesterday but was informed that Rx will not be ready to fill until 8/10.  Advised patient moving forward, and Epipen will need to be visible and within date to receive injection.  Patient voiced her understanding and denied any further questions.

## 2019-04-26 ENCOUNTER — Encounter: Payer: Self-pay | Admitting: Family Medicine

## 2019-04-27 ENCOUNTER — Other Ambulatory Visit: Payer: Self-pay | Admitting: Emergency Medicine

## 2019-05-11 ENCOUNTER — Telehealth: Payer: Self-pay | Admitting: Emergency Medicine

## 2019-05-11 NOTE — Telephone Encounter (Signed)
Nucala Order: 100mg  #1 Vial Order Date: 05/11/2019 Expected date of arrival: 05/12/2019 Ordered by: Desmond Dike, [place]: Nigel Mormon

## 2019-05-12 NOTE — Telephone Encounter (Signed)
Nucala Shipment Received: 100mg  #1 vial Medication arrival date: 05/12/19 Lot #: [id number] Exp date: 06/17/2022 Received by: Leander Rams, LPN

## 2019-05-18 ENCOUNTER — Other Ambulatory Visit: Payer: Self-pay | Admitting: Family Medicine

## 2019-05-18 NOTE — Telephone Encounter (Signed)
Requested medication (s) are due for refill today: not specified  Requested medication (s) are on the active medication list:yes   Last refill: 04/22/2019  Future visit scheduled no  Notes to clinic Historical provider  Requested Prescriptions  Pending Prescriptions Disp Refills   meloxicam (MOBIC) 15 MG tablet      Sig: Take 1 tablet (15 mg total) by mouth daily as needed.     Analgesics:  COX2 Inhibitors Failed - 05/18/2019  5:26 PM      Failed - HGB in normal range and within 360 days    No results found for: HGB, HGBKUC, HGBPOCKUC       Passed - Cr in normal range and within 360 days    Creatinine, Ser  Date Value Ref Range Status  04/22/2019 0.64 0.40 - 1.20 mg/dL Final         Passed - Patient is not pregnant      Passed - Valid encounter within last 12 months    Recent Outpatient Visits          3 weeks ago Hypertension, essential, benign   Therapist, music at [facility] [place], Malka So, MD

## 2019-05-18 NOTE — Telephone Encounter (Signed)
Copied from [place] [id number]. Topic: Quick Communication - Rx Refill/Question >> May 18, 2019  5:01 PM Mcneil, Ja-Kwan wrote: Medication: meloxicam (MOBIC) 15 MG tablet  Has the patient contacted their pharmacy? yes   Preferred Pharmacy (with phone number or street name): [facility] [address], [place] - [address] [phone] (Phone) [phone] (Fax)  Agent: Please be advised that RX refills may take up to 3 business days. We ask that you follow-up with your pharmacy.

## 2019-05-19 MED ORDER — MELOXICAM 15 MG PO TABS: 15.0000 mg | ORAL_TABLET | Freq: Every day | ORAL | 0 refills | Status: DC | PRN

## 2019-05-22 ENCOUNTER — Ambulatory Visit (INDEPENDENT_AMBULATORY_CARE_PROVIDER_SITE_OTHER): Payer: Medicare Other

## 2019-05-22 ENCOUNTER — Other Ambulatory Visit: Payer: Self-pay

## 2019-05-22 DIAGNOSIS — J4540 Moderate persistent asthma, uncomplicated: Secondary | ICD-10-CM

## 2019-05-22 DIAGNOSIS — M9901 Segmental and somatic dysfunction of cervical region: Secondary | ICD-10-CM | POA: Diagnosis not present

## 2019-05-22 DIAGNOSIS — M9903 Segmental and somatic dysfunction of lumbar region: Secondary | ICD-10-CM | POA: Diagnosis not present

## 2019-05-22 DIAGNOSIS — S29012A Strain of muscle and tendon of back wall of thorax, initial encounter: Secondary | ICD-10-CM | POA: Diagnosis not present

## 2019-05-22 DIAGNOSIS — M5032 Other cervical disc degeneration, mid-cervical region, unspecified level: Secondary | ICD-10-CM | POA: Diagnosis not present

## 2019-05-22 DIAGNOSIS — M5137 Other intervertebral disc degeneration, lumbosacral region: Secondary | ICD-10-CM | POA: Diagnosis not present

## 2019-05-22 DIAGNOSIS — M5136 Other intervertebral disc degeneration, lumbar region: Secondary | ICD-10-CM | POA: Diagnosis not present

## 2019-05-22 DIAGNOSIS — M9902 Segmental and somatic dysfunction of thoracic region: Secondary | ICD-10-CM | POA: Diagnosis not present

## 2019-05-22 DIAGNOSIS — M9905 Segmental and somatic dysfunction of pelvic region: Secondary | ICD-10-CM | POA: Diagnosis not present

## 2019-05-22 MED ORDER — MEPOLIZUMAB 100 MG ~~LOC~~ SOLR
100.0000 mg | Freq: Once | SUBCUTANEOUS | Status: AC
  Administered 2019-05-22: 100 mg via SUBCUTANEOUS

## 2019-05-22 NOTE — Progress Notes (Signed)
Have you been hospitalized within the last 10 days?  No Do you have a fever?  No Do you have a cough?  No Do you have a headache or sore throat? No Do you have your Epi Pen visible and is it within date?  Yes 

## 2019-05-28 ENCOUNTER — Ambulatory Visit: Payer: Federal, State, Local not specified - PPO

## 2019-06-02 DIAGNOSIS — H401111 Primary open-angle glaucoma, right eye, mild stage: Secondary | ICD-10-CM | POA: Diagnosis not present

## 2019-06-02 DIAGNOSIS — H401122 Primary open-angle glaucoma, left eye, moderate stage: Secondary | ICD-10-CM | POA: Diagnosis not present

## 2019-06-15 ENCOUNTER — Telehealth: Payer: Self-pay | Admitting: Emergency Medicine

## 2019-06-15 NOTE — Telephone Encounter (Signed)
Nucala Order: 100mg  #1 Vial Order Date: 06/15/2019 Expected date of arrival: 06/16/2019 Ordered by: Desmond Dike, [place]  Specialty Pharmacy: Nigel Mormon

## 2019-06-16 NOTE — Telephone Encounter (Signed)
Nucala Shipment Received: 100mg  #1 vial Medication arrival date: 06/16/2019 Lot #: [id number] Exp date: 07/2022 Received by: Desmond Dike, [place]

## 2019-06-19 DIAGNOSIS — M5032 Other cervical disc degeneration, mid-cervical region, unspecified level: Secondary | ICD-10-CM | POA: Diagnosis not present

## 2019-06-19 DIAGNOSIS — S29012A Strain of muscle and tendon of back wall of thorax, initial encounter: Secondary | ICD-10-CM | POA: Diagnosis not present

## 2019-06-19 DIAGNOSIS — M9901 Segmental and somatic dysfunction of cervical region: Secondary | ICD-10-CM | POA: Diagnosis not present

## 2019-06-19 DIAGNOSIS — M9902 Segmental and somatic dysfunction of thoracic region: Secondary | ICD-10-CM | POA: Diagnosis not present

## 2019-06-19 DIAGNOSIS — M9905 Segmental and somatic dysfunction of pelvic region: Secondary | ICD-10-CM | POA: Diagnosis not present

## 2019-06-19 DIAGNOSIS — M5137 Other intervertebral disc degeneration, lumbosacral region: Secondary | ICD-10-CM | POA: Diagnosis not present

## 2019-06-19 DIAGNOSIS — M9903 Segmental and somatic dysfunction of lumbar region: Secondary | ICD-10-CM | POA: Diagnosis not present

## 2019-06-19 DIAGNOSIS — M5136 Other intervertebral disc degeneration, lumbar region: Secondary | ICD-10-CM | POA: Diagnosis not present

## 2019-06-22 ENCOUNTER — Ambulatory Visit (INDEPENDENT_AMBULATORY_CARE_PROVIDER_SITE_OTHER): Payer: Medicare Other | Admitting: Primary Care

## 2019-06-22 ENCOUNTER — Ambulatory Visit: Payer: Federal, State, Local not specified - PPO | Admitting: Emergency Medicine

## 2019-06-22 ENCOUNTER — Encounter: Payer: Self-pay | Admitting: Primary Care

## 2019-06-22 ENCOUNTER — Ambulatory Visit: Payer: Medicare Other

## 2019-06-22 ENCOUNTER — Other Ambulatory Visit: Payer: Self-pay

## 2019-06-22 VITALS — BP 140/78 | HR 53 | Ht 62.0 in | Wt 206.4 lb

## 2019-06-22 DIAGNOSIS — J4540 Moderate persistent asthma, uncomplicated: Secondary | ICD-10-CM | POA: Diagnosis not present

## 2019-06-22 DIAGNOSIS — Z23 Encounter for immunization: Secondary | ICD-10-CM

## 2019-06-22 MED ORDER — MOMETASONE FURO-FORMOTEROL FUM 200-5 MCG/ACT IN AERO: 2.0000 | INHALATION_SPRAY | Freq: Two times a day (BID) | RESPIRATORY_TRACT | 0 refills | Status: DC

## 2019-06-22 MED ORDER — MEPOLIZUMAB 100 MG ~~LOC~~ SOLR
100.0000 mg | SUBCUTANEOUS | Status: DC
  Administered 2019-06-22: 100 mg via SUBCUTANEOUS

## 2019-06-22 MED ORDER — MONTELUKAST SODIUM 10 MG PO TABS: 10.0000 mg | ORAL_TABLET | Freq: Every day | ORAL | 6 refills | Status: DC

## 2019-06-22 NOTE — Progress Notes (Signed)
Have you been hospitalized within the last 10 days?  No Do you have a fever?  No Do you have a cough?  No Do you have a headache or sore throat? No Do you have your Epi Pen visible and is it within date?  Yes 

## 2019-06-22 NOTE — Addendum Note (Signed)
Addended by: Elton Sin on: 06/22/2019 01:23 PM   Modules accepted: Orders

## 2019-06-22 NOTE — Assessment & Plan Note (Signed)
-   Stable, no recent exacerbations. Increased cough d/t seasonal allergies.  - Continue Dulera two puffs twice daily; albuterol q 6 hours prn  - Continue Nucala 100mg  q 4 weeks  - Add Singulair 10mg  at bedtime  - Received high dose influenza vaccine today - FU in 6 months or sooner if needed

## 2019-06-22 NOTE — Progress Notes (Signed)
@Patient  ID: Ana Hughes, female    DOB: [birth year], 68 y.o.   MRN: [id number]  Chief Complaint  Patient presents with  . Follow-up    C/o chest congestion x 2 wks.,used 2x yesterday and helped,cough-dry    Referring provider: Martinique, Betty G, MD  HPI: 68 year old female, never smoked. PMH significant for asthma, HTN, glaucoma. Patient of Dr. Lamonte Sakai, last seen by pulmonary NP on 02/13/19. Maintained on Dulera 200 and Nucala 100mg  q 4 weeks.    06/22/2019 Patient presents today for 3 month follow-up visit. She is doing well, no recent exacerbations requiring oral steroids. She continues Dulera 200 and Nucala injections montly. Her only complaint is increased chest congestion for a few weeks. Cough is productive and she is getting up clear mucus once daily. Associated post nasal drip. Not taking anything for cough. She used her Albuterol twice yesterday and states that her cough improved. Typically she does not require her rescue inhaler. She denies chest tightness, wheezing or shortness of breath. Afebrile. Due for influenza   Significant testing reviewed:  Outside records reviewed  > WBC 5.5, 4.2% eosinophils (05/12/2015) Most recent spiro 03/26/18 >> FEV1 1.27 L (55% predicted), FVC 1.80 L (59% predicted), ratio 71%.   There is curve on her flow volume loop. Most recent TLC 3.39 (72% predicted) 02/12/2017 Most recent DLCO (uncorrected) 15.1 (67% predicted) 02/12/2017  Allergies  Allergen Reactions  . [place] [Brinzolamide-Brimonidine]     Immunization History  Administered Date(s) Administered  . Fluad Quad(high Dose 65+) 06/22/2019  . Influenza, High Dose Seasonal PF 07/03/2018    Past Medical History:  Diagnosis Date  . Arthritis   . Asthma   . Depression   . Glaucoma   . History of colon polyps   . Hypertension     Tobacco History: Social History   Tobacco Use  Smoking Status Never Smoker  Smokeless Tobacco Never Used   Counseling given: Not Answered    Outpatient Medications Prior to Visit  Medication Sig Dispense Refill  . albuterol (PROVENTIL HFA;VENTOLIN HFA) 108 (90 Base) MCG/ACT inhaler Inhale 2 puffs into the lungs every 4 (four) hours as needed for wheezing or shortness of breath. 1 Inhaler 5  . albuterol (PROVENTIL) (2.5 MG/3ML) 0.083% nebulizer solution Take 3 mLs (2.5 mg total) by nebulization every 6 (six) hours as needed for wheezing or shortness of breath. 360 mL 3  . amLODipine (NORVASC) 5 MG tablet Take 5 mg by mouth daily.    . bimatoprost (LUMIGAN) 0.01 % SOLN Place 1 drop into both eyes at bedtime.    . dorzolamide (TRUSOPT) 2 % ophthalmic solution Place 1 drop into both eyes 3 (three) times daily.    [place] Kitchen FLUoxetine (PROZAC) 40 MG capsule Take 1 capsule (40 mg total) by mouth daily. 90 capsule 1  . meloxicam (MOBIC) 15 MG tablet Take 1 tablet (15 mg total) by mouth daily as needed. 90 tablet 0  . timolol (TIMOPTIC-XR) 0.25 % ophthalmic gel-forming Place 1 drop into the left eye daily.    . Vitamin D, Ergocalciferol, (DRISDOL) 1.25 MG (50000 UT) CAPS capsule TAKE ONE CAPSULE BY MOUTH ONCE WEEKLY 12 capsule 2  . mometasone-formoterol (DULERA) 200-5 MCG/ACT AERO Inhale 2 puffs into the lungs 2 (two) times a day. 1 Inhaler 0  . clotrimazole (MYCELEX) 10 MG troche Take 1 tablet (10 mg total) by mouth 5 (five) times daily. (Patient not taking: Reported on 06/22/2019) 35 tablet 0   No facility-administered medications prior to  visit.    Review of Systems  Review of Systems  Constitutional: Negative.   HENT: Positive for postnasal drip.   Respiratory: Positive for cough. Negative for chest tightness, shortness of breath and wheezing.   Cardiovascular: Negative.    Physical Exam  BP 140/78 (BP Location: Right Arm, Cuff Size: Normal)   Pulse (!) 53   Ht 5\' 2"  (1.575 m)   Wt 206 lb 6.4 oz (93.6 kg)   SpO2 97%   BMI 37.75 kg/m  Physical Exam Constitutional:      Appearance: Normal appearance.  HENT:     Head: Normocephalic  and atraumatic.     Nose: Nose normal.     Mouth/Throat:     Mouth: Mucous membranes are moist.     Pharynx: Oropharynx is clear.  Neck:     Musculoskeletal: Normal range of motion and neck supple.  Cardiovascular:     Rate and Rhythm: Normal rate and regular rhythm.  Pulmonary:     Effort: Pulmonary effort is normal. No respiratory distress.     Breath sounds: No wheezing.  Musculoskeletal: Normal range of motion.  Skin:    General: Skin is warm and dry.  Neurological:     General: No focal deficit present.     Mental Status: She is alert and oriented to person, place, and time. Mental status is at baseline.  Psychiatric:        Mood and Affect: Mood normal.        Behavior: Behavior normal.        Thought Content: Thought content normal.        Judgment: Judgment normal.      Lab Results:  CBC No results found for: WBC, RBC, HGB, HCT, PLT, MCV, MCH, MCHC, RDW, LYMPHSABS, MONOABS, EOSABS, BASOSABS  BMET    Component Value Date/Time   NA 137 04/22/2019 1300   K 3.8 04/22/2019 1300   CL 104 04/22/2019 1300   CO2 26 04/22/2019 1300   GLUCOSE 129 (H) 04/22/2019 1300   BUN 21 04/22/2019 1300   CREATININE 0.64 04/22/2019 1300   CALCIUM 9.5 04/22/2019 1300    BNP No results found for: BNP  ProBNP No results found for: PROBNP  Imaging: No results found.   Assessment & Plan:   Asthma - Stable, no recent exacerbations. Increased cough d/t seasonal allergies.  - Continue Dulera two puffs twice daily; albuterol q 6 hours prn  - Continue Nucala 100mg  q 4 weeks  - Add Singulair 10mg  at bedtime  - Received high dose influenza vaccine today - FU in 6 months or sooner if needed   Martyn Ehrich, NP 06/22/2019

## 2019-06-22 NOTE — Patient Instructions (Signed)
Pleasure meeting you today, glad you are doing well  Continue Dulera as prescribed and Nucala injections   Use albuterol every 6 hours as needed for chest tightness, shortness of breath or cough  Take mucinex twice daily for congestion if needed  Monitor for increased mucus productive, purulent sputum or fever  Received high dose flu vaccine today  Follow up in 6 months with Dr. Lamonte Sakai or sooner if needed

## 2019-07-20 ENCOUNTER — Other Ambulatory Visit: Payer: Self-pay

## 2019-07-20 ENCOUNTER — Ambulatory Visit (INDEPENDENT_AMBULATORY_CARE_PROVIDER_SITE_OTHER): Payer: Medicare Other

## 2019-07-20 DIAGNOSIS — J4540 Moderate persistent asthma, uncomplicated: Secondary | ICD-10-CM | POA: Diagnosis not present

## 2019-07-20 MED ORDER — MEPOLIZUMAB 100 MG ~~LOC~~ SOLR
100.0000 mg | SUBCUTANEOUS | Status: DC
  Administered 2019-07-20: 14:00:00 100 mg via SUBCUTANEOUS

## 2019-07-20 NOTE — Progress Notes (Signed)
All questions were answered by the patient before medication was administered. Have you been hospitalized in the last 10 days? No Do you have a fever? No Do you have a cough? No Do you have a headache or sore throat? No  

## 2019-07-22 DIAGNOSIS — M5137 Other intervertebral disc degeneration, lumbosacral region: Secondary | ICD-10-CM | POA: Diagnosis not present

## 2019-07-22 DIAGNOSIS — M9903 Segmental and somatic dysfunction of lumbar region: Secondary | ICD-10-CM | POA: Diagnosis not present

## 2019-07-22 DIAGNOSIS — M9901 Segmental and somatic dysfunction of cervical region: Secondary | ICD-10-CM | POA: Diagnosis not present

## 2019-07-22 DIAGNOSIS — M9902 Segmental and somatic dysfunction of thoracic region: Secondary | ICD-10-CM | POA: Diagnosis not present

## 2019-07-22 DIAGNOSIS — M5136 Other intervertebral disc degeneration, lumbar region: Secondary | ICD-10-CM | POA: Diagnosis not present

## 2019-07-22 DIAGNOSIS — M9905 Segmental and somatic dysfunction of pelvic region: Secondary | ICD-10-CM | POA: Diagnosis not present

## 2019-07-22 DIAGNOSIS — M5032 Other cervical disc degeneration, mid-cervical region, unspecified level: Secondary | ICD-10-CM | POA: Diagnosis not present

## 2019-07-22 DIAGNOSIS — S29012A Strain of muscle and tendon of back wall of thorax, initial encounter: Secondary | ICD-10-CM | POA: Diagnosis not present

## 2019-08-11 ENCOUNTER — Telehealth: Payer: Self-pay

## 2019-08-11 NOTE — Telephone Encounter (Signed)
Nucala Order: 100mg  #1 Vial Order Date: 08/11/19  Expected date of arrival: 08/12/19 Ordered by: Len Blalock, [place]: Nigel Mormon

## 2019-08-12 NOTE — Telephone Encounter (Signed)
Nucala Shipment Received: 100mg  #1 vial Medication arrival date: 08/12/19  Lot #: [id number] Exp date: 10/2022 Received by: Len Blalock, CMA

## 2019-08-20 ENCOUNTER — Other Ambulatory Visit: Payer: Self-pay

## 2019-08-20 ENCOUNTER — Ambulatory Visit (INDEPENDENT_AMBULATORY_CARE_PROVIDER_SITE_OTHER): Payer: Medicare Other

## 2019-08-20 DIAGNOSIS — J4540 Moderate persistent asthma, uncomplicated: Secondary | ICD-10-CM | POA: Diagnosis not present

## 2019-08-20 MED ORDER — MEPOLIZUMAB 100 MG ~~LOC~~ SOLR
100.0000 mg | Freq: Once | SUBCUTANEOUS | Status: AC
  Administered 2019-08-20: 100 mg via SUBCUTANEOUS

## 2019-08-20 NOTE — Progress Notes (Signed)
Have you been hospitalized within the last 10 days?  No Do you have a fever?  No Do you have a cough?  No Do you have a headache or sore throat? No Do you have your Epi Pen visible and is it within date?  No 

## 2019-08-24 DIAGNOSIS — H401122 Primary open-angle glaucoma, left eye, moderate stage: Secondary | ICD-10-CM | POA: Diagnosis not present

## 2019-08-24 DIAGNOSIS — H0102A Squamous blepharitis right eye, upper and lower eyelids: Secondary | ICD-10-CM | POA: Diagnosis not present

## 2019-08-24 DIAGNOSIS — H401111 Primary open-angle glaucoma, right eye, mild stage: Secondary | ICD-10-CM | POA: Diagnosis not present

## 2019-08-24 DIAGNOSIS — H0102B Squamous blepharitis left eye, upper and lower eyelids: Secondary | ICD-10-CM | POA: Diagnosis not present

## 2019-09-14 ENCOUNTER — Telehealth: Payer: Self-pay | Admitting: Emergency Medicine

## 2019-09-14 NOTE — Telephone Encounter (Signed)
Nucala Order: 100mg  #1 Vial Order Date: 09/14/19 Expected date of arrival: 09/15/19 Ordered by: [place]: Nigel Mormon

## 2019-09-15 NOTE — Telephone Encounter (Signed)
Nucala Shipment Received: 100mg  #1 vial Medication arrival date: 09/15/2019 Lot #: [id number] Exp date: 10/2022 Received by: Desmond Dike, [place]

## 2019-09-22 ENCOUNTER — Ambulatory Visit (INDEPENDENT_AMBULATORY_CARE_PROVIDER_SITE_OTHER): Payer: Medicare Other

## 2019-09-22 ENCOUNTER — Other Ambulatory Visit: Payer: Self-pay

## 2019-09-22 DIAGNOSIS — J4540 Moderate persistent asthma, uncomplicated: Secondary | ICD-10-CM | POA: Diagnosis not present

## 2019-09-22 MED ORDER — MEPOLIZUMAB 100 MG ~~LOC~~ SOLR
100.0000 mg | SUBCUTANEOUS | Status: DC
  Administered 2019-09-22: 100 mg via SUBCUTANEOUS

## 2019-09-22 NOTE — Progress Notes (Signed)
All questions were answered by the patient before medication was administered. Have you been hospitalized in the last 10 days? No Do you have a fever? No Do you have a cough? No Do you have a headache or sore throat? No  

## 2019-09-28 ENCOUNTER — Other Ambulatory Visit: Payer: Self-pay | Admitting: Family Medicine

## 2019-10-06 DIAGNOSIS — H401122 Primary open-angle glaucoma, left eye, moderate stage: Secondary | ICD-10-CM | POA: Diagnosis not present

## 2019-10-06 DIAGNOSIS — H01112 Allergic dermatitis of right lower eyelid: Secondary | ICD-10-CM | POA: Diagnosis not present

## 2019-10-06 DIAGNOSIS — H0102B Squamous blepharitis left eye, upper and lower eyelids: Secondary | ICD-10-CM | POA: Diagnosis not present

## 2019-10-12 ENCOUNTER — Telehealth: Payer: Self-pay | Admitting: Emergency Medicine

## 2019-10-12 NOTE — Telephone Encounter (Signed)
Nucala Order: 100mg  #1 Vial Order Date: 10/12/2019 Expected date of arrival: 10/13/2019 Ordered by: Desmond Dike, [place]  Specialty Pharmacy: Nigel Mormon

## 2019-10-13 NOTE — Telephone Encounter (Signed)
Nucala Shipment Received: 100mg  #1 vial Medication arrival date: 10/13/2019 Lot #: [id number] Exp date: 07/2021 Received by: Desmond Dike, [place]

## 2019-10-20 ENCOUNTER — Telehealth: Payer: Self-pay | Admitting: Emergency Medicine

## 2019-10-20 NOTE — Telephone Encounter (Signed)
Called the patien back and advised of the recommendations received below. Patient voiced understanding. She will try to find a location for rapid testing before the vaccination. She stated that her grandson had a possible exposure last week. Patient does not have symptoms at this time.  Patient stated she will cancel the Nucala injection and check into getting tested before vaccination on 10/23/19.  Nothing further needed at this time.

## 2019-10-20 NOTE — Telephone Encounter (Signed)
Patient called stating she is scheduled 10/22/19 for her covid vaccine and 10/23/19 for her Nucala injection. Patient stated her Yolanda Bonine, who lives with her, was exposed to [facility] Saturday.  Patient's Grandson was sitting close, with no mask, at a restaurant eating. Patient stated they were told this morning of exposure. Patient and her Yolanda Bonine are not currently having any signs or symptoms. Patient is concerned and is questioning is she can still get covid vaccine, Nucala injection, and needing to quarantine.  Patient and her Yolanda Bonine have not been testing, since this current exposure.  Message routed to [facility], NP to advise

## 2019-10-20 NOTE — Telephone Encounter (Signed)
Would separate the Nucala and Covid vaccine  by 1 week if possible   Get COVID vaccine . If possible   If not sx would go ahead and get vaccine as planned.  Could call around to areas for rapid Covid test to be tested piror to getting however if no sx may be false neg. Would wait till tomorrow to get tested if this is an option .   If she develops sx and test positive going forward call us back for recs   Please contact office for sooner follow up if symptoms do not improve or worsen or seek emergency care    Make sure they are social isolating currently .   Please contact office for sooner follow up if symptoms do not improve or worsen or seek emergency care

## 2019-10-20 NOTE — Telephone Encounter (Signed)
LVMTCB x 1 for patient. 

## 2019-10-22 ENCOUNTER — Telehealth: Payer: Self-pay | Admitting: Emergency Medicine

## 2019-10-22 NOTE — Telephone Encounter (Signed)
Called and spoke with Patient. Patient scheduled 10/29/19 at 1430 for Nucala injection.  Nothing further at this time.

## 2019-10-23 ENCOUNTER — Ambulatory Visit: Payer: Federal, State, Local not specified - PPO

## 2019-10-27 ENCOUNTER — Ambulatory Visit: Payer: Federal, State, Local not specified - PPO | Admitting: Family Medicine

## 2019-10-29 ENCOUNTER — Ambulatory Visit (INDEPENDENT_AMBULATORY_CARE_PROVIDER_SITE_OTHER): Payer: Medicare Other

## 2019-10-29 ENCOUNTER — Other Ambulatory Visit: Payer: Self-pay

## 2019-10-29 DIAGNOSIS — J4540 Moderate persistent asthma, uncomplicated: Secondary | ICD-10-CM | POA: Diagnosis not present

## 2019-10-29 MED ORDER — MEPOLIZUMAB 100 MG ~~LOC~~ SOLR
100.0000 mg | Freq: Once | SUBCUTANEOUS | Status: AC
  Administered 2019-10-29: 15:00:00 100 mg via SUBCUTANEOUS

## 2019-10-29 NOTE — Progress Notes (Signed)
Have you been hospitalized within the last 10 days?  No Do you have a fever?  No Do you have a cough?  No Do you have a headache or sore throat? No  

## 2019-11-04 DIAGNOSIS — M9902 Segmental and somatic dysfunction of thoracic region: Secondary | ICD-10-CM | POA: Diagnosis not present

## 2019-11-04 DIAGNOSIS — M5137 Other intervertebral disc degeneration, lumbosacral region: Secondary | ICD-10-CM | POA: Diagnosis not present

## 2019-11-04 DIAGNOSIS — M9905 Segmental and somatic dysfunction of pelvic region: Secondary | ICD-10-CM | POA: Diagnosis not present

## 2019-11-04 DIAGNOSIS — M5136 Other intervertebral disc degeneration, lumbar region: Secondary | ICD-10-CM | POA: Diagnosis not present

## 2019-11-04 DIAGNOSIS — M9903 Segmental and somatic dysfunction of lumbar region: Secondary | ICD-10-CM | POA: Diagnosis not present

## 2019-11-16 ENCOUNTER — Telehealth: Payer: Self-pay | Admitting: Emergency Medicine

## 2019-11-16 NOTE — Telephone Encounter (Signed)
Nucala Order: 100mg #1 Vial Order Date: 11/16/2019 Expected date of arrival: 11/17/2019 Ordered by: Neriyah Cercone, CMA  Specialty [facility]: [facility]  

## 2019-11-17 NOTE — Telephone Encounter (Signed)
Nucala Shipment Received: 100mg #1 vial Medication arrival date: 11/17/2019 Lot #: [id number] Exp date: 02/2023 Received by: Kasondra Junod, CMA   

## 2019-11-18 ENCOUNTER — Other Ambulatory Visit: Payer: Self-pay | Admitting: Emergency Medicine

## 2019-11-27 ENCOUNTER — Other Ambulatory Visit: Payer: Self-pay

## 2019-11-27 ENCOUNTER — Ambulatory Visit (INDEPENDENT_AMBULATORY_CARE_PROVIDER_SITE_OTHER): Payer: Medicare Other

## 2019-11-27 DIAGNOSIS — J4540 Moderate persistent asthma, uncomplicated: Secondary | ICD-10-CM

## 2019-11-27 MED ORDER — MEPOLIZUMAB 100 MG ~~LOC~~ SOLR
100.0000 mg | SUBCUTANEOUS | Status: DC
  Administered 2019-11-27: 100 mg via SUBCUTANEOUS

## 2019-11-27 NOTE — Progress Notes (Signed)
All questions were answered by the patient before medication was administered. Have you been hospitalized in the last 10 days? No Do you have a fever? No Do you have a cough? No Do you have a headache or sore throat? No  

## 2019-12-14 ENCOUNTER — Telehealth: Payer: Self-pay | Admitting: Emergency Medicine

## 2019-12-14 NOTE — Telephone Encounter (Signed)
Nucala Order: 100mg  #1 Vial Order Date: 12/14/2019 Expected date of arrival: 12/15/2019 Ordered by: Desmond Dike, [place]  [facility]

## 2019-12-15 NOTE — Telephone Encounter (Signed)
Nucala Shipment Received: 100mg  #1 vial Medication arrival date: 12/15/2019 Lot #: [id number] Exp date: 02/2023 Received by: Desmond Dike, [place]

## 2019-12-24 ENCOUNTER — Other Ambulatory Visit: Payer: Self-pay | Admitting: Family Medicine

## 2019-12-25 ENCOUNTER — Other Ambulatory Visit: Payer: Self-pay

## 2019-12-25 ENCOUNTER — Ambulatory Visit (INDEPENDENT_AMBULATORY_CARE_PROVIDER_SITE_OTHER): Payer: Medicare Other

## 2019-12-25 DIAGNOSIS — J4540 Moderate persistent asthma, uncomplicated: Secondary | ICD-10-CM

## 2019-12-25 MED ORDER — MEPOLIZUMAB 100 MG ~~LOC~~ SOLR
100.0000 mg | Freq: Once | SUBCUTANEOUS | Status: AC
  Administered 2019-12-25: 100 mg via SUBCUTANEOUS

## 2019-12-25 NOTE — Progress Notes (Signed)
Have you been hospitalized within the last 10 days?  No Do you have a fever?  No Do you have a cough?  No Do you have a headache or sore throat? No Do you have your Epi Pen visible and is it within date?  Yes 

## 2020-01-11 ENCOUNTER — Telehealth: Payer: Self-pay | Admitting: Emergency Medicine

## 2020-01-11 DIAGNOSIS — H401122 Primary open-angle glaucoma, left eye, moderate stage: Secondary | ICD-10-CM | POA: Diagnosis not present

## 2020-01-11 DIAGNOSIS — H401111 Primary open-angle glaucoma, right eye, mild stage: Secondary | ICD-10-CM | POA: Diagnosis not present

## 2020-01-11 NOTE — Telephone Encounter (Signed)
Nucala Order: 100mg  #1 Vial Order Date: 01/11/2020 Expected date of arrival: 01/12/2020 Ordered by: Desmond Dike, [place]  [facility]ty [facility]

## 2020-01-12 NOTE — Telephone Encounter (Signed)
Nucala Shipment Received: 100mg  #1 vial Medication arrival date: 01/12/2020 Lot #: [id number] Exp date: 02/2023 Received by: Desmond Dike, [place]

## 2020-01-22 ENCOUNTER — Ambulatory Visit (INDEPENDENT_AMBULATORY_CARE_PROVIDER_SITE_OTHER): Payer: Medicare Other

## 2020-01-22 ENCOUNTER — Other Ambulatory Visit: Payer: Self-pay | Admitting: Emergency Medicine

## 2020-01-22 ENCOUNTER — Other Ambulatory Visit: Payer: Self-pay

## 2020-01-22 DIAGNOSIS — J4540 Moderate persistent asthma, uncomplicated: Secondary | ICD-10-CM | POA: Diagnosis not present

## 2020-01-22 MED ORDER — MEPOLIZUMAB 100 MG ~~LOC~~ SOLR
100.0000 mg | Freq: Once | SUBCUTANEOUS | Status: AC
  Administered 2020-01-22: 100 mg via SUBCUTANEOUS

## 2020-01-22 NOTE — Progress Notes (Signed)
Have you been hospitalized within the last 10 days?  No Do you have a fever?  No Do you have a cough?  No Do you have a headache or sore throat? No Do you have your Epi Pen visible and is it within date?  Yes 

## 2020-02-04 ENCOUNTER — Other Ambulatory Visit: Payer: Self-pay | Admitting: Family Medicine

## 2020-02-04 DIAGNOSIS — Z1231 Encounter for screening mammogram for malignant neoplasm of breast: Secondary | ICD-10-CM

## 2020-02-08 ENCOUNTER — Telehealth: Payer: Self-pay | Admitting: Emergency Medicine

## 2020-02-08 NOTE — Telephone Encounter (Signed)
Nucala Order: 100mg #1 Vial Order Date: 02/08/2020 Expected date of arrival: 02/09/2020 Ordered by: Leilani Cespedes, CMA  Specialty Pharmacy: [facility]  

## 2020-02-09 NOTE — Telephone Encounter (Signed)
Nucala Shipment Received: 100mg #1 vial Medication arrival date: 02/09/2020 Lot #: [id number] Exp date: 06/2023 Received by: Derius Ghosh, CMA   

## 2020-02-16 ENCOUNTER — Other Ambulatory Visit: Payer: Self-pay

## 2020-02-16 ENCOUNTER — Ambulatory Visit
Admission: RE | Admit: 2020-02-16 | Discharge: 2020-02-16 | Disposition: A | Discharge status: Home / Self Care | Payer: Medicare Other | Source: Ambulatory Visit | Attending: Family Medicine | Admitting: Family Medicine

## 2020-02-16 DIAGNOSIS — Z1231 Encounter for screening mammogram for malignant neoplasm of breast: Secondary | ICD-10-CM

## 2020-02-18 ENCOUNTER — Telehealth: Payer: Self-pay | Admitting: Family Medicine

## 2020-02-18 NOTE — Telephone Encounter (Signed)
Pt would like to have a  bone density test and to schedule a colonoscopy.   Pt would like a call back at [phone]

## 2020-02-19 ENCOUNTER — Ambulatory Visit: Payer: Medicare Other

## 2020-02-21 ENCOUNTER — Other Ambulatory Visit: Payer: Self-pay | Admitting: Emergency Medicine

## 2020-02-22 ENCOUNTER — Other Ambulatory Visit: Payer: Self-pay | Admitting: *Deleted

## 2020-02-22 ENCOUNTER — Telehealth: Payer: Self-pay | Admitting: Emergency Medicine

## 2020-02-22 DIAGNOSIS — Z13820 Encounter for screening for osteoporosis: Secondary | ICD-10-CM

## 2020-02-22 DIAGNOSIS — Z1211 Encounter for screening for malignant neoplasm of colon: Secondary | ICD-10-CM

## 2020-02-22 NOTE — Telephone Encounter (Signed)
Called and spoke with Patient. Patient is due for 6 month follow up per Beth,NP, with Dr Lamonte Sakai.  Patient was offered OV with NP, but Patient refused, and request yearly follow up with Dr Lamonte Sakai.   Dr Lamonte Sakai schedule is full for June.   Recommended Patient ask at injection appointments about RB's July schedule. Will route message to front desk pool to help with getting Patient scheduled in next month,or next available with Dr Lamonte Sakai.

## 2020-02-22 NOTE — Telephone Encounter (Signed)
Referrals placed as requested. Patient informed and verbalized understanding.

## 2020-02-23 ENCOUNTER — Ambulatory Visit (INDEPENDENT_AMBULATORY_CARE_PROVIDER_SITE_OTHER): Payer: Medicare Other

## 2020-02-23 ENCOUNTER — Telehealth: Payer: Self-pay | Admitting: Gastroenterology

## 2020-02-23 ENCOUNTER — Other Ambulatory Visit: Payer: Self-pay

## 2020-02-23 DIAGNOSIS — J4540 Moderate persistent asthma, uncomplicated: Secondary | ICD-10-CM | POA: Diagnosis not present

## 2020-02-23 MED ORDER — MEPOLIZUMAB 100 MG ~~LOC~~ SOLR
100.0000 mg | Freq: Once | SUBCUTANEOUS | Status: AC
  Administered 2020-02-23: 100 mg via SUBCUTANEOUS

## 2020-02-23 NOTE — Telephone Encounter (Signed)
Hi Dr. Silverio Decamp,  D.O.D  We received a referral from PCP for a repeat colonoscopy. We received her records from previous colonoscopy done in 2017. Will be sending them to you for review.  Please advise on scheduling. Thank you

## 2020-02-23 NOTE — Progress Notes (Signed)
Have you been hospitalized within the last 10 days?  No Do you have a fever?  No Do you have a cough?  No Do you have a headache or sore throat? No Do you have your Epi Pen visible and is it within date?  Yes 

## 2020-02-23 NOTE — Telephone Encounter (Signed)
Patient scheduled with Dr Lamonte Sakai, 03/29/20, at 1330.  Nothing further at this time.

## 2020-02-25 NOTE — Telephone Encounter (Signed)
Per Dr. Silverio Decamp patient is not due for a recall until 04/2023. Called and spoke with patient to advise her of recommendations. Patient stated she was told by previous GI provider that she is due every 3 yrs and will speak with her PCP.

## 2020-02-25 NOTE — Telephone Encounter (Signed)
Based on current guidelines from GI societies that is the recommendation, okay to schedule office visit to discuss it.  Thanks

## 2020-03-01 ENCOUNTER — Telehealth: Payer: Self-pay | Admitting: Emergency Medicine

## 2020-03-01 NOTE — Telephone Encounter (Signed)
Patient requested to change Nucala injection from 7/6 to 03/24/20 at 1130.  Injection appointment changed.  Nothing further at this time.

## 2020-03-02 ENCOUNTER — Telehealth: Payer: Self-pay | Admitting: Family Medicine

## 2020-03-02 NOTE — Telephone Encounter (Signed)
Please advise 

## 2020-03-02 NOTE — Telephone Encounter (Signed)
Patient needs to schedule a colonoscopy.  The place she called states she doesn't need one until 2024, however her last dr states she should have gotten one last year.  Patient is wanting to know what the dr states she needs to do.  Pt had last colonoscopy in 2017.

## 2020-03-04 NOTE — Telephone Encounter (Signed)
She can be referred to GI but she is going to need copy of last colonoscopy,pathology(if polyp removed), and former GI recommendations. Thanks, BJ

## 2020-03-07 NOTE — Telephone Encounter (Signed)
Referral was placed on 02/22/2020 to [facility]. Waiting for records from provider in [place] to be faxed to this office to make sure of date of colonoscopy.

## 2020-03-08 ENCOUNTER — Encounter: Payer: Self-pay | Admitting: Family Medicine

## 2020-03-08 NOTE — Telephone Encounter (Signed)
Results received from [facility]. Patients chart updated.

## 2020-03-08 NOTE — Telephone Encounter (Signed)
Per patient, next colonoscopy is due in 2022. Previous doctor will fax over records this week.

## 2020-03-14 ENCOUNTER — Telehealth: Payer: Self-pay | Admitting: Emergency Medicine

## 2020-03-14 NOTE — Telephone Encounter (Signed)
Nucala Order: °100mg #1 Vial °Order Date: 03/14/2020 °Expected date of arrival: 03/15/2020 °Ordered by: Zaelynn Fuchs, CMA  °Specialty Pharmacy: [facility]  °

## 2020-03-15 NOTE — Telephone Encounter (Signed)
Nucala Shipment Received: 100mg #1 vial Medication arrival date: 03/15/2020 Lot #: [id number] Exp date: 06/2023 Received by: Thorvald Orsino, CMA   

## 2020-03-18 ENCOUNTER — Telehealth: Payer: Self-pay | Admitting: Emergency Medicine

## 2020-03-18 ENCOUNTER — Other Ambulatory Visit: Payer: Self-pay | Admitting: Emergency Medicine

## 2020-03-18 NOTE — Telephone Encounter (Signed)
Refill for Ana Hughes has already been sent today.

## 2020-03-22 ENCOUNTER — Ambulatory Visit: Payer: Medicare Other

## 2020-03-24 ENCOUNTER — Ambulatory Visit (INDEPENDENT_AMBULATORY_CARE_PROVIDER_SITE_OTHER): Payer: Medicare Other

## 2020-03-24 ENCOUNTER — Other Ambulatory Visit: Payer: Self-pay

## 2020-03-24 DIAGNOSIS — J4540 Moderate persistent asthma, uncomplicated: Secondary | ICD-10-CM

## 2020-03-24 MED ORDER — MEPOLIZUMAB 100 MG ~~LOC~~ SOLR
100.0000 mg | Freq: Once | SUBCUTANEOUS | Status: AC
  Administered 2020-03-24: 100 mg via SUBCUTANEOUS

## 2020-03-24 NOTE — Progress Notes (Signed)
Have you been hospitalized within the last 10 days?  No Do you have a fever?  No Do you have a cough?  No Do you have a headache or sore throat? No Do you have your Epi Pen visible and is it within date?  Yes 

## 2020-03-26 ENCOUNTER — Other Ambulatory Visit: Payer: Self-pay | Admitting: Family Medicine

## 2020-03-29 ENCOUNTER — Ambulatory Visit: Payer: Medicare Other | Admitting: Emergency Medicine

## 2020-04-05 DIAGNOSIS — M9902 Segmental and somatic dysfunction of thoracic region: Secondary | ICD-10-CM | POA: Diagnosis not present

## 2020-04-05 DIAGNOSIS — H401122 Primary open-angle glaucoma, left eye, moderate stage: Secondary | ICD-10-CM | POA: Diagnosis not present

## 2020-04-05 DIAGNOSIS — H25013 Cortical age-related cataract, bilateral: Secondary | ICD-10-CM | POA: Diagnosis not present

## 2020-04-05 DIAGNOSIS — M5136 Other intervertebral disc degeneration, lumbar region: Secondary | ICD-10-CM | POA: Diagnosis not present

## 2020-04-05 DIAGNOSIS — M9905 Segmental and somatic dysfunction of pelvic region: Secondary | ICD-10-CM | POA: Diagnosis not present

## 2020-04-05 DIAGNOSIS — H401111 Primary open-angle glaucoma, right eye, mild stage: Secondary | ICD-10-CM | POA: Diagnosis not present

## 2020-04-05 DIAGNOSIS — M9903 Segmental and somatic dysfunction of lumbar region: Secondary | ICD-10-CM | POA: Diagnosis not present

## 2020-04-05 DIAGNOSIS — M5137 Other intervertebral disc degeneration, lumbosacral region: Secondary | ICD-10-CM | POA: Diagnosis not present

## 2020-04-09 ENCOUNTER — Other Ambulatory Visit: Payer: Self-pay | Admitting: Primary Care

## 2020-04-11 ENCOUNTER — Other Ambulatory Visit: Payer: Self-pay | Admitting: Emergency Medicine

## 2020-04-18 ENCOUNTER — Telehealth: Payer: Self-pay | Admitting: Emergency Medicine

## 2020-04-18 NOTE — Telephone Encounter (Signed)
Nucala Order: 100mg  #1 Vial Order Date: 04/18/2020 Expected date of arrival: 04/19/2020 Ordered by: Desmond Dike, [place]  [facility]ty [facility]: [facility]

## 2020-04-19 NOTE — Telephone Encounter (Signed)
Nucala Shipment Received: 100mg  #1 vial Medication arrival date: 04/19/2020 Lot #: [id number] Exp date: 06/2023 Received by: Desmond Dike, [place]

## 2020-04-27 ENCOUNTER — Ambulatory Visit (INDEPENDENT_AMBULATORY_CARE_PROVIDER_SITE_OTHER): Payer: Medicare Other | Admitting: Emergency Medicine

## 2020-04-27 ENCOUNTER — Ambulatory Visit: Payer: Medicare Other

## 2020-04-27 ENCOUNTER — Encounter: Payer: Self-pay | Admitting: Emergency Medicine

## 2020-04-27 ENCOUNTER — Other Ambulatory Visit: Payer: Self-pay

## 2020-04-27 VITALS — BP 138/74 | HR 66 | Temp 98.3°F | Ht 62.0 in | Wt 210.0 lb

## 2020-04-27 DIAGNOSIS — J4540 Moderate persistent asthma, uncomplicated: Secondary | ICD-10-CM | POA: Diagnosis not present

## 2020-04-27 MED ORDER — MEPOLIZUMAB 100 MG ~~LOC~~ SOLR
100.0000 mg | SUBCUTANEOUS | Status: DC
  Administered 2020-04-27 – 2021-04-07 (×2): 100 mg via SUBCUTANEOUS

## 2020-04-27 NOTE — Progress Notes (Signed)
All questions were answered by the patient before medication was administered. Have you been hospitalized in the last 10 days? No Do you have a fever? No Do you have a cough? No Do you have a headache or sore  throat? No   See OV encounter for documentation and charges.

## 2020-04-27 NOTE — Assessment & Plan Note (Signed)
Doing well. Some recent labile UA sx but controlled.   Please continue Dulera 2 puffs twice a day.  Rinse and gargle after using. Keep your albuterol available to use either 2 puffs or 1 nebulizer treatment when you need it for shortness of breath, chest tightness, wheezing. Continue your Nucala treatments on your current schedule. Continue Singulair 10 mg each evening Restart your antihistamine whenever you have increase in sneezing, nasal congestion, upper airway irritation and dry cough. COVID-19 vaccine is up-to-date Follow with Dr. Lamonte Sakai in 12 months or sooner if you have any problems.

## 2020-04-27 NOTE — Patient Instructions (Addendum)
Please continue Dulera 2 puffs twice a day.  Rinse and gargle after using. Keep your albuterol available to use either 2 puffs or 1 nebulizer treatment when you need it for shortness of breath, chest tightness, wheezing. Continue your Nucala treatments on your current schedule. Continue Singulair 10 mg each evening Restart your antihistamine whenever you have increase in sneezing, nasal congestion, upper airway irritation and dry cough. COVID-19 vaccine is up-to-date Follow with Dr. Lamonte Sakai in 12 months or sooner if you have any problems.

## 2020-04-27 NOTE — Progress Notes (Signed)
Subjective:    Patient ID: Ana Hughes, female    DOB: 07-25-1951, 69 y.o.   MRN: [id number]  Asthma There is no cough, shortness of breath or wheezing. Pertinent negatives include no ear pain, fever, headaches, postnasal drip, rhinorrhea, sneezing, sore throat or trouble swallowing. Her past medical history is significant for asthma.   ROV 10/07/18 --follow-up visit today for 69 year old woman with a history of asthma she is currently managed on [place].she is doing well, less albuterol use, now back on the [facility].  No exacerbations since last time.  Flu shot up-to-date.  She had the pneumonia shot at some point prior to age 26 in [place], we will try to get the records.  She is back on lisinopril, dose is actually been decreased.  No cough or breakthrough asthma symptoms on this medication.  ROV 04/27/20 --69 year old woman with a history of moderate persistent asthma as well as upper airway irritation syndrome, allergic rhinitis.  She is on [place].  Started Singulair 06/2019. She has not flared in the last year. She has been doing fairly well.  She has had some scratchy throat and some daytime cough. She had some sneezing this am. She has albuterol, uses rarely, last time was months ago. Good functional capacity.    Review of Systems  Constitutional: Negative for fever and unexpected weight change.  HENT: Negative for congestion, dental problem, ear pain, nosebleeds, postnasal drip, rhinorrhea, sinus pressure, sneezing, sore throat and trouble swallowing.   Eyes: Negative for redness and itching.  Respiratory: Negative for cough, chest tightness, shortness of breath and wheezing.   Cardiovascular: Negative for palpitations and leg swelling.  Gastrointestinal: Negative for nausea and vomiting.  Genitourinary: Negative for dysuria.  Musculoskeletal: Negative for joint swelling.  Skin: Negative for rash.  Neurological: Negative for headaches.  Hematological: Does  not bruise/bleed easily.  Psychiatric/Behavioral: Negative for dysphoric mood. The patient is not nervous/anxious.    Past Medical History:  Diagnosis Date  . Arthritis   . Asthma   . Depression   . Glaucoma   . History of colon polyps   . Hypertension      Family History  Problem Relation Age of Onset  . Cancer Mother   . Hypertension Mother   . Cancer Father   . Depression Daughter   . Drug abuse Daughter   . Depression Daughter   . Drug abuse Daughter   . Heart attack Daughter   . Depression Daughter   . Drug abuse Daughter   . Mental illness Daughter      Social History   Socioeconomic History  . Marital status: Widowed    Spouse name: Not on file  . Number of children: 3  . Years of education: Not on file  . Highest education level: Not on file  Occupational History  . Not on file  Tobacco Use  . Smoking status: Never Smoker  . Smokeless tobacco: Never Used  Vaping Use  . Vaping Use: Never used  Substance and Sexual Activity  . Alcohol use: Yes  . Drug use: Never  . Sexual activity: Not Currently  Other Topics Concern  . Not on file  Social History Narrative  . Not on file   Social Determinants of Health   Financial Resource Strain:   . Difficulty of Paying Living Expenses:   Food Insecurity:   . Worried About Charity fundraiser in the Last Year:   . [place] in the  Last Year:   Transportation Needs:   . Film/video editor (Medical):   Marland Kitchen Lack of Transportation (Non-Medical):   Physical Activity:   . Days of Exercise per Week:   . Minutes of Exercise per Session:   Stress:   . Feeling of Stress :   Social Connections:   . Frequency of Communication with Friends and Family:   . Frequency of Social Gatherings with Friends and Family:   . Attends Religious Services:   . Active Member of Clubs or Organizations:   . Attends Archivist Meetings:   Marland Kitchen Marital Status:   Intimate Partner Violence:   . Fear of Current or  Ex-Partner:   . Emotionally Abused:   Marland Kitchen Physically Abused:   . Sexually Abused:      Allergies  Allergen Reactions  . [place] [Brinzolamide-Brimonidine]      Outpatient Medications Prior to Visit  Medication Sig Dispense Refill  . amLODipine (NORVASC) 5 MG tablet Take 5 mg by mouth daily.    . bimatoprost (LUMIGAN) 0.01 % SOLN Place 1 drop into both eyes at bedtime.    . clotrimazole (MYCELEX) 10 MG troche Take 1 tablet (10 mg total) by mouth 5 (five) times daily. 35 tablet 0  . dorzolamide (TRUSOPT) 2 % ophthalmic solution Place 1 drop into both eyes 3 (three) times daily.    . DULERA 200-5 MCG/ACT AERO USE 2 INHALATIONS TWICE A DAY (MUST HAVE OFFICE VISIT FOR FURTHER REFILLS) 39 g 0  . FLUoxetine (PROZAC) 40 MG capsule Take 1 capsule (40 mg total) by mouth daily. 90 capsule 1  . meloxicam (MOBIC) 15 MG tablet TAKE ONE TABLET BY MOUTH DAILY AS NEEDED; MAKE DOCTOR'S APPOINTMENT FOR FURTHER REFILLS 30 tablet 0  . montelukast (SINGULAIR) 10 MG tablet TAKE ONE TABLET BY MOUTH AT BEDTIME 30 tablet 3  . timolol (TIMOPTIC-XR) 0.25 % ophthalmic gel-forming Place 1 drop into the left eye daily.    . Vitamin D, Ergocalciferol, (DRISDOL) 1.25 MG (50000 UNIT) CAPS capsule TAKE ONE CAPSULE BY MOUTH ONCE WEEKLY 12 capsule 1  . albuterol (PROVENTIL HFA;VENTOLIN HFA) 108 (90 Base) MCG/ACT inhaler Inhale 2 puffs into the lungs every 4 (four) hours as needed for wheezing or shortness of breath. (Patient not taking: Reported on 04/27/2020) 1 Inhaler 5  . albuterol (PROVENTIL) (2.5 MG/3ML) 0.083% nebulizer solution Take 3 mLs (2.5 mg total) by nebulization every 6 (six) hours as needed for wheezing or shortness of breath. (Patient not taking: Reported on 04/27/2020) 360 mL 3   Facility-Administered Medications Prior to Visit  Medication Dose Route Frequency Provider Last Rate Last Admin  . Mepolizumab SOLR 100 mg  100 mg Subcutaneous Q28 days Collene Gobble, MD   100 mg at 06/22/19 1322  . Mepolizumab  SOLR 100 mg  100 mg Subcutaneous Q28 days Collene Gobble, MD   100 mg at 07/20/19 1407  . Mepolizumab SOLR 100 mg  100 mg Subcutaneous Q28 days Collene Gobble, MD   100 mg at 09/22/19 1156  . Mepolizumab SOLR 100 mg  100 mg Subcutaneous Q28 days Collene Gobble, MD   100 mg at 11/27/19 1327        Objective:   Physical Exam Vitals:   04/27/20 1609  BP: 138/74  Pulse: 66  Temp: 98.3 F (36.8 C)  SpO2: 95%  Weight: 210 lb (95.3 kg)  Height: 5\' 2"  (1.575 m)   Gen: Pleasant, overwt woman, in no distress,  normal affect  ENT:  No lesions,  mouth clear,  oropharynx clear, no postnasal drip  Neck: No JVD, no stridor  Lungs: No use of accessory muscles, clear B  Cardiovascular: RRR, heart sounds normal, no murmur or gallops, trace peripheral edema  Musculoskeletal: No deformities, no cyanosis or clubbing  Neuro: alert, non focal  Skin: Warm, no lesions or rash     Assessment & Plan:  Asthma Doing well. Some recent labile UA sx but controlled.   Please continue Dulera 2 puffs twice a day.  Rinse and gargle after using. Keep your albuterol available to use either 2 puffs or 1 nebulizer treatment when you need it for shortness of breath, chest tightness, wheezing. Continue your Nucala treatments on your current schedule. Continue Singulair 10 mg each evening Restart your antihistamine whenever you have increase in sneezing, nasal congestion, upper airway irritation and dry cough. COVID-19 vaccine is up-to-date Follow with Dr. Lamonte Sakai in 12 months or sooner if you have any problems.  Baltazar Apo, MD, PhD 04/27/2020, 4:44 PM [facility] [phone] or if no answer [phone]

## 2020-04-29 ENCOUNTER — Telehealth: Payer: Self-pay | Admitting: Emergency Medicine

## 2020-04-29 NOTE — Telephone Encounter (Signed)
Spoke with pt. She has been scheduled for 05/25/2020 at 1400. Nothing further was needed.

## 2020-05-02 ENCOUNTER — Telehealth: Payer: Self-pay | Admitting: Emergency Medicine

## 2020-05-02 MED ORDER — PREDNISONE 10 MG PO TABS: ORAL_TABLET | ORAL | 0 refills | Status: DC

## 2020-05-02 MED ORDER — ALBUTEROL SULFATE (2.5 MG/3ML) 0.083% IN NEBU: 2.5000 mg | INHALATION_SOLUTION | Freq: Four times a day (QID) | RESPIRATORY_TRACT | 3 refills | Status: DC | PRN

## 2020-05-02 MED ORDER — ALBUTEROL SULFATE HFA 108 (90 BASE) MCG/ACT IN AERS: 2.0000 | INHALATION_SPRAY | RESPIRATORY_TRACT | 5 refills | Status: DC | PRN

## 2020-05-02 NOTE — Telephone Encounter (Signed)
Last Nucala injection 8/11, still taking Dulera.

## 2020-05-02 NOTE — Telephone Encounter (Signed)
Called and spoke with pt letting her know the info stated by [facility] and she verbalized understanding. Verified preferred pharmacy and sent pred taper in for pt. While speaking with pt, pt stated she also needed to have her albuterol inhaler and neb sol refilled. Refills of both meds have been sent to pharmacy for pt. Nothing further needed.

## 2020-05-02 NOTE — Telephone Encounter (Signed)
We can likely send in prednisone taper. When was her last nucala injection? Is she still taking Dulera??

## 2020-05-02 NOTE — Telephone Encounter (Signed)
Thanks. Can you sent in prednisoen taper 40mg  x 2 days; 30mg  x 2 days; 20mg  x 2 days; 10mg  x 2 days. I not better notify office

## 2020-05-02 NOTE — Telephone Encounter (Signed)
Patient saw Ana Hughes on 8/11, now more short of breath, cough is continuous, on tessalon, using nebulizer every six hours, mild wheeze, cough is non-productive, this has been going on for 6 days. Please advise.

## 2020-05-04 ENCOUNTER — Other Ambulatory Visit: Payer: Self-pay | Admitting: Pulmonary Disease

## 2020-05-04 ENCOUNTER — Telehealth: Payer: Self-pay | Admitting: Emergency Medicine

## 2020-05-04 MED ORDER — BENZONATATE 200 MG PO CAPS: ORAL_CAPSULE | ORAL | 0 refills | Status: DC

## 2020-05-04 NOTE — Telephone Encounter (Signed)
She was seen last week by RB and was reportedly doing well. I sent in prednisone 2 days ago. Recommend completing course of prednisone. Fine to refill tessalon.  If breathing is acutely worse needs ED evaluation. Also would recommend covid testing and CXR if this is new  Cc: [facility]

## 2020-05-04 NOTE — Telephone Encounter (Signed)
Primary Pulmonologist: [facility] Last office visit and with whom: 04/27/20 with RB What do we see them for (pulmonary problems): asthma Last OV assessment/plan:  Assessment & Plan:  Asthma Doing well. Some recent labile UA sx but controlled.   Please continue Dulera 2 puffs twice a day.  Rinse and gargle after using. Keep your albuterol available to use either 2 puffs or 1 nebulizer treatment when you need it for shortness of breath, chest tightness, wheezing. Continue your Nucala treatments on your current schedule. Continue Singulair 10 mg each evening Restart your antihistamine whenever you have increase in sneezing, nasal congestion, upper airway irritation and dry cough. COVID-19 vaccine is up-to-date Follow with Dr. Lamonte Sakai in 12 months or sooner if you have any problems.  Baltazar Apo, MD, PhD 04/27/2020, 4:44 PM [facility] [phone] or if no answer [phone]   Was appointment offered to patient (explain)?  Pt wants recommendations as she was just seen for an appt   Reason for call: Called and spoke with pt who states that she still has complaints of SOB. Pt stated she felt like she thought she would begin to feel better after beginning prednisone 8/16 but states she is still SOB. Pt also states that she is coughing and states the cough is worse when she lays down at night. Pt states that her cough is not keeping her up at night due to taking a sleep aid but at times she has some trouble going to sleep due to the cough.  Pt denies any complaints of fever.  Pt states that she has been wheezing and also has had chest tightness. Pt states she has had some mucus production with her cough but is unsure of the color of the phlegm. Pt has had clear postnasal drainage.  Pt states that she still has some benzonatate left but stated that the Rx bottle indicates that it needs to be discarded after 09/2019. Pt states that she would like to have a refill of this Rx. Pt is  taking singulair as directed as well as using her Dulera inhaler bid. Pt has had to use her albuterol inhaler at least twice daily and has been using the nebulizer at least every 6 hours.  Pt wants recommendations to help with her symptoms. Beth, please advise.    Allergies  Allergen Reactions  . [place] [Brinzolamide-Brimonidine]     Immunization History  Administered Date(s) Administered  . Fluad Quad(high Dose 65+) 06/22/2019  . Influenza, High Dose Seasonal PF 07/03/2018  . PFIZER SARS-COV-2 Vaccination 10/22/2019, 11/12/2019

## 2020-05-04 NOTE — Telephone Encounter (Signed)
Called and spoke with pt letting her know the info stated by [facility]. Stated to her to finish the prednisone and that we were going to refill her benzonatate and she verbalized understanding. Pt stated that she did go to get covid tested yesterday 8/17 but stated that the results will not be back until 8/20. Stated to pt to call us once results are back so we are aware of the results and she verbalized understanding.  Routing this info to RB as an [place].

## 2020-05-05 NOTE — Telephone Encounter (Signed)
Thank you :)

## 2020-05-16 ENCOUNTER — Telehealth: Payer: Self-pay | Admitting: Emergency Medicine

## 2020-05-16 NOTE — Telephone Encounter (Signed)
Nucala Order: 100mg  #1 Vial Order Date: 05/16/20 Expected date of arrival: 05/17/20 Ordered by: [place]: [facility]

## 2020-05-18 ENCOUNTER — Other Ambulatory Visit: Payer: Medicare Other

## 2020-05-18 NOTE — Telephone Encounter (Signed)
Nucala Shipment Received: 100mg  #1 vial Medication arrival date: 05/18/20 Lot #: [id number] Exp date: 10/18/2023 Received by: Elliot Dally

## 2020-05-25 ENCOUNTER — Other Ambulatory Visit: Payer: Self-pay

## 2020-05-25 ENCOUNTER — Ambulatory Visit (INDEPENDENT_AMBULATORY_CARE_PROVIDER_SITE_OTHER): Payer: Medicare Other

## 2020-05-25 DIAGNOSIS — M9903 Segmental and somatic dysfunction of lumbar region: Secondary | ICD-10-CM | POA: Diagnosis not present

## 2020-05-25 DIAGNOSIS — M5136 Other intervertebral disc degeneration, lumbar region: Secondary | ICD-10-CM | POA: Diagnosis not present

## 2020-05-25 DIAGNOSIS — M5137 Other intervertebral disc degeneration, lumbosacral region: Secondary | ICD-10-CM | POA: Diagnosis not present

## 2020-05-25 DIAGNOSIS — M9902 Segmental and somatic dysfunction of thoracic region: Secondary | ICD-10-CM | POA: Diagnosis not present

## 2020-05-25 DIAGNOSIS — J4540 Moderate persistent asthma, uncomplicated: Secondary | ICD-10-CM

## 2020-05-25 DIAGNOSIS — M9905 Segmental and somatic dysfunction of pelvic region: Secondary | ICD-10-CM | POA: Diagnosis not present

## 2020-05-25 MED ORDER — MEPOLIZUMAB 100 MG ~~LOC~~ SOLR
100.0000 mg | Freq: Once | SUBCUTANEOUS | Status: AC
  Administered 2020-05-25: 100 mg via SUBCUTANEOUS

## 2020-05-25 NOTE — Progress Notes (Signed)
Have you been hospitalized within the last 10 days?  No Do you have a fever?  No Do you have a cough?  No Do you have a headache or sore throat? No Do you have your Epi Pen visible and is it within date?  Yes 

## 2020-05-31 ENCOUNTER — Other Ambulatory Visit: Payer: Self-pay | Admitting: Family Medicine

## 2020-06-10 DIAGNOSIS — H02883 Meibomian gland dysfunction of right eye, unspecified eyelid: Secondary | ICD-10-CM | POA: Diagnosis not present

## 2020-06-10 DIAGNOSIS — H02889 Meibomian gland dysfunction of unspecified eye, unspecified eyelid: Secondary | ICD-10-CM | POA: Diagnosis not present

## 2020-06-10 DIAGNOSIS — H16229 Keratoconjunctivitis sicca, not specified as Sjogren's, unspecified eye: Secondary | ICD-10-CM | POA: Diagnosis not present

## 2020-06-10 DIAGNOSIS — H0102A Squamous blepharitis right eye, upper and lower eyelids: Secondary | ICD-10-CM | POA: Diagnosis not present

## 2020-06-13 ENCOUNTER — Telehealth: Payer: Self-pay | Admitting: Emergency Medicine

## 2020-06-13 NOTE — Telephone Encounter (Signed)
Nucala Order: 100mg  #1 Vial Order Date: 06/13/20 Expected date of arrival: 06/15/20 Ordered by: [place]: [facility]

## 2020-06-16 NOTE — Telephone Encounter (Signed)
Nucala Shipment Received: 100mg  #1 vial Medication arrival date: 06/16/20 Lot #: [id number] Exp date: 10/18/2023 Received by: Elliot Dally

## 2020-06-22 ENCOUNTER — Other Ambulatory Visit: Payer: Self-pay

## 2020-06-22 ENCOUNTER — Ambulatory Visit (INDEPENDENT_AMBULATORY_CARE_PROVIDER_SITE_OTHER): Payer: Medicare Other

## 2020-06-22 DIAGNOSIS — M5136 Other intervertebral disc degeneration, lumbar region: Secondary | ICD-10-CM | POA: Diagnosis not present

## 2020-06-22 DIAGNOSIS — J4540 Moderate persistent asthma, uncomplicated: Secondary | ICD-10-CM | POA: Diagnosis not present

## 2020-06-22 DIAGNOSIS — M9905 Segmental and somatic dysfunction of pelvic region: Secondary | ICD-10-CM | POA: Diagnosis not present

## 2020-06-22 DIAGNOSIS — M5137 Other intervertebral disc degeneration, lumbosacral region: Secondary | ICD-10-CM | POA: Diagnosis not present

## 2020-06-22 DIAGNOSIS — M9903 Segmental and somatic dysfunction of lumbar region: Secondary | ICD-10-CM | POA: Diagnosis not present

## 2020-06-22 DIAGNOSIS — M9902 Segmental and somatic dysfunction of thoracic region: Secondary | ICD-10-CM | POA: Diagnosis not present

## 2020-06-22 MED ORDER — MEPOLIZUMAB 100 MG ~~LOC~~ SOLR
100.0000 mg | Freq: Once | SUBCUTANEOUS | Status: AC
  Administered 2020-06-22: 100 mg via SUBCUTANEOUS

## 2020-06-22 NOTE — Progress Notes (Signed)
Have you been hospitalized within the last 10 days?  No Do you have a fever?  No Do you have a cough?  No Do you have a headache or sore throat? No Do you have your Epi Pen visible and is it within date?  Yes 

## 2020-06-29 DIAGNOSIS — M25571 Pain in right ankle and joints of right foot: Secondary | ICD-10-CM | POA: Diagnosis not present

## 2020-07-11 ENCOUNTER — Telehealth: Payer: Self-pay | Admitting: Emergency Medicine

## 2020-07-11 NOTE — Telephone Encounter (Signed)
Nucala Order: 100mg  #1 Vial Order Date: 07/11/20 Expected date of arrival: 07/12/20 Ordered by: [place]: [facility]

## 2020-07-12 NOTE — Telephone Encounter (Signed)
Nucala Shipment Received: 100mg  #1 vial Medication arrival date: 07/12/20 Lot #: [id number] Exp date: 11/15/2023 Received by: Elliot Dally

## 2020-07-13 DIAGNOSIS — M5416 Radiculopathy, lumbar region: Secondary | ICD-10-CM | POA: Diagnosis not present

## 2020-07-13 DIAGNOSIS — M25571 Pain in right ankle and joints of right foot: Secondary | ICD-10-CM | POA: Diagnosis not present

## 2020-07-19 DIAGNOSIS — M5416 Radiculopathy, lumbar region: Secondary | ICD-10-CM | POA: Diagnosis not present

## 2020-07-19 DIAGNOSIS — M25571 Pain in right ankle and joints of right foot: Secondary | ICD-10-CM | POA: Diagnosis not present

## 2020-07-20 ENCOUNTER — Ambulatory Visit: Payer: Medicare Other

## 2020-07-20 DIAGNOSIS — M9905 Segmental and somatic dysfunction of pelvic region: Secondary | ICD-10-CM | POA: Diagnosis not present

## 2020-07-20 DIAGNOSIS — M9902 Segmental and somatic dysfunction of thoracic region: Secondary | ICD-10-CM | POA: Diagnosis not present

## 2020-07-20 DIAGNOSIS — M9903 Segmental and somatic dysfunction of lumbar region: Secondary | ICD-10-CM | POA: Diagnosis not present

## 2020-07-20 DIAGNOSIS — M5136 Other intervertebral disc degeneration, lumbar region: Secondary | ICD-10-CM | POA: Diagnosis not present

## 2020-07-20 DIAGNOSIS — M5137 Other intervertebral disc degeneration, lumbosacral region: Secondary | ICD-10-CM | POA: Diagnosis not present

## 2020-07-21 DIAGNOSIS — M25571 Pain in right ankle and joints of right foot: Secondary | ICD-10-CM | POA: Diagnosis not present

## 2020-07-21 DIAGNOSIS — M5416 Radiculopathy, lumbar region: Secondary | ICD-10-CM | POA: Diagnosis not present

## 2020-07-22 DIAGNOSIS — H401122 Primary open-angle glaucoma, left eye, moderate stage: Secondary | ICD-10-CM | POA: Diagnosis not present

## 2020-07-22 DIAGNOSIS — H401111 Primary open-angle glaucoma, right eye, mild stage: Secondary | ICD-10-CM | POA: Diagnosis not present

## 2020-07-25 ENCOUNTER — Telehealth: Payer: Self-pay | Admitting: Emergency Medicine

## 2020-07-25 NOTE — Telephone Encounter (Signed)
Spoke with Patient.  Patient scheduled 07/26/20 at 1445, for Nucala injection.

## 2020-07-26 ENCOUNTER — Other Ambulatory Visit: Payer: Self-pay

## 2020-07-26 ENCOUNTER — Ambulatory Visit (INDEPENDENT_AMBULATORY_CARE_PROVIDER_SITE_OTHER): Payer: Medicare Other

## 2020-07-26 DIAGNOSIS — J4540 Moderate persistent asthma, uncomplicated: Secondary | ICD-10-CM

## 2020-07-26 MED ORDER — MEPOLIZUMAB 100 MG ~~LOC~~ SOLR
100.0000 mg | Freq: Once | SUBCUTANEOUS | Status: AC
  Administered 2020-07-26: 100 mg via SUBCUTANEOUS

## 2020-07-26 NOTE — Progress Notes (Signed)
Have you been hospitalized within the last 10 days?  No Do you have a fever?  No Do you have a cough?  No Do you have a headache or sore throat? No Do you have your Epi Pen visible and is it within date?  Yes 

## 2020-07-28 DIAGNOSIS — M5416 Radiculopathy, lumbar region: Secondary | ICD-10-CM | POA: Diagnosis not present

## 2020-07-28 DIAGNOSIS — M25571 Pain in right ankle and joints of right foot: Secondary | ICD-10-CM | POA: Diagnosis not present

## 2020-08-02 DIAGNOSIS — M5416 Radiculopathy, lumbar region: Secondary | ICD-10-CM | POA: Diagnosis not present

## 2020-08-02 DIAGNOSIS — M25571 Pain in right ankle and joints of right foot: Secondary | ICD-10-CM | POA: Diagnosis not present

## 2020-08-04 DIAGNOSIS — M25571 Pain in right ankle and joints of right foot: Secondary | ICD-10-CM | POA: Diagnosis not present

## 2020-08-04 DIAGNOSIS — M5416 Radiculopathy, lumbar region: Secondary | ICD-10-CM | POA: Diagnosis not present

## 2020-08-15 ENCOUNTER — Telehealth: Payer: Self-pay | Admitting: Emergency Medicine

## 2020-08-15 NOTE — Telephone Encounter (Signed)
Nucala Order: 100mg  #1 Vial Order Date: 08/15/20 Expected date of arrival: 08/16/20 Ordered by: [place]: [facility]

## 2020-08-17 DIAGNOSIS — M25571 Pain in right ankle and joints of right foot: Secondary | ICD-10-CM | POA: Diagnosis not present

## 2020-08-17 DIAGNOSIS — M5416 Radiculopathy, lumbar region: Secondary | ICD-10-CM | POA: Diagnosis not present

## 2020-08-18 ENCOUNTER — Telehealth: Payer: Self-pay | Admitting: Emergency Medicine

## 2020-08-18 NOTE — Telephone Encounter (Signed)
Nucala Shipment Received: 100mg #1 vial Medication arrival date: 08/16/20 Lot #: [id number] Exp date: 12/16/2023 Received by: Khristopher Kapaun,LPN 

## 2020-08-19 ENCOUNTER — Ambulatory Visit
Admission: RE | Admit: 2020-08-19 | Discharge: 2020-08-19 | Disposition: A | Discharge status: Home / Self Care | Payer: Medicare Other | Source: Ambulatory Visit | Attending: Family Medicine | Admitting: Family Medicine

## 2020-08-19 ENCOUNTER — Other Ambulatory Visit: Payer: Self-pay

## 2020-08-19 DIAGNOSIS — Z13820 Encounter for screening for osteoporosis: Secondary | ICD-10-CM

## 2020-08-19 DIAGNOSIS — M85832 Other specified disorders of bone density and structure, left forearm: Secondary | ICD-10-CM | POA: Diagnosis not present

## 2020-08-19 DIAGNOSIS — Z780 Asymptomatic menopausal state: Secondary | ICD-10-CM | POA: Diagnosis not present

## 2020-08-19 NOTE — Telephone Encounter (Signed)
ATC Patient.  LM to call back to reschedule Nucala appointment. Nucala is here in office.

## 2020-08-22 ENCOUNTER — Telehealth: Payer: Self-pay | Admitting: Internal Medicine

## 2020-08-22 ENCOUNTER — Telehealth: Payer: Self-pay | Admitting: Emergency Medicine

## 2020-08-22 DIAGNOSIS — M5137 Other intervertebral disc degeneration, lumbosacral region: Secondary | ICD-10-CM | POA: Diagnosis not present

## 2020-08-22 DIAGNOSIS — M9905 Segmental and somatic dysfunction of pelvic region: Secondary | ICD-10-CM | POA: Diagnosis not present

## 2020-08-22 DIAGNOSIS — M9902 Segmental and somatic dysfunction of thoracic region: Secondary | ICD-10-CM | POA: Diagnosis not present

## 2020-08-22 DIAGNOSIS — M5136 Other intervertebral disc degeneration, lumbar region: Secondary | ICD-10-CM | POA: Diagnosis not present

## 2020-08-22 DIAGNOSIS — M9903 Segmental and somatic dysfunction of lumbar region: Secondary | ICD-10-CM | POA: Diagnosis not present

## 2020-08-22 NOTE — Telephone Encounter (Addendum)
Nucala Order: 100mg #1 Vial Order Date: 08/30/20 Expected date of arrival: 08/31/20 Ordered by: Kailiana Granquist,LPN Specialty [facility]: [facility] 

## 2020-08-22 NOTE — Telephone Encounter (Signed)
Spoke with Patient.  Patient scheduled 08/25/20 at 1130.  Nothing further at this time.

## 2020-08-23 ENCOUNTER — Telehealth: Payer: Self-pay

## 2020-08-23 ENCOUNTER — Ambulatory Visit: Payer: Medicare Other

## 2020-08-23 NOTE — Telephone Encounter (Signed)
Received phone call from [facility] that pt's A1C is 9.6.  Pt hasn't been seen in over a year - needs a follow up appointment.  Can you guys help pt get scheduled with pcp? Thanks!

## 2020-08-24 NOTE — Telephone Encounter (Signed)
No answer  Sent a message through [place]

## 2020-08-24 NOTE — Telephone Encounter (Signed)
Pt called back and was scheduled.

## 2020-08-25 ENCOUNTER — Other Ambulatory Visit: Payer: Self-pay

## 2020-08-25 ENCOUNTER — Ambulatory Visit (INDEPENDENT_AMBULATORY_CARE_PROVIDER_SITE_OTHER): Payer: Medicare Other

## 2020-08-25 DIAGNOSIS — J4540 Moderate persistent asthma, uncomplicated: Secondary | ICD-10-CM | POA: Diagnosis not present

## 2020-08-25 MED ORDER — MEPOLIZUMAB 100 MG ~~LOC~~ SOLR
100.0000 mg | Freq: Once | SUBCUTANEOUS | Status: AC
  Administered 2020-08-25: 100 mg via SUBCUTANEOUS

## 2020-08-25 NOTE — Progress Notes (Signed)
Have you been hospitalized within the last 10 days?  No Do you have a fever?  No Do you have a cough?  No Do you have a headache or sore throat? No  

## 2020-08-30 DIAGNOSIS — M5416 Radiculopathy, lumbar region: Secondary | ICD-10-CM | POA: Diagnosis not present

## 2020-08-30 DIAGNOSIS — M25571 Pain in right ankle and joints of right foot: Secondary | ICD-10-CM | POA: Diagnosis not present

## 2020-08-31 ENCOUNTER — Other Ambulatory Visit: Payer: Self-pay

## 2020-08-31 ENCOUNTER — Ambulatory Visit (INDEPENDENT_AMBULATORY_CARE_PROVIDER_SITE_OTHER): Payer: Medicare Other | Admitting: Family Medicine

## 2020-08-31 ENCOUNTER — Encounter: Payer: Self-pay | Admitting: Family Medicine

## 2020-08-31 VITALS — BP 130/76 | HR 59 | Temp 98.3°F | Resp 16 | Ht 62.0 in | Wt 209.2 lb

## 2020-08-31 DIAGNOSIS — Z23 Encounter for immunization: Secondary | ICD-10-CM

## 2020-08-31 DIAGNOSIS — E1169 Type 2 diabetes mellitus with other specified complication: Secondary | ICD-10-CM | POA: Diagnosis not present

## 2020-08-31 DIAGNOSIS — E785 Hyperlipidemia, unspecified: Secondary | ICD-10-CM

## 2020-08-31 DIAGNOSIS — M5440 Lumbago with sciatica, unspecified side: Secondary | ICD-10-CM

## 2020-08-31 DIAGNOSIS — I10 Essential (primary) hypertension: Secondary | ICD-10-CM | POA: Diagnosis not present

## 2020-08-31 DIAGNOSIS — Z1159 Encounter for screening for other viral diseases: Secondary | ICD-10-CM

## 2020-08-31 DIAGNOSIS — M85839 Other specified disorders of bone density and structure, unspecified forearm: Secondary | ICD-10-CM

## 2020-08-31 DIAGNOSIS — G8929 Other chronic pain: Secondary | ICD-10-CM

## 2020-08-31 DIAGNOSIS — F324 Major depressive disorder, single episode, in partial remission: Secondary | ICD-10-CM

## 2020-08-31 MED ORDER — GABAPENTIN 300 MG PO CAPS: 300.0000 mg | ORAL_CAPSULE | Freq: Every day | ORAL | 3 refills | Status: DC | PRN

## 2020-08-31 MED ORDER — OZEMPIC (0.25 OR 0.5 MG/DOSE) 2 MG/1.5ML ~~LOC~~ SOPN: PEN_INJECTOR | SUBCUTANEOUS | 2 refills | Status: DC

## 2020-08-31 MED ORDER — ACCU-CHEK AVIVA CONNECT W/DEVICE KIT: 1.0000 | PACK | Freq: Every day | 0 refills | Status: DC

## 2020-08-31 MED ORDER — AMLODIPINE BESYLATE 5 MG PO TABS: 5.0000 mg | ORAL_TABLET | Freq: Every day | ORAL | 3 refills | Status: DC

## 2020-08-31 NOTE — Telephone Encounter (Signed)
Nucala Shipment Received: °100mg #1 vial °Medication arrival date: 08/31/20 °Lot #: [id number] °Exp date: 12/16/2023 °Received by: Reade Trefz,LPN °

## 2020-08-31 NOTE — Progress Notes (Signed)
HPI: Ana Hughes is a 69 y.o. female, who is here today for follow up.   She was last seen on 04/22/2019, virtual visit. Since her last visit she has follow with pulmonologist a few times and ophthalmologist.. Today she is concerned about recent A1c done through [facility] home nurse visit, A1c was 9.6. No history of diabetes. She has not noted abdominal pain, N/V, polydipsia, polyuria, polyphagia. She follows with her ophthalmologist regularly for glaucoma. Retinopathy has not been appreciated.  She has not been exercising regularly nor following a healthful diet consistently.  Lower back pain, bilateral. Pain is intermittent radiated to LE's. She has been on gabapentin 300 mg, which she takes as needed. Negative for saddle anesthesia, bowel/bladder dysfunction, or weakness.  Hyperlipidemia: Currently she is on nonpharmacologic treatment. 10/02/2018: TC 206, HDL 61, TG 98, and LDL 125.  Hypertension: Currently she is on amlodipine 5 mg daily. Denies severe/frequent headache, visual changes, chest pain, dyspnea, palpitation, claudication, focal weakness, or edema.  Component     Latest Ref Rng & Units 04/22/2019  Sodium     135 - 146 mmol/L 137  Potassium     3.5 - 5.3 mmol/L 3.8  Chloride     98 - 110 mmol/L 104  CO2     20 - 32 mmol/L 26  Glucose     65 - 99 mg/dL 129 (H)  BUN     7 - 25 mg/dL 21  Creatinine     0.50 - 0.99 mg/dL 0.64  Calcium     8.6 - 10.4 mg/dL 9.5  GFR     >60.00 mL/min 92.72   Osteopenia:DEXA done on 08/25/20. Hips were excluded due to hardware.  FRAX score was not calculated.  Vit D deficiency: She is on Ergocalciferol 50,000 U weekly. Followed by Dr Lamonte Sakai.  Anxiety depression: Currently she is on fluoxetine 40 mg daily, she has taken medication for many years.  Review of Systems  Constitutional: Negative for activity change, appetite change, fatigue and fever.  HENT: Negative for mouth sores, nosebleeds and sore throat.    Respiratory: Negative for cough and wheezing.   Gastrointestinal:       Negative for changes in bowel habits.  Genitourinary: Negative for decreased urine volume, dysuria and hematuria.  Allergic/Immunologic: Positive for environmental allergies.  Neurological: Negative for syncope, facial asymmetry and weakness.  Psychiatric/Behavioral: Negative for confusion.  Rest of ROS, see pertinent positives sand negatives in HPI  Current Outpatient Medications on File Prior to Visit  Medication Sig Dispense Refill  . albuterol (PROVENTIL) (2.5 MG/3ML) 0.083% nebulizer solution Take 3 mLs (2.5 mg total) by nebulization every 6 (six) hours as needed for wheezing or shortness of breath. 360 mL 3  . albuterol (VENTOLIN HFA) 108 (90 Base) MCG/ACT inhaler Inhale 2 puffs into the lungs every 4 (four) hours as needed for wheezing or shortness of breath. 8 g 5  . benzonatate (TESSALON) 200 MG capsule TAKE ONE CAPSULE BY MOUTH EVERY 8 HOURS AS NEEDED FOR COUGH 60 capsule 0  . bimatoprost (LUMIGAN) 0.01 % SOLN Place 1 drop into both eyes at bedtime.    . clotrimazole (MYCELEX) 10 MG troche Take 1 tablet (10 mg total) by mouth 5 (five) times daily. 35 tablet 0  . dorzolamide (TRUSOPT) 2 % ophthalmic solution Place 1 drop into both eyes 3 (three) times daily.    . DULERA 200-5 MCG/ACT AERO USE 2 INHALATIONS TWICE A DAY (MUST HAVE OFFICE VISIT FOR FURTHER REFILLS) 39  g 0  . FLUoxetine (PROZAC) 40 MG capsule Take 1 capsule (40 mg total) by mouth daily. 90 capsule 1  . meloxicam (MOBIC) 15 MG tablet TAKE ONE TABLET BY MOUTH DAILY AS NEEDED; MAKE DOCTOR'S APPOINTMENT FOR FURTHER REFILLS 30 tablet 0  . montelukast (SINGULAIR) 10 MG tablet TAKE ONE TABLET BY MOUTH AT BEDTIME 30 tablet 3  . timolol (TIMOPTIC-XR) 0.25 % ophthalmic gel-forming Place 1 drop into the left eye daily.    . Vitamin D, Ergocalciferol, (DRISDOL) 1.25 MG (50000 UNIT) CAPS capsule TAKE ONE CAPSULE BY MOUTH ONCE WEEKLY 12 capsule 1   Current  Facility-Administered Medications on File Prior to Visit  Medication Dose Route Frequency Provider Last Rate Last Admin  . Mepolizumab SOLR 100 mg  100 mg Subcutaneous Q28 days Collene Gobble, MD   100 mg at 06/22/19 1322  . Mepolizumab SOLR 100 mg  100 mg Subcutaneous Q28 days Collene Gobble, MD   100 mg at 07/20/19 1407  . Mepolizumab SOLR 100 mg  100 mg Subcutaneous Q28 days Collene Gobble, MD   100 mg at 09/22/19 1156  . Mepolizumab SOLR 100 mg  100 mg Subcutaneous Q28 days Collene Gobble, MD   100 mg at 11/27/19 1327  . Mepolizumab SOLR 100 mg  100 mg Subcutaneous Q28 days Collene Gobble, MD   100 mg at 04/27/20 1613    Past Medical History:  Diagnosis Date  . Arthritis   . Asthma   . Depression   . Glaucoma   . History of colon polyps   . Hypertension    Allergies  Allergen Reactions  . Gruetli-Laager [Brinzolamide-Brimonidine]     Social History   Socioeconomic History  . Marital status: Widowed    Spouse name: Not on file  . Number of children: 3  . Years of education: Not on file  . Highest education level: Not on file  Occupational History  . Not on file  Tobacco Use  . Smoking status: Never Smoker  . Smokeless tobacco: Never Used  Vaping Use  . Vaping Use: Never used  Substance and Sexual Activity  . Alcohol use: Yes  . Drug use: Never  . Sexual activity: Not Currently  Other Topics Concern  . Not on file  Social History Narrative  . Not on file   Social Determinants of Health   Financial Resource Strain: Not on file  Food Insecurity: Not on file  Transportation Needs: Not on file  Physical Activity: Not on file  Stress: Not on file  Social Connections: Not on file    Vitals:   08/31/20 1139  BP: 130/76  Pulse: (!) 59  Resp: 16  Temp: 98.3 F (36.8 C)  SpO2: 96%   Wt Readings from Last 3 Encounters:  08/31/20 209 lb 3.2 oz (94.9 kg)  04/27/20 210 lb (95.3 kg)  06/22/19 206 lb 6.4 oz (93.6 kg)   Body mass index is 38.26  kg/m.  Physical Exam Vitals and nursing note reviewed.  Constitutional:      General: She is not in acute distress.    Appearance: She is well-developed.  HENT:     Head: Normocephalic and atraumatic.     Mouth/Throat:     Mouth: Oropharynx is clear and moist and mucous membranes are normal. Mucous membranes are moist.     Pharynx: Oropharynx is clear.  Eyes:     Conjunctiva/sclera: Conjunctivae normal.     Pupils: Pupils are equal, round, and reactive  to light.  Cardiovascular:     Rate and Rhythm: Regular rhythm. Bradycardia present.     Pulses:          Dorsalis pedis pulses are 2+ on the right side and 2+ on the left side.     Heart sounds: No murmur heard.   Pulmonary:     Effort: Pulmonary effort is normal. No respiratory distress.     Breath sounds: Normal breath sounds.  Abdominal:     Palpations: Abdomen is soft. There is no hepatomegaly or mass.     Tenderness: There is no abdominal tenderness.  Musculoskeletal:        General: No edema.  Lymphadenopathy:     Cervical: No cervical adenopathy.  Skin:    General: Skin is warm.     Findings: No erythema or rash.  Neurological:     Mental Status: She is alert and oriented to person, place, and time.     Cranial Nerves: No cranial nerve deficit.     Deep Tendon Reflexes: Strength normal.     Comments: Antalgic gait, not assisted.   Psychiatric:        Mood and Affect: Mood and affect normal.     Comments: Well groomed, good eye contact.   ASSESSMENT AND PLAN:  Ana Hughes was seen today for follow-up.  Orders Placed This Encounter  Procedures  . Flu Vaccine QUAD High Dose(Fluad)  . BASIC METABOLIC PANEL WITH GFR  . Fructosamine  . Hemoglobin A1c  . Lipid panel  . Microalbumin / creatinine urine ratio  . Hepatitis C antibody  . Amb Referral to Nutrition and Diabetic E    Lab Results  Component Value Date   HGBA1C 5.4 08/31/2020   Lab Results  Component Value Date   CREATININE 0.55  08/31/2020   BUN 15 08/31/2020   NA 137 08/31/2020   K 4.5 08/31/2020   CL 102 08/31/2020   CO2 25 08/31/2020   Lab Results  Component Value Date   MICROALBUR 0.7 08/31/2020   Type 2 diabetes mellitus with other specified complication, without long-term current use of insulin ([place]) We discussed diagnosis, prognosis, and treatment options. She agrees with starting Ozempic, will start with low dose and titrate up to 1 mg weekly as tolerated.  -     Amb Referral to Nutrition and Diabetic E -     Semaglutide,0.25 or 0.5MG/DOS, (OZEMPIC, 0.25 OR 0.5 MG/DOSE,) 2 MG/1.5ML SOPN; Start with 0.25 mg weekly and titrate dose up as instructed.  Encounter for HCV screening test for low risk patient -     Hepatitis C antibody  Hypertension, essential, benign BP is adequately controlled. Continue amlodipine 5 mg daily. Low-salt diet. Eye exam is current.  Hyperlipidemia, unspecified hyperlipidemia type Continue nonpharmacologic treatment. We discussed some benefits of statin medications. Further recommendation will be given according to lipid panel results.  Major depressive disorder in partial remission, unspecified whether recurrent ([place]) Problem is well controlled. Continue fluoxetine 40 mg daily.  Chronic bilateral low back pain with sciatica, sciatica laterality unspecified Problem has been stable. Continue gabapentin 300 mg daily as needed. Weight loss will also help.  -     gabapentin (NEURONTIN) 300 MG capsule; Take 1 capsule (300 mg total) by mouth daily as needed.  Osteopenia of forearm, unspecified laterality Review DEXA results. Calcium and vitamin D supplementation to continue. For prevention. Low impact regular physical activity. Repeat DEXA in 2 to 3 years.  Need for influenza vaccination -  Flu Vaccine QUAD High Dose(Fluad)  Morbid obesity ([place]) She understands benefits of wt loss as well as adverse effects of obesity. Consistency with healthy diet and  physical activity recommended. Given recent elevated A1c, I think he would benefit from Ozempic.  -     Semaglutide,0.25 or 0.5MG/DOS, (OZEMPIC, 0.25 OR 0.5 MG/DOSE,) 2 MG/1.5ML SOPN; Start with 0.25 mg weekly and titrate dose up as instructed.  Other orders -     Blood Glucose Monitoring Suppl (ACCU-CHEK AVIVA CONNECT) w/Device KIT; 1 Device by Does not apply route daily.   Spent 42 minutes with pt.  During this time history was obtained and documented, examination was performed, prior labs/imaging reviewed, and assessment/plan discussed.   Return in about 4 months (around 12/30/2020).    Arti Trang G. [place], MD  [facility]. [place] office.    A few things to remember from today's visit:   Encounter for HCV screening test for low risk patient - Plan: Hepatitis C antibody  Hypertension, essential, benign - Plan: BASIC METABOLIC PANEL WITH GFR  Hyperlipidemia, unspecified hyperlipidemia type - Plan: Lipid panel  Major depressive disorder in partial remission, unspecified whether recurrent ([place])  Type 2 diabetes mellitus with other specified complication, without long-term current use of insulin ([place]) - Plan: Fructosamine, Hemoglobin A1c, Microalbumin / creatinine urine ratio, Amb Referral to Nutrition and Diabetic E, Semaglutide,0.25 or 0.5MG/DOS, (OZEMPIC, 0.25 OR 0.5 MG/DOSE,) 2 MG/1.5ML SOPN  Chronic bilateral low back pain with sciatica, sciatica laterality unspecified - Plan: gabapentin (NEURONTIN) 300 MG capsule  Osteopenia of forearm, unspecified laterality  If you need refills please call your pharmacy. Do not use My Chart to request refills or for acute issues that need immediate attention.   Ozempic started today. Start 0.25 mg and increase dose to 0.5 mg and 1 mg every other week as tolerated.  Diabetes Mellitus and Nutrition, Adult When you have diabetes (diabetes mellitus), it is very important to have healthy eating habits because your blood sugar  (glucose) levels are greatly affected by what you eat and drink. Eating healthy foods in the appropriate amounts, at about the same times every day, can help you:  Control your blood glucose.  Lower your risk of heart disease.  Improve your blood pressure.  Reach or maintain a healthy weight. Every person with diabetes is different, and each person has different needs for a meal plan. Your health care provider may recommend that you work with a diet and nutrition specialist (dietitian) to make a meal plan that is best for you. Your meal plan may vary depending on factors such as:  The calories you need.  The medicines you take.  Your weight.  Your blood glucose, blood pressure, and cholesterol levels.  Your activity level.  Other health conditions you have, such as heart or kidney disease. How do carbohydrates affect me? Carbohydrates, also called carbs, affect your blood glucose level more than any other type of food. Eating carbs naturally raises the amount of glucose in your blood. Carb counting is a method for keeping track of how many carbs you eat. Counting carbs is important to keep your blood glucose at a healthy level, especially if you use insulin or take certain oral diabetes medicines. It is important to know how many carbs you can safely have in each meal. This is different for every person. Your dietitian can help you calculate how many carbs you should have at each meal and for each snack. Foods that contain carbs include:  Bread,  cereal, rice, pasta, and crackers.  Potatoes and corn.  Peas, beans, and lentils.  Milk and yogurt.  Fruit and juice.  Desserts, such as cakes, cookies, ice cream, and candy. How does alcohol affect me? Alcohol can cause a sudden decrease in blood glucose (hypoglycemia), especially if you use insulin or take certain oral diabetes medicines. Hypoglycemia can be a life-threatening condition. Symptoms of hypoglycemia (sleepiness,  dizziness, and confusion) are similar to symptoms of having too much alcohol. If your health care provider says that alcohol is safe for you, follow these guidelines:  Limit alcohol intake to no more than 1 drink per day for nonpregnant women and 2 drinks per day for men. One drink equals 12 oz of beer, 5 oz of wine, or 1 oz of hard liquor.  Do not drink on an empty stomach.  Keep yourself hydrated with water, diet soda, or unsweetened iced tea.  Keep in mind that regular soda, juice, and other mixers may contain a lot of sugar and must be counted as carbs. What are tips for following this plan?  Reading food labels  Start by checking the serving size on the "Nutrition Facts" label of packaged foods and drinks. The amount of calories, carbs, fats, and other nutrients listed on the label is based on one serving of the item. Many items contain more than one serving per package.  Check the total grams (g) of carbs in one serving. You can calculate the number of servings of carbs in one serving by dividing the total carbs by 15. For example, if a food has 30 g of total carbs, it would be equal to 2 servings of carbs.  Check the number of grams (g) of saturated and trans fats in one serving. Choose foods that have low or no amount of these fats.  Check the number of milligrams (mg) of salt (sodium) in one serving. Most people should limit total sodium intake to less than 2,300 mg per day.  Always check the nutrition information of foods labeled as "low-fat" or "nonfat". These foods may be higher in added sugar or refined carbs and should be avoided.  Talk to your dietitian to identify your daily goals for nutrients listed on the label. Shopping  Avoid buying canned, premade, or processed foods. These foods tend to be high in fat, sodium, and added sugar.  Shop around the outside edge of the grocery store. This includes fresh fruits and vegetables, bulk grains, fresh meats, and fresh  dairy. Cooking  Use low-heat cooking methods, such as baking, instead of high-heat cooking methods like deep frying.  Cook using healthy oils, such as olive, canola, or sunflower oil.  Avoid cooking with butter, cream, or high-fat meats. Meal planning  Eat meals and snacks regularly, preferably at the same times every day. Avoid going long periods of time without eating.  Eat foods high in fiber, such as fresh fruits, vegetables, beans, and whole grains. Talk to your dietitian about how many servings of carbs you can eat at each meal.  Eat 4-6 ounces (oz) of lean protein each day, such as lean meat, chicken, fish, eggs, or tofu. One oz of lean protein is equal to: ? 1 oz of meat, chicken, or fish. ? 1 egg. ?  cup of tofu.  Eat some foods each day that contain healthy fats, such as avocado, nuts, seeds, and fish. Lifestyle  Check your blood glucose regularly.  Exercise regularly as told by your health care provider. This may include: ?  150 minutes of moderate-intensity or vigorous-intensity exercise each week. This could be brisk walking, biking, or water aerobics. ? Stretching and doing strength exercises, such as yoga or weightlifting, at least 2 times a week.  Take medicines as told by your health care provider.  Do not use any products that contain nicotine or tobacco, such as cigarettes and e-cigarettes. If you need help quitting, ask your health care provider.  Work with a Social worker or diabetes educator to identify strategies to manage stress and any emotional and social challenges. Questions to ask a health care provider  Do I need to meet with a diabetes educator?  Do I need to meet with a dietitian?  What number can I call if I have questions?  When are the best times to check my blood glucose? Where to find more information:  American Diabetes Association: diabetes.org  Academy of Nutrition and Dietetics: www.eatright.[employer] of Diabetes and  Digestive and Kidney Diseases (NIH): [employer] Summary  A healthy meal plan will help you control your blood glucose and maintain a healthy lifestyle.  Working with a diet and nutrition specialist (dietitian) can help you make a meal plan that is best for you.  Keep in mind that carbohydrates (carbs) and alcohol have immediate effects on your blood glucose levels. It is important to count carbs and to use alcohol carefully. This information is not intended to replace advice given to you by your health care provider. Make sure you discuss any questions you have with your health care provider. Document Revised: 08/16/2017 Document Reviewed: 10/08/2016 Elsevier Patient Education  [place].  Please be sure medication list is accurate. If a new problem present, please set up appointment sooner than planned today.

## 2020-08-31 NOTE — Patient Instructions (Addendum)
A few things to remember from today's visit:   Encounter for HCV screening test for low risk patient - Plan: Hepatitis C antibody  Hypertension, essential, benign - Plan: BASIC METABOLIC PANEL WITH GFR  Hyperlipidemia, unspecified hyperlipidemia type - Plan: Lipid panel  Major depressive disorder in partial remission, unspecified whether recurrent ([place])  Type 2 diabetes mellitus with other specified complication, without long-term current use of insulin ([place]) - Plan: Fructosamine, Hemoglobin A1c, Microalbumin / creatinine urine ratio, Amb Referral to Nutrition and Diabetic E, Semaglutide,0.25 or 0.5MG /DOS, (OZEMPIC, 0.25 OR 0.5 MG/DOSE,) 2 MG/1.5ML SOPN  Chronic bilateral low back pain with sciatica, sciatica laterality unspecified - Plan: gabapentin (NEURONTIN) 300 MG capsule  Osteopenia of forearm, unspecified laterality  If you need refills please call your pharmacy. Do not use My Chart to request refills or for acute issues that need immediate attention.   Ozempic started today. Start 0.25 mg and increase dose to 0.5 mg and 1 mg every other week as tolerated.  Diabetes Mellitus and Nutrition, Adult When you have diabetes (diabetes mellitus), it is very important to have healthy eating habits because your blood sugar (glucose) levels are greatly affected by what you eat and drink. Eating healthy foods in the appropriate amounts, at about the same times every day, can help you:  Control your blood glucose.  Lower your risk of heart disease.  Improve your blood pressure.  Reach or maintain a healthy weight. Every person with diabetes is different, and each person has different needs for a meal plan. Your health care provider may recommend that you work with a diet and nutrition specialist (dietitian) to make a meal plan that is best for you. Your meal plan may vary depending on factors such as:  The calories you need.  The medicines you take.  Your weight.  Your blood  glucose, blood pressure, and cholesterol levels.  Your activity level.  Other health conditions you have, such as heart or kidney disease. How do carbohydrates affect me? Carbohydrates, also called carbs, affect your blood glucose level more than any other type of food. Eating carbs naturally raises the amount of glucose in your blood. Carb counting is a method for keeping track of how many carbs you eat. Counting carbs is important to keep your blood glucose at a healthy level, especially if you use insulin or take certain oral diabetes medicines. It is important to know how many carbs you can safely have in each meal. This is different for every person. Your dietitian can help you calculate how many carbs you should have at each meal and for each snack. Foods that contain carbs include:  Bread, cereal, rice, pasta, and crackers.  Potatoes and corn.  Peas, beans, and lentils.  Milk and yogurt.  Fruit and juice.  Desserts, such as cakes, cookies, ice cream, and candy. How does alcohol affect me? Alcohol can cause a sudden decrease in blood glucose (hypoglycemia), especially if you use insulin or take certain oral diabetes medicines. Hypoglycemia can be a life-threatening condition. Symptoms of hypoglycemia (sleepiness, dizziness, and confusion) are similar to symptoms of having too much alcohol. If your health care provider says that alcohol is safe for you, follow these guidelines:  Limit alcohol intake to no more than 1 drink per day for nonpregnant women and 2 drinks per day for men. One drink equals 12 oz of beer, 5 oz of wine, or 1 oz of hard liquor.  Do not drink on an empty stomach.  Keep yourself  hydrated with water, diet soda, or unsweetened iced tea.  Keep in mind that regular soda, juice, and other mixers may contain a lot of sugar and must be counted as carbs. What are tips for following this plan?  Reading food labels  Start by checking the serving size on the  "Nutrition Facts" label of packaged foods and drinks. The amount of calories, carbs, fats, and other nutrients listed on the label is based on one serving of the item. Many items contain more than one serving per package.  Check the total grams (g) of carbs in one serving. You can calculate the number of servings of carbs in one serving by dividing the total carbs by 15. For example, if a food has 30 g of total carbs, it would be equal to 2 servings of carbs.  Check the number of grams (g) of saturated and trans fats in one serving. Choose foods that have low or no amount of these fats.  Check the number of milligrams (mg) of salt (sodium) in one serving. Most people should limit total sodium intake to less than 2,300 mg per day.  Always check the nutrition information of foods labeled as "low-fat" or "nonfat". These foods may be higher in added sugar or refined carbs and should be avoided.  Talk to your dietitian to identify your daily goals for nutrients listed on the label. Shopping  Avoid buying canned, premade, or processed foods. These foods tend to be high in fat, sodium, and added sugar.  Shop around the outside edge of the grocery store. This includes fresh fruits and vegetables, bulk grains, fresh meats, and fresh dairy. Cooking  Use low-heat cooking methods, such as baking, instead of high-heat cooking methods like deep frying.  Cook using healthy oils, such as olive, canola, or sunflower oil.  Avoid cooking with butter, cream, or high-fat meats. Meal planning  Eat meals and snacks regularly, preferably at the same times every day. Avoid going long periods of time without eating.  Eat foods high in fiber, such as fresh fruits, vegetables, beans, and whole grains. Talk to your dietitian about how many servings of carbs you can eat at each meal.  Eat 4-6 ounces (oz) of lean protein each day, such as lean meat, chicken, fish, eggs, or tofu. One oz of lean protein is equal  to: ? 1 oz of meat, chicken, or fish. ? 1 egg. ?  cup of tofu.  Eat some foods each day that contain healthy fats, such as avocado, nuts, seeds, and fish. Lifestyle  Check your blood glucose regularly.  Exercise regularly as told by your health care provider. This may include: ? 150 minutes of moderate-intensity or vigorous-intensity exercise each week. This could be brisk walking, biking, or water aerobics. ? Stretching and doing strength exercises, such as yoga or weightlifting, at least 2 times a week.  Take medicines as told by your health care provider.  Do not use any products that contain nicotine or tobacco, such as cigarettes and e-cigarettes. If you need help quitting, ask your health care provider.  Work with a Social worker or diabetes educator to identify strategies to manage stress and any emotional and social challenges. Questions to ask a health care provider  Do I need to meet with a diabetes educator?  Do I need to meet with a dietitian?  What number can I call if I have questions?  When are the best times to check my blood glucose? Where to find more information:  American Diabetes Association: diabetes.org  Academy of Nutrition and Dietetics: www.eatright.[employer] of Diabetes and Digestive and Kidney Diseases (NIH): [employer] Summary  A healthy meal plan will help you control your blood glucose and maintain a healthy lifestyle.  Working with a diet and nutrition specialist (dietitian) can help you make a meal plan that is best for you.  Keep in mind that carbohydrates (carbs) and alcohol have immediate effects on your blood glucose levels. It is important to count carbs and to use alcohol carefully. This information is not intended to replace advice given to you by your health care provider. Make sure you discuss any questions you have with your health care provider. Document Revised: 08/16/2017 Document Reviewed: 10/08/2016 Elsevier  Patient Education  [place].  Please be sure medication list is accurate. If a new problem present, please set up appointment sooner than planned today.

## 2020-09-01 ENCOUNTER — Encounter: Payer: Self-pay | Admitting: Family Medicine

## 2020-09-01 LAB — MICROALBUMIN / CREATININE URINE RATIO
Creatinine, Urine: 73 mg/dL (ref 20–275)
Microalb Creat Ratio: 10 mcg/mg creat (ref <30)
Microalb, Ur: 0.7 mg/dL

## 2020-09-02 DIAGNOSIS — M5416 Radiculopathy, lumbar region: Secondary | ICD-10-CM | POA: Diagnosis not present

## 2020-09-02 DIAGNOSIS — M25571 Pain in right ankle and joints of right foot: Secondary | ICD-10-CM | POA: Diagnosis not present

## 2020-09-04 LAB — BASIC METABOLIC PANEL WITH GFR
BUN: 15 mg/dL (ref 7–25)
CO2: 25 mmol/L (ref 20–32)
Calcium: 9.1 mg/dL (ref 8.6–10.4)
Chloride: 102 mmol/L (ref 98–110)
Creat: 0.55 mg/dL (ref 0.50–0.99)
GFR, Est African American: 112 mL/min/{1.73_m2} (ref >=60)
GFR, Est Non African American: 97 mL/min/{1.73_m2} (ref >=60)
Glucose, Bld: 87 mg/dL (ref 65–99)
Potassium: 4.5 mmol/L (ref 3.5–5.3)
Sodium: 137 mmol/L (ref 135–146)

## 2020-09-04 LAB — LIPID PANEL
Cholesterol: 264 mg/dL — ABNORMAL HIGH (ref <200)
HDL: 71 mg/dL (ref >=50)
LDL Cholesterol (Calc): 174 mg/dL (calc) — ABNORMAL HIGH
Non-HDL Cholesterol (Calc): 193 mg/dL (calc) — ABNORMAL HIGH (ref <130)
Total CHOL/HDL Ratio: 3.7 (calc) (ref <5.0)
Triglycerides: 83 mg/dL (ref <150)

## 2020-09-04 LAB — HEMOGLOBIN A1C
Hgb A1c MFr Bld: 5.4 % of total Hgb (ref <5.7)
Mean Plasma Glucose: 108 mg/dL
eAG (mmol/L): 6 mmol/L

## 2020-09-04 LAB — HEPATITIS C ANTIBODY
Hepatitis C Ab: NONREACTIVE
SIGNAL TO CUT-OFF: 0.01 (ref <1.00)

## 2020-09-04 LAB — FRUCTOSAMINE: Fructosamine: 221 umol/L (ref 205–285)

## 2020-09-05 IMAGING — MG DIGITAL SCREENING BILAT W/ TOMO W/ CAD
8 series · 8 of 24 positions shown · non-contrast
Comparison: Previous exam(s).

CLINICAL DATA: Screening.

EXAM:
DIGITAL SCREENING BILATERAL MAMMOGRAM WITH TOMO AND CAD

[L CC synth-2D]
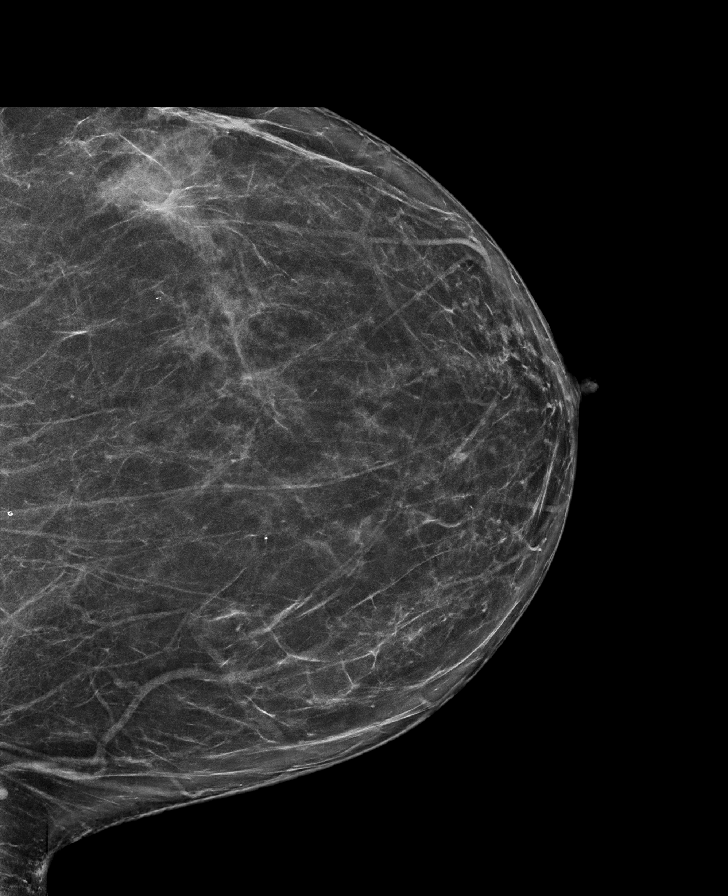

[L MLO synth-2D]
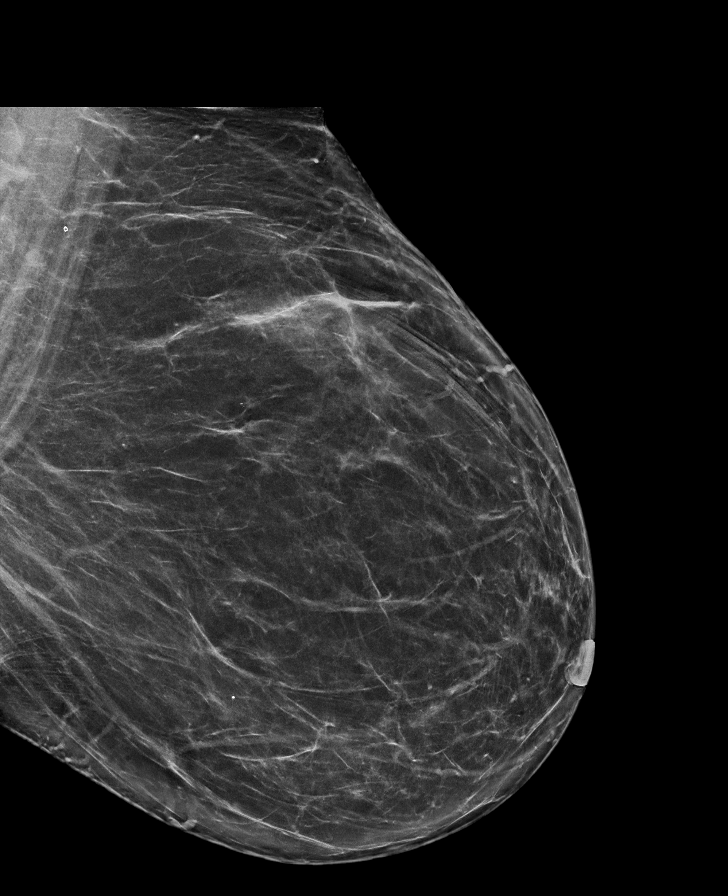

[R MLO synth-2D]
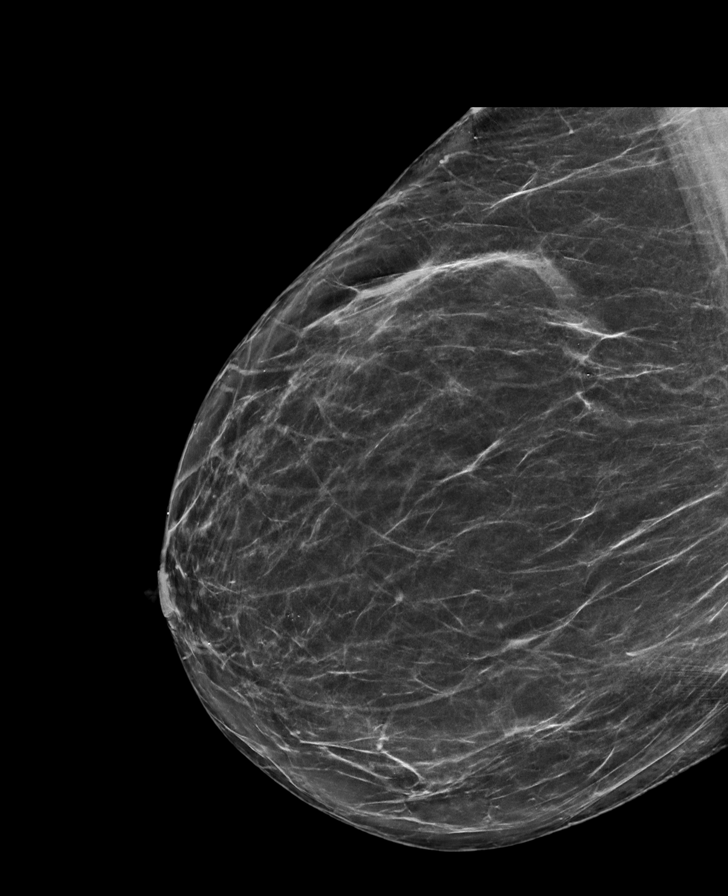

[R CC synth-2D]
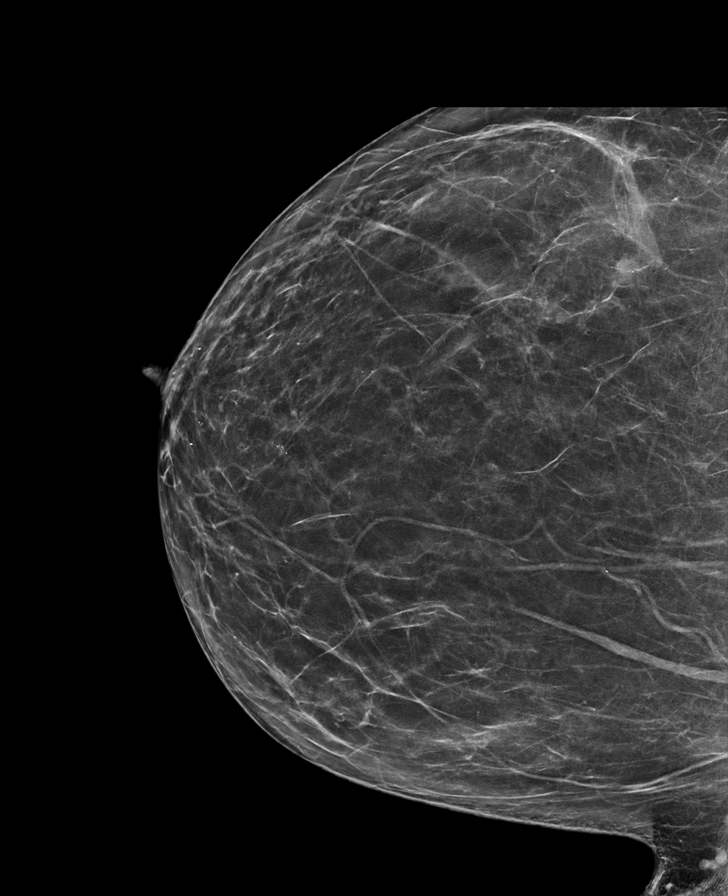

[L MLO tomo · tomo slice 45/89.0]
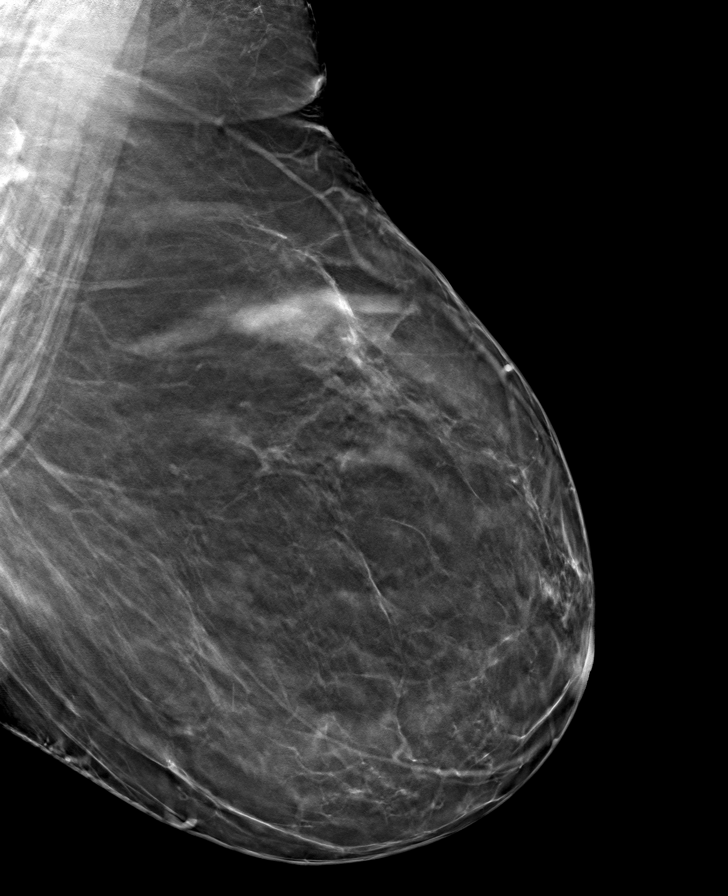

[L CC tomo · tomo slice 39/78.0]
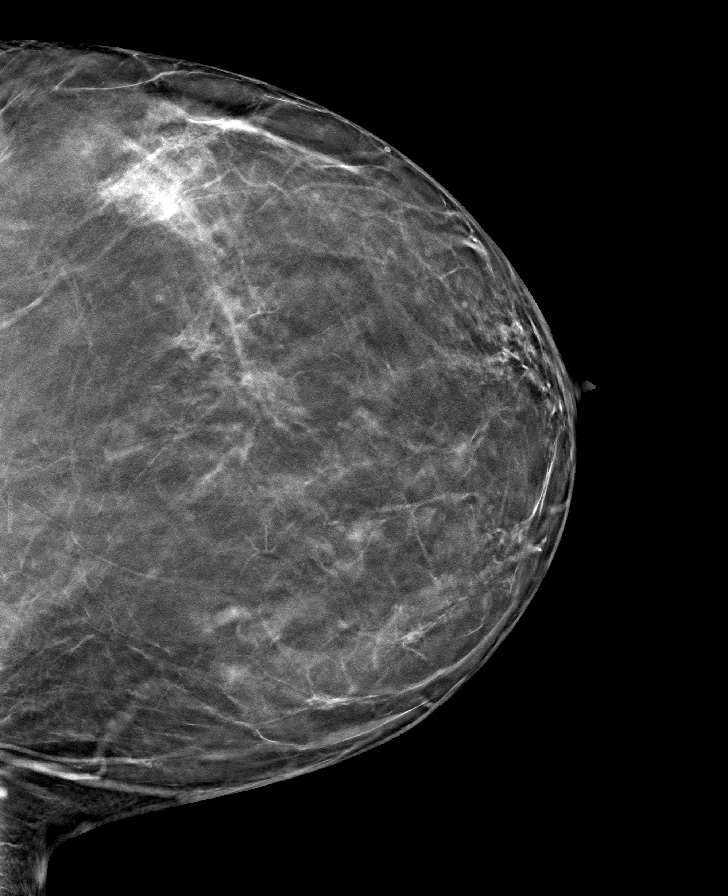

[R CC tomo · tomo slice 37/74.0]
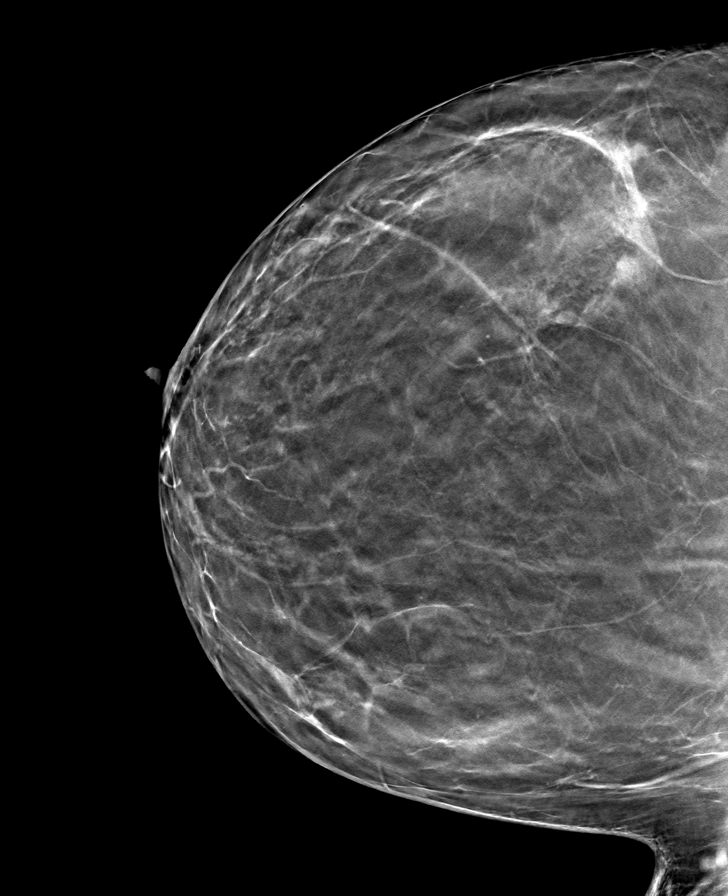

[R MLO tomo · tomo slice 41/82.0]
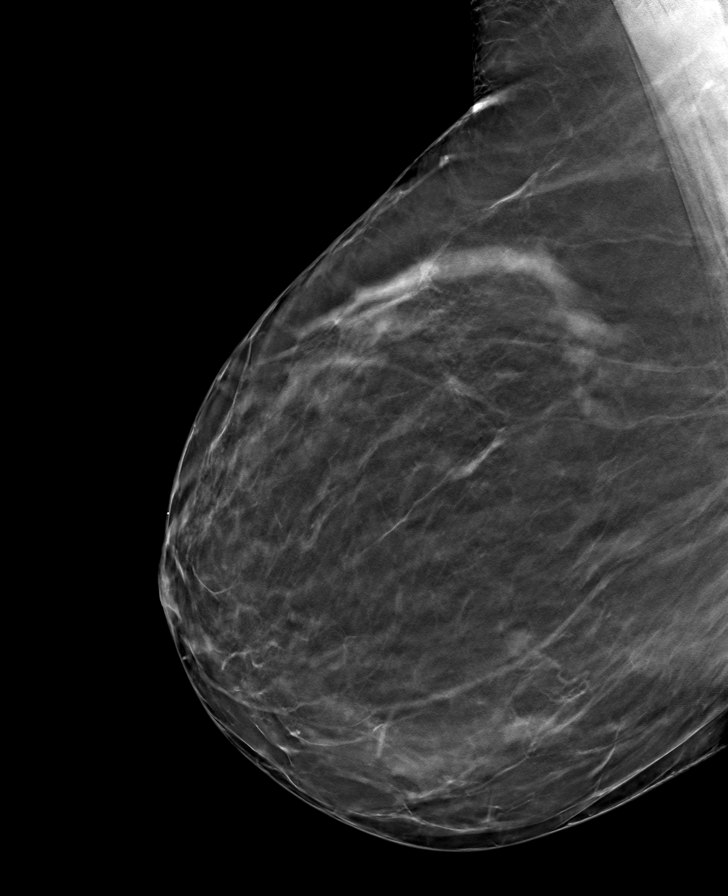

[8 of 24 positions shown; findings below may reference images not displayed]

ACR Breast Density Category b: There are scattered areas of
fibroglandular density.
FINDINGS: There are no findings suspicious for malignancy. Images were
processed with CAD.
IMPRESSION: No mammographic evidence of malignancy. A result letter of this
screening mammogram will be mailed directly to the patient.

RECOMMENDATION:
Screening mammogram in one year. (Code:[id number])

BI-RADS CATEGORY  1: Negative.

## 2020-09-06 DIAGNOSIS — M25571 Pain in right ankle and joints of right foot: Secondary | ICD-10-CM | POA: Diagnosis not present

## 2020-09-06 DIAGNOSIS — M5416 Radiculopathy, lumbar region: Secondary | ICD-10-CM | POA: Diagnosis not present

## 2020-09-08 DIAGNOSIS — M25571 Pain in right ankle and joints of right foot: Secondary | ICD-10-CM | POA: Diagnosis not present

## 2020-09-08 DIAGNOSIS — M5416 Radiculopathy, lumbar region: Secondary | ICD-10-CM | POA: Diagnosis not present

## 2020-09-13 DIAGNOSIS — H02889 Meibomian gland dysfunction of unspecified eye, unspecified eyelid: Secondary | ICD-10-CM | POA: Diagnosis not present

## 2020-09-13 DIAGNOSIS — H02883 Meibomian gland dysfunction of right eye, unspecified eyelid: Secondary | ICD-10-CM | POA: Diagnosis not present

## 2020-09-14 ENCOUNTER — Encounter: Payer: Self-pay | Admitting: Dietician

## 2020-09-14 ENCOUNTER — Other Ambulatory Visit: Payer: Self-pay

## 2020-09-14 ENCOUNTER — Encounter: Payer: Medicare Other | Attending: Family Medicine | Admitting: Dietician

## 2020-09-14 DIAGNOSIS — Z713 Dietary counseling and surveillance: Secondary | ICD-10-CM | POA: Diagnosis not present

## 2020-09-14 DIAGNOSIS — I10 Essential (primary) hypertension: Secondary | ICD-10-CM | POA: Diagnosis not present

## 2020-09-14 DIAGNOSIS — E1169 Type 2 diabetes mellitus with other specified complication: Secondary | ICD-10-CM | POA: Insufficient documentation

## 2020-09-14 DIAGNOSIS — Z79899 Other long term (current) drug therapy: Secondary | ICD-10-CM | POA: Insufficient documentation

## 2020-09-14 NOTE — Patient Instructions (Addendum)
Become a member at the [facility], and begin swimming twice a week for 30-60 minutes.  Consider bringing your stationary bike back into the house after you take the Christmas tree down.  Begin to keep steam in bag vegetables in the freezer to cook with your meals.  Cook big meals so that you can have leftovers!  Try to have breakfast within an hour of waking up to help jumpstart your metabolism.  Work towards eating three meals a day, about 5-6 hours apart!  Begin to build your meals using the proportions of the Balanced Plate. . First, select your starch choice for the meal  . Next, select your source of protein to pair with your starch . Finally, complete the remaining half of your meal with a variety of non-starchy vegetables.

## 2020-09-14 NOTE — Progress Notes (Signed)
Medical Nutrition Therapy  Appointment Start time:  0930  Appointment End time:  1045  Primary concerns today: Weight loss  Referral diagnosis: E11.69 Type 2 Diabetes with other specified complication Preferred learning style: No preference indicated Learning readiness: Ready   NUTRITION ASSESSMENT   Anthropometrics  Ht:5'2" Wt: 208.7 lbs Body mass index is 38.17 kg/m.    Clinical Medical Hx: Knee and hip replacement, HTN Medications: Ozempic, amlodipine, Prednisone as needed Labs:  A1c - 5.4, potentially erroneous reading of 9.6 a month previous. Notable Signs/Symptoms: Central adiposity  Lifestyle & Dietary Hx Pt states that she would like some kind of guide or menu plan. States that she knows what she should eat, but doesn't eat it. Pt moved to [place] about 2 years ago from [place], [place]. Pt  lives with her two daughters and her grandson, and cooks for them. Usually cooks them chicken wings, or hamburgers. Pt states she will cook whatever she wants to eat a few times a week. Pt reports having an in home health care visit and finding out her A1c was 9.6 about a month ago, then on December 15th got another POC A1c test that returned a value of 5.4. Pt believes 9.6 A1c was incorrect, has no history of diabetes. Pt reports she stopped eating sweets after getting her 9.6 A1c reading. Pt reports taking Prednisone when asthma flares up. Treats with a 10 day course, and last time was in August. 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 Husband passed away in 25-Jan-2014. Pt reports that she believes carbs make her gain weight. Pt expresses food choices in terms of good or bad. Pt states she doesn't eat enough vegetables, wants to eat more. Pt states that sometimes she will eat meals in a day if she is busy, first at 1 pm, second at 5 pm. Pt states that she does not have an appetite when first waking.  Usual Daily  Schedule: Wakes up around 9 pm 11 am - First meal 3:30 pm - Second Meal 6:30-7:30 pm - Third Meal Sleeps around 11 pm     Estimated daily fluid intake: 48 oz Supplements: Vitamin D, Fish oil, Sleep3  Sleep: Sleeps 8 hours usually. Stress / self-care: Low Current average weekly physical activity: ADLs  24-Hr Dietary Recall First Meal: Coffee with caramel almond milk creamer Snack: McDonald's McChicken, with 1 half of the bun. Second Meal: none Snack: Caprese with tomato, mozzeralla, and basil, olive oil and [place] dressing Third Meal: Prime rib Snack: 1 chocolate chip cookie Beverages: coffee, water, diet gingerale   NUTRITION DIAGNOSIS  NB-1.1 Food and nutrition-related knowledge deficit As related to obesity.  As evidenced by BMI of 38.17, lack of physical activity,  and occasionally skipping meals..   NUTRITION INTERVENTION  Nutrition education (E-1) on the following topics:  Educated patient on the balanced plate eating model. Recommended lunch and dinner be 1/2 non-starchy vegetables, 1/4 starches, and 1/4 protein. Recommended breakfast be a balance of starch and protein with a piece of fruit. Discussed with patient the importance of working towards hitting the proportions of the balanced plate consistently. Counseled patient on ways to begin recognizing each of the food groups from the balanced plate in their own meals, and how close they are to fitting the recommended proportions of the balanced plate. Educated patient on the nutritional value of each food group on the balanced plate model. Educated patient on mindful eating, including listening to  their body's hunger and satiety cues, as well as eating slowly and allowing meals to be more of a sensory experience. Educated patient on the difference between energy dense, and nutrient dense foods. Discussed strategies to shift patient's meals to contain more nutrient dense foods.    MONITORING & EVALUATION Dietary intake,  and weekly physical activity in 1 month.  Next Steps  Patient is to follow up with dietitian.

## 2020-09-19 DIAGNOSIS — S29012A Strain of muscle and tendon of back wall of thorax, initial encounter: Secondary | ICD-10-CM | POA: Diagnosis not present

## 2020-09-19 DIAGNOSIS — M9901 Segmental and somatic dysfunction of cervical region: Secondary | ICD-10-CM | POA: Diagnosis not present

## 2020-09-19 DIAGNOSIS — M9902 Segmental and somatic dysfunction of thoracic region: Secondary | ICD-10-CM | POA: Diagnosis not present

## 2020-09-19 DIAGNOSIS — M9903 Segmental and somatic dysfunction of lumbar region: Secondary | ICD-10-CM | POA: Diagnosis not present

## 2020-09-19 DIAGNOSIS — M5137 Other intervertebral disc degeneration, lumbosacral region: Secondary | ICD-10-CM | POA: Diagnosis not present

## 2020-09-19 DIAGNOSIS — M9905 Segmental and somatic dysfunction of pelvic region: Secondary | ICD-10-CM | POA: Diagnosis not present

## 2020-09-19 DIAGNOSIS — M5136 Other intervertebral disc degeneration, lumbar region: Secondary | ICD-10-CM | POA: Diagnosis not present

## 2020-09-19 DIAGNOSIS — M5032 Other cervical disc degeneration, mid-cervical region, unspecified level: Secondary | ICD-10-CM | POA: Diagnosis not present

## 2020-09-20 NOTE — Telephone Encounter (Signed)
Pt had Nucala injection on 12.9.2021 and has an upcoming appt for next Nucala injection on 1.6.2022. Will close encounter.

## 2020-09-22 ENCOUNTER — Other Ambulatory Visit: Payer: Self-pay

## 2020-09-22 ENCOUNTER — Ambulatory Visit (INDEPENDENT_AMBULATORY_CARE_PROVIDER_SITE_OTHER): Payer: Medicare Other

## 2020-09-22 DIAGNOSIS — J4540 Moderate persistent asthma, uncomplicated: Secondary | ICD-10-CM | POA: Diagnosis not present

## 2020-09-22 MED ORDER — MEPOLIZUMAB 100 MG ~~LOC~~ SOLR: 100.0000 mg | Freq: Once | SUBCUTANEOUS | Status: DC

## 2020-09-22 MED ORDER — MEPOLIZUMAB 100 MG ~~LOC~~ SOLR
100.0000 mg | Freq: Once | SUBCUTANEOUS | Status: AC
  Administered 2020-09-22: 100 mg via SUBCUTANEOUS

## 2020-09-22 NOTE — Progress Notes (Signed)
Have you been hospitalized within the last 10 days?  No Do you have a fever?  No Do you have a cough?  No Do you have a headache or sore throat? No Do you have your Epi Pen visible and is it within date?  Yes 

## 2020-09-24 ENCOUNTER — Other Ambulatory Visit: Payer: Self-pay | Admitting: Emergency Medicine

## 2020-10-10 ENCOUNTER — Telehealth: Payer: Self-pay | Admitting: Emergency Medicine

## 2020-10-10 NOTE — Telephone Encounter (Signed)
Nucala Order: 100mg  #1 Vial Order Date: 10/10/20 Expected date of arrival: 10/11/20 Ordered by: [place]: Nigel Mormon

## 2020-10-11 NOTE — Telephone Encounter (Signed)
Nucala Shipment Received: 100mg  #1 vial Medication arrival date: 10/11/20 Lot #: [facility] Exp date: 01/15/2024 Received by: Elliot Dally

## 2020-10-13 ENCOUNTER — Telehealth (INDEPENDENT_AMBULATORY_CARE_PROVIDER_SITE_OTHER): Payer: Medicare Other | Admitting: Family Medicine

## 2020-10-13 ENCOUNTER — Encounter: Payer: Self-pay | Admitting: Family Medicine

## 2020-10-13 DIAGNOSIS — M79641 Pain in right hand: Secondary | ICD-10-CM | POA: Diagnosis not present

## 2020-10-13 DIAGNOSIS — W5501XA Bitten by cat, initial encounter: Secondary | ICD-10-CM

## 2020-10-13 DIAGNOSIS — Z23 Encounter for immunization: Secondary | ICD-10-CM

## 2020-10-13 MED ORDER — AMOXICILLIN-POT CLAVULANATE 875-125 MG PO TABS: 1.0000 | ORAL_TABLET | Freq: Two times a day (BID) | ORAL | 0 refills | Status: AC

## 2020-10-13 NOTE — Progress Notes (Signed)
Virtual Visit via Video Note  I connected with Ana Hughes on 10/13/20 at  4:00 PM EST by a video enabled telemedicine application 2/2 IOEVO-35 pandemic and verified that I am speaking with the correct person using two identifiers.  Location patient: home Location provider:work or home office Persons participating in the virtual visit: patient, provider  I discussed the limitations of evaluation and management by telemedicine and the availability of in person appointments. The patient expressed understanding and agreed to proceed.   HPI: Pt is a 70 year old female with past medical history significant for arthritis, asthma, depression, GERD, glaucoma, HTN who is followed by Dr. [place] and seen today for acute concern.  Pt tried to put ear gtts in her cats ear last night and was bitten on her R hand.  Pt washed her hand and applied antibiotic ointment to it.  Today pt notes mild edema, erythema, and tightness.  No recent Tetanus shot.  ROS: See pertinent positives and negatives per HPI.  Past Medical History:  Diagnosis Date  . Arthritis   . Asthma   . Depression   . Glaucoma   . History of colon polyps   . Hypertension     Past Surgical History:  Procedure Laterality Date  . [place]  . REPLACEMENT TOTAL KNEE BILATERAL Bilateral    left knee, 2005, right knee 2004  . TOTAL HIP ARTHROPLASTY Bilateral 012, right hip, 2013 left hip    Family History  Problem Relation Age of Onset  . Cancer Mother   . Hypertension Mother   . Cancer Father   . Depression Daughter   . Drug abuse Daughter   . Depression Daughter   . Drug abuse Daughter   . Heart attack Daughter   . Depression Daughter   . Drug abuse Daughter   . Mental illness Daughter      Current Outpatient Medications:  .  albuterol (PROVENTIL) (2.5 MG/3ML) 0.083% nebulizer solution, Take 3 mLs (2.5 mg total) by nebulization every 6 (six) hours as needed for wheezing or shortness of breath.,  Disp: 360 mL, Rfl: 3 .  albuterol (VENTOLIN HFA) 108 (90 Base) MCG/ACT inhaler, Inhale 2 puffs into the lungs every 4 (four) hours as needed for wheezing or shortness of breath., Disp: 8 g, Rfl: 5 .  amLODipine (NORVASC) 5 MG tablet, Take 1 tablet (5 mg total) by mouth daily., Disp: 90 tablet, Rfl: 3 .  benzonatate (TESSALON) 200 MG capsule, TAKE ONE CAPSULE BY MOUTH EVERY 8 HOURS AS NEEDED FOR COUGH, Disp: 60 capsule, Rfl: 0 .  bimatoprost (LUMIGAN) 0.01 % SOLN, Place 1 drop into both eyes at bedtime., Disp: , Rfl:  .  Blood Glucose Monitoring Suppl (ACCU-CHEK AVIVA CONNECT) w/Device KIT, 1 Device by Does not apply route daily., Disp: 1 kit, Rfl: 0 .  dorzolamide (TRUSOPT) 2 % ophthalmic solution, Place 1 drop into both eyes 3 (three) times daily., Disp: , Rfl:  .  DULERA 200-5 MCG/ACT AERO, USE 2 INHALATIONS TWICE A DAY (MUST HAVE OFFICE VISIT FOR FURTHER REFILLS), Disp: 39 g, Rfl: 3 .  FLUoxetine (PROZAC) 40 MG capsule, Take 1 capsule (40 mg total) by mouth daily., Disp: 90 capsule, Rfl: 1 .  gabapentin (NEURONTIN) 300 MG capsule, Take 1 capsule (300 mg total) by mouth daily as needed., Disp: 30 capsule, Rfl: 3 .  meloxicam (MOBIC) 15 MG tablet, TAKE ONE TABLET BY MOUTH DAILY AS NEEDED; MAKE DOCTOR'S APPOINTMENT FOR FURTHER REFILLS, Disp: 30 tablet, Rfl: 0 .  montelukast (SINGULAIR) 10 MG tablet, TAKE ONE TABLET BY MOUTH AT BEDTIME, Disp: 30 tablet, Rfl: 3 .  timolol (TIMOPTIC-XR) 0.25 % ophthalmic gel-forming, Place 1 drop into the left eye daily., Disp: , Rfl:   Current Facility-Administered Medications:  Marland Kitchen  Mepolizumab SOLR 100 mg, 100 mg, Subcutaneous, Q28 days, Collene Gobble, MD, 100 mg at 06/22/19 1322 .  Mepolizumab SOLR 100 mg, 100 mg, Subcutaneous, Q28 days, Collene Gobble, MD, 100 mg at 07/20/19 1407 .  Mepolizumab SOLR 100 mg, 100 mg, Subcutaneous, Q28 days, Collene Gobble, MD, 100 mg at 09/22/19 1156 .  Mepolizumab SOLR 100 mg, 100 mg, Subcutaneous, Q28 days, Collene Gobble, MD,  100 mg at 11/27/19 1327 .  Mepolizumab SOLR 100 mg, 100 mg, Subcutaneous, Q28 days, Collene Gobble, MD, 100 mg at 04/27/20 1613  EXAM:  VITALS per patient if applicable: RR between 01-75 bpm  GENERAL: alert, oriented, appears well and in no acute distress  HEENT: atraumatic, conjunctiva clear, no obvious abnormalities on inspection of external nose and ears  NECK: normal movements of the head and neck  LUNGS: on inspection no signs of respiratory distress, breathing rate appears normal, no obvious gross SOB, gasping or wheezing  Skin:  Mild erythema of dorsum of R hand with puncture wound of 1st dorsal interosseous muscle and in webspace between first and second digits.  No purulent drainage noted.  CV: no obvious cyanosis  MS: moves all visible extremities without noticeable abnormality  PSYCH/NEURO: pleasant and cooperative, no obvious depression or anxiety, speech and thought processing grossly intact  ASSESSMENT AND PLAN:  Discussed the following assessment and plan:  Cat bite, initial encounter  -Discussed supportive care -We will start Augmentin twice daily -We will have patient get tetanus vaccine in AM. -Patient given strict precautions for worsening symptoms - Plan: amoxicillin-clavulanate (AUGMENTIN) 875-125 MG tablet  Right hand pain -Supportive care  Follow-up as needed    I discussed the assessment and treatment plan with the patient. The patient was provided an opportunity to ask questions and all were answered. The patient agreed with the plan and demonstrated an understanding of the instructions.   The patient was advised to call back or seek an in-person evaluation if the symptoms worsen or if the condition fails to improve as anticipated.   Billie Ruddy, MD

## 2020-10-14 ENCOUNTER — Other Ambulatory Visit: Payer: Self-pay | Admitting: Primary Care

## 2020-10-14 ENCOUNTER — Telehealth: Payer: Self-pay | Admitting: Family Medicine

## 2020-10-14 ENCOUNTER — Encounter: Payer: Self-pay | Admitting: Family Medicine

## 2020-10-14 ENCOUNTER — Other Ambulatory Visit: Payer: Medicare Other

## 2020-10-17 DIAGNOSIS — M545 Low back pain, unspecified: Secondary | ICD-10-CM | POA: Diagnosis not present

## 2020-10-17 DIAGNOSIS — M25551 Pain in right hip: Secondary | ICD-10-CM | POA: Diagnosis not present

## 2020-10-17 DIAGNOSIS — M5441 Lumbago with sciatica, right side: Secondary | ICD-10-CM | POA: Diagnosis not present

## 2020-10-20 ENCOUNTER — Other Ambulatory Visit: Payer: Self-pay

## 2020-10-20 ENCOUNTER — Ambulatory Visit (INDEPENDENT_AMBULATORY_CARE_PROVIDER_SITE_OTHER): Payer: Medicare Other

## 2020-10-20 DIAGNOSIS — J4540 Moderate persistent asthma, uncomplicated: Secondary | ICD-10-CM | POA: Diagnosis not present

## 2020-10-20 MED ORDER — MEPOLIZUMAB 100 MG ~~LOC~~ SOLR
100.0000 mg | Freq: Once | SUBCUTANEOUS | Status: AC
  Administered 2020-10-20: 100 mg via SUBCUTANEOUS

## 2020-10-20 NOTE — Progress Notes (Signed)
Have you been hospitalized within the last 10 days?  No Do you have a fever?  No Do you have a cough?  No Do you have a headache or sore throat? No Do you have your Epi Pen visible and is it within date?  Yes 

## 2020-10-21 DIAGNOSIS — Z20822 Contact with and (suspected) exposure to covid-19: Secondary | ICD-10-CM | POA: Diagnosis not present

## 2020-11-02 ENCOUNTER — Other Ambulatory Visit: Payer: Self-pay | Admitting: Emergency Medicine

## 2020-11-03 DIAGNOSIS — M9905 Segmental and somatic dysfunction of pelvic region: Secondary | ICD-10-CM | POA: Diagnosis not present

## 2020-11-03 DIAGNOSIS — M9903 Segmental and somatic dysfunction of lumbar region: Secondary | ICD-10-CM | POA: Diagnosis not present

## 2020-11-03 DIAGNOSIS — M5136 Other intervertebral disc degeneration, lumbar region: Secondary | ICD-10-CM | POA: Diagnosis not present

## 2020-11-03 DIAGNOSIS — M9902 Segmental and somatic dysfunction of thoracic region: Secondary | ICD-10-CM | POA: Diagnosis not present

## 2020-11-03 DIAGNOSIS — M9901 Segmental and somatic dysfunction of cervical region: Secondary | ICD-10-CM | POA: Diagnosis not present

## 2020-11-03 DIAGNOSIS — S29012A Strain of muscle and tendon of back wall of thorax, initial encounter: Secondary | ICD-10-CM | POA: Diagnosis not present

## 2020-11-03 DIAGNOSIS — M5137 Other intervertebral disc degeneration, lumbosacral region: Secondary | ICD-10-CM | POA: Diagnosis not present

## 2020-11-03 DIAGNOSIS — M5032 Other cervical disc degeneration, mid-cervical region, unspecified level: Secondary | ICD-10-CM | POA: Diagnosis not present

## 2020-11-07 ENCOUNTER — Telehealth: Payer: Self-pay | Admitting: Emergency Medicine

## 2020-11-07 NOTE — Telephone Encounter (Signed)
Nucala Order: 100mg  #1 Vial Order Date: 11/07/20 Expected date of arrival: 11/08/20 Ordered by: [place]: Nigel Mormon

## 2020-11-08 NOTE — Telephone Encounter (Signed)
Nucala Shipment Received: 100mg  #1 vial Medication arrival date: 11/08/20 Lot #: [facility] Exp date: 01/15/2024 Received by: Elliot Dally

## 2020-11-09 ENCOUNTER — Ambulatory Visit: Payer: Medicare Other | Admitting: Dietician

## 2020-11-13 ENCOUNTER — Other Ambulatory Visit (HOSPITAL_COMMUNITY): Payer: Self-pay | Admitting: Pharmacy Technician

## 2020-11-13 DIAGNOSIS — J4540 Moderate persistent asthma, uncomplicated: Secondary | ICD-10-CM

## 2020-11-17 ENCOUNTER — Other Ambulatory Visit: Payer: Self-pay

## 2020-11-17 ENCOUNTER — Ambulatory Visit (INDEPENDENT_AMBULATORY_CARE_PROVIDER_SITE_OTHER): Payer: Medicare Other

## 2020-11-17 DIAGNOSIS — J4540 Moderate persistent asthma, uncomplicated: Secondary | ICD-10-CM

## 2020-11-17 MED ORDER — MEPOLIZUMAB 100 MG ~~LOC~~ SOLR
100.0000 mg | Freq: Once | SUBCUTANEOUS | Status: AC
  Administered 2020-11-17: 100 mg via SUBCUTANEOUS

## 2020-11-17 NOTE — Progress Notes (Signed)
Have you been hospitalized within the last 10 days?  No Do you have a fever?  No Do you have a cough?  No Do you have a headache or sore throat? No  

## 2020-11-18 DIAGNOSIS — H401122 Primary open-angle glaucoma, left eye, moderate stage: Secondary | ICD-10-CM | POA: Diagnosis not present

## 2020-11-18 DIAGNOSIS — H401111 Primary open-angle glaucoma, right eye, mild stage: Secondary | ICD-10-CM | POA: Diagnosis not present

## 2020-11-24 ENCOUNTER — Telehealth: Payer: Self-pay | Admitting: Family Medicine

## 2020-11-24 DIAGNOSIS — M5136 Other intervertebral disc degeneration, lumbar region: Secondary | ICD-10-CM | POA: Diagnosis not present

## 2020-11-24 DIAGNOSIS — M5032 Other cervical disc degeneration, mid-cervical region, unspecified level: Secondary | ICD-10-CM | POA: Diagnosis not present

## 2020-11-24 DIAGNOSIS — S29012A Strain of muscle and tendon of back wall of thorax, initial encounter: Secondary | ICD-10-CM | POA: Diagnosis not present

## 2020-11-24 DIAGNOSIS — M5137 Other intervertebral disc degeneration, lumbosacral region: Secondary | ICD-10-CM | POA: Diagnosis not present

## 2020-11-24 DIAGNOSIS — M9902 Segmental and somatic dysfunction of thoracic region: Secondary | ICD-10-CM | POA: Diagnosis not present

## 2020-11-24 DIAGNOSIS — M9905 Segmental and somatic dysfunction of pelvic region: Secondary | ICD-10-CM | POA: Diagnosis not present

## 2020-11-24 DIAGNOSIS — M9903 Segmental and somatic dysfunction of lumbar region: Secondary | ICD-10-CM | POA: Diagnosis not present

## 2020-11-24 DIAGNOSIS — M9901 Segmental and somatic dysfunction of cervical region: Secondary | ICD-10-CM | POA: Diagnosis not present

## 2020-11-24 NOTE — Telephone Encounter (Signed)
Left message for patient to call back and schedule Medicare Annual Wellness Visit (AWV) either virtually or in office. No detailed message left   AWVI  please schedule at anytime with [facility] Nurse Health Advisor 1 or 2   This should be a 45 minute visit. 

## 2020-11-27 ENCOUNTER — Other Ambulatory Visit: Payer: Self-pay | Admitting: Emergency Medicine

## 2020-11-30 ENCOUNTER — Ambulatory Visit: Payer: Self-pay | Admitting: Orthopedic Surgery

## 2020-12-05 ENCOUNTER — Other Ambulatory Visit: Payer: Self-pay | Admitting: Emergency Medicine

## 2020-12-13 NOTE — Progress Notes (Signed)
Subjective:   Ana Hughes is a 70 y.o. female who presents for an Initial Medicare Annual Wellness Visit.  Review of Systems    N/A  Cardiac Risk Factors include: advanced age (>76mn, >>33women);hypertension;obesity (BMI >30kg/m2)     Objective:    Today's Vitals   12/14/20 1323  BP: 118/74  Pulse: (!) 54  Temp: 98.4 F (36.9 C)  TempSrc: Oral  SpO2: 97%  Weight: 206 lb 9 oz (93.7 kg)  Height: 5' 2"  (1.575 m)   Body mass index is 37.78 kg/m.  Advanced Directives 12/14/2020 09/14/2020  Does Patient Have a Medical Advance Directive? No No  Would patient like information on creating a medical advance directive? No - Patient declined No - Patient declined    Current Medications (verified) Outpatient Encounter Medications as of 12/14/2020  Medication Sig  . amLODipine (NORVASC) 5 MG tablet Take 1 tablet (5 mg total) by mouth daily.  . bimatoprost (LUMIGAN) 0.01 % SOLN Place 1 drop into both eyes at bedtime.  . dorzolamide (TRUSOPT) 2 % ophthalmic solution Place 1 drop into both eyes 3 (three) times daily.  . DULERA 200-5 MCG/ACT AERO USE 2 INHALATIONS TWICE A DAY (MUST HAVE OFFICE VISIT FOR FURTHER REFILLS)  . EPINEPHRINE 0.3 mg/0.3 mL IJ SOAJ injection INJECT INTO THE MIDDLE OF THE OUTER THIGH AND HOLD FOR 3 SECONDS AS NEEDED FOR SEVERE ALLERGIC REACTION THEN CALL 911 IF USED.  .Marland KitchenFLUoxetine (PROZAC) 40 MG capsule Take 1 capsule (40 mg total) by mouth daily.  .Marland Kitchengabapentin (NEURONTIN) 300 MG capsule Take 1 capsule (300 mg total) by mouth daily as needed.  . meloxicam (MOBIC) 15 MG tablet TAKE ONE TABLET BY MOUTH DAILY AS NEEDED; MAKE DOCTOR'S APPOINTMENT FOR FURTHER REFILLS  . montelukast (SINGULAIR) 10 MG tablet TAKE ONE TABLET BY MOUTH AT BEDTIME  . timolol (TIMOPTIC-XR) 0.25 % ophthalmic gel-forming Place 1 drop into the left eye daily.  .Marland Kitchenalbuterol (PROVENTIL) (2.5 MG/3ML) 0.083% nebulizer solution Take 3 mLs (2.5 mg total) by nebulization every 6 (six) hours as  needed for wheezing or shortness of breath. (Patient not taking: Reported on 12/14/2020)  . albuterol (VENTOLIN HFA) 108 (90 Base) MCG/ACT inhaler Inhale 2 puffs into the lungs every 4 (four) hours as needed for wheezing or shortness of breath. (Patient not taking: Reported on 12/14/2020)  . [DISCONTINUED] benzonatate (TESSALON) 200 MG capsule TAKE ONE CAPSULE BY MOUTH EVERY 8 HOURS AS NEEDED FOR COUGH  . [DISCONTINUED] Blood Glucose Monitoring Suppl (ACCU-CHEK AVIVA CONNECT) w/Device KIT 1 Device by Does not apply route daily.   Facility-Administered Encounter Medications as of 12/14/2020  Medication  . Mepolizumab SOLR 100 mg  . Mepolizumab SOLR 100 mg  . Mepolizumab SOLR 100 mg  . Mepolizumab SOLR 100 mg  . Mepolizumab SOLR 100 mg    Allergies (verified) Simbrinza [brinzolamide-brimonidine]   History: Past Medical History:  Diagnosis Date  . Arthritis   . Asthma   . Depression   . Glaucoma   . History of colon polyps   . Hypertension    Past Surgical History:  Procedure Laterality Date  . CNapavine . REPLACEMENT TOTAL KNEE BILATERAL Bilateral    left knee, 2005, right knee 2004  . TOTAL HIP ARTHROPLASTY Bilateral 012, right hip, 2013 left hip   Family History  Problem Relation Age of Onset  . Cancer Mother   . Hypertension Mother   . Cancer Father   . Depression Daughter   .  Drug abuse Daughter   . Depression Daughter   . Drug abuse Daughter   . Heart attack Daughter   . Depression Daughter   . Drug abuse Daughter   . Mental illness Daughter    Social History   Socioeconomic History  . Marital status: Widowed    Spouse name: Not on file  . Number of children: 3  . Years of education: Not on file  . Highest education level: Not on file  Occupational History  . Not on file  Tobacco Use  . Smoking status: Never Smoker  . Smokeless tobacco: Never Used  Vaping Use  . Vaping Use: Never used  Substance and Sexual Activity  . Alcohol  use: Yes  . Drug use: Never  . Sexual activity: Not Currently  Other Topics Concern  . Not on file  Social History Narrative  . Not on file   Social Determinants of Health   Financial Resource Strain: Low Risk   . Difficulty of Paying Living Expenses: Not hard at all  Food Insecurity: No Food Insecurity  . Worried About Charity fundraiser in the Last Year: Never true  . Ran Out of Food in the Last Year: Never true  Transportation Needs: No Transportation Needs  . Lack of Transportation (Medical): No  . Lack of Transportation (Non-Medical): No  Physical Activity: Inactive  . Days of Exercise per Week: 0 days  . Minutes of Exercise per Session: 0 min  Stress: No Stress Concern Present  . Feeling of Stress : Not at all  Social Connections: Socially Isolated  . Frequency of Communication with Friends and Family: Three times a week  . Frequency of Social Gatherings with Friends and Family: More than three times a week  . Attends Religious Services: Never  . Active Member of Clubs or Organizations: No  . Attends Archivist Meetings: Never  . Marital Status: Widowed    Tobacco Counseling Counseling given: Not Answered   Clinical Intake:  Pre-visit preparation completed: Yes  Pain : No/denies pain     Nutritional Risks: None Diabetes: No  How often do you need to have someone help you when you read instructions, pamphlets, or other written materials from your doctor or pharmacy?: 1 - Never  Diabetic?No   Interpreter Needed?: No  Information entered by :: [place] of Daily Living In your present state of health, do you have any difficulty performing the following activities: 12/14/2020  Hearing? 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 N  Difficulty concentrating or making decisions? N  Walking or climbing stairs? N  Dressing or bathing? N  Doing errands, shopping? N  Preparing Food and eating ? N  Using the Toilet? N  In the past six  months, have you accidently leaked urine? Y  Comment has some occasional leakage with urgency  Do you have problems with loss of bowel control? N  Managing your Medications? N  Managing your Finances? N  Housekeeping or managing your Housekeeping? N  Some recent data might be hidden    Patient Care Team: [place], Betty G, MD as PCP - General (Family Medicine)  Indicate any recent Medical Services you may have received from other than [facility] providers in the past year (date may be approximate).     Assessment:   This is a routine wellness examination for Ana Hughes.  Hearing/Vision screen  Hearing Screening   125Hz  250Hz  500Hz  1000Hz  2000Hz  3000Hz  4000Hz  6000Hz  8000Hz   Right ear:  Left ear:           Vision Screening Comments: Gets eye exams every 3 months for glaucoma, wears reading glasses and to drive.  Dietary issues and exercise activities discussed: Current Exercise Habits: The patient does not participate in regular exercise at present, Exercise limited by: None identified  Goals    . Weight (lb) < 175 lb (79.4 kg)      Depression Screen PHQ 2/9 Scores 12/14/2020 09/14/2020 08/31/2020  PHQ - 2 Score 1 0 1  PHQ- 9 Score 1 - 3    Fall Risk Fall Risk  12/14/2020 09/14/2020  Falls in the past year? 0 0  Number falls in past yr: 0 0  Injury with Fall? 0 -  Risk for fall due to : No Fall Risks -  Follow up Falls evaluation completed;Falls prevention discussed -    FALL RISK PREVENTION PERTAINING TO THE HOME:  Any stairs in or around the home? No  If so, are there any without handrails? No  Home free of loose throw rugs in walkways, pet beds, electrical cords, etc? Yes  Adequate lighting in your home to reduce risk of falls? Yes   ASSISTIVE DEVICES UTILIZED TO PREVENT FALLS:  Life alert? No  Use of a cane, walker or w/c? No  Grab bars in the bathroom? No  Shower chair or bench in shower? No  Elevated toilet seat or a handicapped toilet? No   TIMED UP AND  GO:  Was the test performed? Yes .  Length of time to ambulate 10 feet: 4 sec.   Gait steady and fast without use of assistive device  Cognitive Function:   Normal cognitive status assessed by direct observation by this Nurse Health Advisor. No abnormalities found.        Immunizations Immunization History  Administered Date(s) Administered  . Fluad Quad(high Dose 65+) 06/22/2019, 08/31/2020  . Influenza, High Dose Seasonal PF 07/03/2018  . PFIZER(Purple Top)SARS-COV-2 Vaccination 10/22/2019, 11/12/2019, 06/10/2020  . Tdap 10/01/2015  . Zoster Recombinat (Shingrix) 12/05/2020    TDAP status: Up to date  Flu Vaccine status: Up to date  Pneumococcal vaccine status: Due, Education has been provided regarding the importance of this vaccine. Advised may receive this vaccine at local pharmacy or Health Dept. Aware to provide a copy of the vaccination record if obtained from local pharmacy or Health Dept. Verbalized acceptance and understanding.  Covid-19 vaccine status: Completed vaccines  Qualifies for Shingles Vaccine? Yes   Zostavax completed No   Shingrix Completed?: No.    Education has been provided regarding the importance of this vaccine. Patient has been advised to call insurance company to determine out of pocket expense if they have not yet received this vaccine. Advised may also receive vaccine at local pharmacy or Health Dept. Verbalized acceptance and understanding.  Screening Tests Health Maintenance  Topic Date Due  . PNA vac Low Risk Adult (2 of 2 - PPSV23) 11/15/2017  . MAMMOGRAM  02/15/2021  . COLONOSCOPY (Pts 45-33yr Insurance coverage will need to be confirmed)  04/18/2021  . URINE MICROALBUMIN  08/31/2021  . TETANUS/TDAP  09/30/2025  . INFLUENZA VACCINE  Completed  . DEXA SCAN  Completed  . COVID-19 Vaccine  Completed  . Hepatitis C Screening  Completed  . HPV VACCINES  Aged Out    Health Maintenance  Health Maintenance Due  Topic Date Due  . PNA  vac Low Risk Adult (2 of 2 - PPSV23) 11/15/2017    Colorectal cancer  screening: Type of screening: Colonoscopy. Completed 04/18/2016. Repeat every 5 years  Mammogram status: Completed 02/16/2020. Repeat every year  Bone Density status: Completed 08/19/2020. Results reflect: Bone density results: OSTEOPENIA. Repeat every 5 years.  Lung Cancer Screening: (Low Dose CT Chest recommended if Age 56-80 years, 30 pack-year currently smoking OR have quit w/in 15years.) does not qualify.   Lung Cancer Screening Referral: N/A   Additional Screening:  Hepatitis C Screening: does qualify; Completed 08/31/2020  Vision Screening: Recommended annual ophthalmology exams for early detection of glaucoma and other disorders of the eye. Is the patient up to date with their annual eye exam?  Yes  Who is the provider or what is the name of the office in which the patient attends annual eye exams? Dr. Lysle Morales If pt is not established with a provider, would they like to be referred to a provider to establish care? No .   Dental Screening: Recommended annual dental exams for proper oral hygiene  Community Resource Referral / Chronic Care Management: CRR required this visit?  No   CCM required this visit?  No      Plan:     I have personally reviewed and noted the following in the patient's chart:   . Medical and social history . Use of alcohol, tobacco or illicit drugs  . Current medications and supplements . Functional ability and status . Nutritional status . Physical activity . Advanced directives . List of other physicians . Hospitalizations, surgeries, and ER visits in previous 12 months . Vitals . Screenings to include cognitive, depression, and falls . Referrals and appointments  In addition, I have reviewed and discussed with patient certain preventive protocols, quality metrics, and best practice recommendations. A written personalized care plan for preventive services as well as general  preventive health recommendations were provided to patient.     Ofilia Neas, LPN   4/83/2346   Nurse Notes: None

## 2020-12-14 ENCOUNTER — Other Ambulatory Visit: Payer: Self-pay

## 2020-12-14 ENCOUNTER — Ambulatory Visit (INDEPENDENT_AMBULATORY_CARE_PROVIDER_SITE_OTHER): Payer: Medicare Other

## 2020-12-14 VITALS — BP 118/74 | HR 54 | Temp 98.4°F | Ht 62.0 in | Wt 206.6 lb

## 2020-12-14 DIAGNOSIS — Z0000 Encounter for general adult medical examination without abnormal findings: Secondary | ICD-10-CM

## 2020-12-14 DIAGNOSIS — Z1211 Encounter for screening for malignant neoplasm of colon: Secondary | ICD-10-CM | POA: Diagnosis not present

## 2020-12-14 NOTE — Patient Instructions (Signed)
[name] , Thank you for taking time to come for your Medicare Wellness Visit. I appreciate your ongoing commitment to your health goals. Please review the following plan we discussed and let me know if I can assist you in the future.   Screening recommendations/referrals: Colonoscopy: Up to date, next due 08/31/2021 Mammogram: Up to date, next due 02/15/2021 Bone Density: Up to date, next due 09/01/2023 Recommended yearly ophthalmology/optometry visit for glaucoma screening and checkup Recommended yearly dental visit for hygiene and checkup  Vaccinations: Influenza vaccine: Up to date, next due fall 2022  Pneumococcal vaccine: Currently due Prevnar 20, you may receive at your next office visit Tdap vaccine: Up to date, next due 09/30/2025 Shingles vaccine: Up to date, next due 02/04/2021    Advanced directives: Advance directive discussed with you today. Even though you declined this today please call our office should you change your mind and we can give you the proper paperwork for you to fill out.   Conditions/risks identified: None   Next appointment: None   Preventive Care 65 Years and Older, Female Preventive care refers to lifestyle choices and visits with your health care provider that can promote health and wellness. What does preventive care include?  A yearly physical exam. This is also called an annual well check.  Dental exams once or twice a year.  Routine eye exams. Ask your health care provider how often you should have your eyes checked.  Personal lifestyle choices, including:  Daily care of your teeth and gums.  Regular physical activity.  Eating a healthy diet.  Avoiding tobacco and drug use.  Limiting alcohol use.  Practicing safe sex.  Taking low-dose aspirin every day.  Taking vitamin and mineral supplements as recommended by your health care provider. What happens during an annual well check? The services and screenings done by your health  care provider during your annual well check will depend on your age, overall health, lifestyle risk factors, and family history of disease. Counseling  Your health care provider may ask you questions about your:  Alcohol use.  Tobacco use.  Drug use.  Emotional well-being.  Home and relationship well-being.  Sexual activity.  Eating habits.  History of falls.  Memory and ability to understand (cognition).  Work and work Statistician.  Reproductive health. Screening  You may have the following tests or measurements:  Height, weight, and BMI.  Blood pressure.  Lipid and cholesterol levels. These may be checked every 5 years, or more frequently if you are over 15 years old.  Skin check.  Lung cancer screening. You may have this screening every year starting at age 37 if you have a 30-pack-year history of smoking and currently smoke or have quit within the past 15 years.  Fecal occult blood test (FOBT) of the stool. You may have this test every year starting at age 70.  Flexible sigmoidoscopy or colonoscopy. You may have a sigmoidoscopy every 5 years or a colonoscopy every 10 years starting at age 48.  Hepatitis C blood test.  Hepatitis B blood test.  Sexually transmitted disease (STD) testing.  Diabetes screening. This is done by checking your blood sugar (glucose) after you have not eaten for a while (fasting). You may have this done every 1-3 years.  Bone density scan. This is done to screen for osteoporosis. You may have this done starting at age 58.  Mammogram. This may be done every 1-2 years. Talk to your health care provider about how often you should  have regular mammograms. Talk with your health care provider about your test results, treatment options, and if necessary, the need for more tests. Vaccines  Your health care provider may recommend certain vaccines, such as:  Influenza vaccine. This is recommended every year.  Tetanus, diphtheria, and  acellular pertussis (Tdap, Td) vaccine. You may need a Td booster every 10 years.  Zoster vaccine. You may need this after age 77.  Pneumococcal 13-valent conjugate (PCV13) vaccine. One dose is recommended after age 25.  Pneumococcal polysaccharide (PPSV23) vaccine. One dose is recommended after age 42. Talk to your health care provider about which screenings and vaccines you need and how often you need them. This information is not intended to replace advice given to you by your health care provider. Make sure you discuss any questions you have with your health care provider. Document Released: 09/30/2015 Document Revised: 05/23/2016 Document Reviewed: 07/05/2015 Elsevier Interactive Patient Education  2017 [employer] Prevention in the Home Falls can cause injuries. They can happen to people of all ages. There are many things you can do to make your home safe and to help prevent falls. What can I do on the outside of my home?  Regularly fix the edges of walkways and driveways and fix any cracks.  Remove anything that might make you trip as you walk through a door, such as a raised step or threshold.  Trim any bushes or trees on the path to your home.  Use bright outdoor lighting.  Clear any walking paths of anything that might make someone trip, such as rocks or tools.  Regularly check to see if handrails are loose or broken. Make sure that both sides of any steps have handrails.  Any raised decks and porches should have guardrails on the edges.  Have any leaves, snow, or ice cleared regularly.  Use sand or salt on walking paths during winter.  Clean up any spills in your garage right away. This includes oil or grease spills. What can I do in the bathroom?  Use night lights.  Install grab bars by the toilet and in the tub and shower. Do not use towel bars as grab bars.  Use non-skid mats or decals in the tub or shower.  If you need to sit down in the shower, use  a plastic, non-slip stool.  Keep the floor dry. Clean up any water that spills on the floor as soon as it happens.  Remove soap buildup in the tub or shower regularly.  Attach bath mats securely with double-sided non-slip rug tape.  Do not have throw rugs and other things on the floor that can make you trip. What can I do in the bedroom?  Use night lights.  Make sure that you have a light by your bed that is easy to reach.  Do not use any sheets or blankets that are too big for your bed. They should not hang down onto the floor.  Have a firm chair that has side arms. You can use this for support while you get dressed.  Do not have throw rugs and other things on the floor that can make you trip. What can I do in the kitchen?  Clean up any spills right away.  Avoid walking on wet floors.  Keep items that you use a lot in easy-to-reach places.  If you need to reach something above you, use a strong step stool that has a grab bar.  Keep electrical cords out of  the way.  Do not use floor polish or wax that makes floors slippery. If you must use wax, use non-skid floor wax.  Do not have throw rugs and other things on the floor that can make you trip. What can I do with my stairs?  Do not leave any items on the stairs.  Make sure that there are handrails on both sides of the stairs and use them. Fix handrails that are broken or loose. Make sure that handrails are as long as the stairways.  Check any carpeting to make sure that it is firmly attached to the stairs. Fix any carpet that is loose or worn.  Avoid having throw rugs at the top or bottom of the stairs. If you do have throw rugs, attach them to the floor with carpet tape.  Make sure that you have a light switch at the top of the stairs and the bottom of the stairs. If you do not have them, ask someone to add them for you. What else can I do to help prevent falls?  Wear shoes that:  Do not have high heels.  Have  rubber bottoms.  Are comfortable and fit you well.  Are closed at the toe. Do not wear sandals.  If you use a stepladder:  Make sure that it is fully opened. Do not climb a closed stepladder.  Make sure that both sides of the stepladder are locked into place.  Ask someone to hold it for you, if possible.  Clearly mark and make sure that you can see:  Any grab bars or handrails.  First and last steps.  Where the edge of each step is.  Use tools that help you move around (mobility aids) if they are needed. These include:  Canes.  Walkers.  Scooters.  Crutches.  Turn on the lights when you go into a dark area. Replace any light bulbs as soon as they burn out.  Set up your furniture so you have a clear path. Avoid moving your furniture around.  If any of your floors are uneven, fix them.  If there are any pets around you, be aware of where they are.  Review your medicines with your doctor. Some medicines can make you feel dizzy. This can increase your chance of falling. Ask your doctor what other things that you can do to help prevent falls. This information is not intended to replace advice given to you by your health care provider. Make sure you discuss any questions you have with your health care provider. Document Released: 06/30/2009 Document Revised: 02/09/2016 Document Reviewed: 10/08/2014 Elsevier Interactive Patient Education  2017 [employer].

## 2020-12-15 ENCOUNTER — Ambulatory Visit (INDEPENDENT_AMBULATORY_CARE_PROVIDER_SITE_OTHER): Payer: Medicare Other

## 2020-12-15 ENCOUNTER — Telehealth: Payer: Self-pay | Admitting: Emergency Medicine

## 2020-12-15 VITALS — BP 133/81 | HR 56 | Temp 98.7°F | Resp 20

## 2020-12-15 DIAGNOSIS — J4520 Mild intermittent asthma, uncomplicated: Secondary | ICD-10-CM | POA: Diagnosis not present

## 2020-12-15 DIAGNOSIS — J4540 Moderate persistent asthma, uncomplicated: Secondary | ICD-10-CM | POA: Diagnosis not present

## 2020-12-15 MED ORDER — FAMOTIDINE IN NACL 20-0.9 MG/50ML-% IV SOLN: 20.0000 mg | Freq: Once | INTRAVENOUS | Status: DC | PRN

## 2020-12-15 MED ORDER — DIPHENHYDRAMINE HCL 50 MG/ML IJ SOLN: 50.0000 mg | Freq: Once | INTRAMUSCULAR | Status: DC | PRN

## 2020-12-15 MED ORDER — METHYLPREDNISOLONE SODIUM SUCC 125 MG IJ SOLR: 125.0000 mg | Freq: Once | INTRAMUSCULAR | Status: DC | PRN

## 2020-12-15 MED ORDER — ALBUTEROL SULFATE HFA 108 (90 BASE) MCG/ACT IN AERS: 2.0000 | INHALATION_SPRAY | Freq: Once | RESPIRATORY_TRACT | Status: DC | PRN

## 2020-12-15 MED ORDER — VITAMIN D (ERGOCALCIFEROL) 1.25 MG (50000 UNIT) PO CAPS: 50000.0000 [IU] | ORAL_CAPSULE | ORAL | 0 refills | Status: DC

## 2020-12-15 MED ORDER — SODIUM CHLORIDE 0.9 % IV SOLN: Freq: Once | INTRAVENOUS | Status: DC | PRN

## 2020-12-15 MED ORDER — MEPOLIZUMAB 100 MG/ML ~~LOC~~ SOSY
100.0000 mg | PREFILLED_SYRINGE | Freq: Once | SUBCUTANEOUS | Status: AC
  Administered 2020-12-15: 100 mg via SUBCUTANEOUS

## 2020-12-15 MED ORDER — EPINEPHRINE 0.3 MG/0.3ML IJ SOAJ: 0.3000 mg | Freq: Once | INTRAMUSCULAR | Status: DC | PRN

## 2020-12-15 NOTE — Telephone Encounter (Signed)
Called and spoke with pt who stated RB prescribed Vitamin D for her about 2 years ago and she was wanting to know if we might be able to send in a new Rx for this.  Stated to pt that she needed to contact PCP about getting Rx sent to pharmacy but she said yesterday when she was seen by PCP, they told her to contact our office due to [place] being the original ones who had sent Rx in for pt.  Per pt's prescription history, Vitamin D 50,000 units was sent to pharmacy for pt originally on 11/06/18 #4 caps but then on 04/27/19, Rx for 12 caps was sent in for pt. Both times RB being the prescriber.  Dr. Lamonte Sakai, please advise if you are okay with [place] sending Rx for vitamin D to pharmacy for pt or if this needs to be deferred to PCP.

## 2020-12-15 NOTE — Telephone Encounter (Signed)
I think I prescribed this because she was new from [place] and had not gotten a PCP yet. I'm Ok to refill it for 1 month to give her some time to get it changed over to her PCP.

## 2020-12-15 NOTE — Progress Notes (Signed)
Diagnosis: Asthma  Provider: Dr. Marshell Garfinkel   Procedure: Injection, Medication: Nucala 100mg , Site: subcutaneous right arm by Koren Shiver, RN  Discharge: Condition: Good, Destination: Home . AVS provided. by Koren Shiver, RN

## 2020-12-15 NOTE — Telephone Encounter (Signed)
Called and spoke with pt letting her know the info stated by RB and she verbalized understanding. Verified preferred pharmacy and sent Rx in for pt. I also did put a note on Rx that med needs to be deferred to PCP for further refills. Nothing further needed.

## 2020-12-19 NOTE — Progress Notes (Signed)
Diagnosis: Asthma  Provider:  Praveen Mannam, MD  Procedure: Injection  Nucala (Mepolizumab), Dose: 100 mg, Site: subcutaneous  Discharge: Condition: Good, Destination: Home . AVS provided to patient.   Performed by:  Kenady Doxtater, RN        

## 2021-01-02 ENCOUNTER — Encounter: Payer: Self-pay | Admitting: Family Medicine

## 2021-01-12 ENCOUNTER — Ambulatory Visit: Payer: Medicare Other

## 2021-01-13 ENCOUNTER — Other Ambulatory Visit: Payer: Self-pay

## 2021-01-13 ENCOUNTER — Ambulatory Visit (INDEPENDENT_AMBULATORY_CARE_PROVIDER_SITE_OTHER): Payer: Medicare Other

## 2021-01-13 VITALS — BP 143/81 | HR 49 | Temp 98.3°F | Resp 19

## 2021-01-13 DIAGNOSIS — J4540 Moderate persistent asthma, uncomplicated: Secondary | ICD-10-CM

## 2021-01-13 DIAGNOSIS — J4520 Mild intermittent asthma, uncomplicated: Secondary | ICD-10-CM

## 2021-01-13 MED ORDER — ALBUTEROL SULFATE HFA 108 (90 BASE) MCG/ACT IN AERS: 2.0000 | INHALATION_SPRAY | Freq: Once | RESPIRATORY_TRACT | Status: DC | PRN

## 2021-01-13 MED ORDER — FAMOTIDINE IN NACL 20-0.9 MG/50ML-% IV SOLN: 20.0000 mg | Freq: Once | INTRAVENOUS | Status: DC | PRN

## 2021-01-13 MED ORDER — SODIUM CHLORIDE 0.9 % IV SOLN: Freq: Once | INTRAVENOUS | Status: DC | PRN

## 2021-01-13 MED ORDER — DIPHENHYDRAMINE HCL 50 MG/ML IJ SOLN: 50.0000 mg | Freq: Once | INTRAMUSCULAR | Status: DC | PRN

## 2021-01-13 MED ORDER — MEPOLIZUMAB 100 MG/ML ~~LOC~~ SOSY
100.0000 mg | PREFILLED_SYRINGE | Freq: Once | SUBCUTANEOUS | Status: AC
Start: 2021-01-13 — End: 2021-01-13
  Administered 2021-01-13: 100 mg via SUBCUTANEOUS

## 2021-01-13 MED ORDER — METHYLPREDNISOLONE SODIUM SUCC 125 MG IJ SOLR: 125.0000 mg | Freq: Once | INTRAMUSCULAR | Status: DC | PRN

## 2021-01-13 MED ORDER — EPINEPHRINE 0.3 MG/0.3ML IJ SOAJ: 0.3000 mg | Freq: Once | INTRAMUSCULAR | Status: DC | PRN

## 2021-01-13 NOTE — Progress Notes (Signed)
Diagnosis: Asthma  Provider:  Marshell Garfinkel, MD  Procedure: Injection  Nucala (Mepolizumab), Dose: 100 mg, Site: subcutaneous  Discharge: Condition: Good, Destination: Home . AVS refused by patient.   Performed by:  Janine Ores, RN

## 2021-01-17 ENCOUNTER — Other Ambulatory Visit: Payer: Self-pay | Admitting: Emergency Medicine

## 2021-01-17 DIAGNOSIS — M9903 Segmental and somatic dysfunction of lumbar region: Secondary | ICD-10-CM | POA: Diagnosis not present

## 2021-01-17 DIAGNOSIS — M9901 Segmental and somatic dysfunction of cervical region: Secondary | ICD-10-CM | POA: Diagnosis not present

## 2021-01-17 DIAGNOSIS — S29012A Strain of muscle and tendon of back wall of thorax, initial encounter: Secondary | ICD-10-CM | POA: Diagnosis not present

## 2021-01-17 DIAGNOSIS — M5137 Other intervertebral disc degeneration, lumbosacral region: Secondary | ICD-10-CM | POA: Diagnosis not present

## 2021-01-17 DIAGNOSIS — M5032 Other cervical disc degeneration, mid-cervical region, unspecified level: Secondary | ICD-10-CM | POA: Diagnosis not present

## 2021-01-17 DIAGNOSIS — M9902 Segmental and somatic dysfunction of thoracic region: Secondary | ICD-10-CM | POA: Diagnosis not present

## 2021-01-17 DIAGNOSIS — M5136 Other intervertebral disc degeneration, lumbar region: Secondary | ICD-10-CM | POA: Diagnosis not present

## 2021-01-17 DIAGNOSIS — M9905 Segmental and somatic dysfunction of pelvic region: Secondary | ICD-10-CM | POA: Diagnosis not present

## 2021-02-09 ENCOUNTER — Ambulatory Visit: Payer: Medicare Other

## 2021-02-10 ENCOUNTER — Ambulatory Visit (INDEPENDENT_AMBULATORY_CARE_PROVIDER_SITE_OTHER): Payer: Medicare Other | Admitting: *Deleted

## 2021-02-10 ENCOUNTER — Other Ambulatory Visit: Payer: Self-pay

## 2021-02-10 VITALS — BP 143/87 | HR 65 | Temp 98.9°F | Resp 18

## 2021-02-10 DIAGNOSIS — J4540 Moderate persistent asthma, uncomplicated: Secondary | ICD-10-CM | POA: Diagnosis not present

## 2021-02-10 DIAGNOSIS — J4520 Mild intermittent asthma, uncomplicated: Secondary | ICD-10-CM

## 2021-02-10 MED ORDER — EPINEPHRINE 0.3 MG/0.3ML IJ SOAJ: 0.3000 mg | Freq: Once | INTRAMUSCULAR | Status: DC | PRN

## 2021-02-10 MED ORDER — FAMOTIDINE IN NACL 20-0.9 MG/50ML-% IV SOLN: 20.0000 mg | Freq: Once | INTRAVENOUS | Status: DC | PRN

## 2021-02-10 MED ORDER — SODIUM CHLORIDE 0.9 % IV SOLN: Freq: Once | INTRAVENOUS | Status: DC | PRN

## 2021-02-10 MED ORDER — ALBUTEROL SULFATE HFA 108 (90 BASE) MCG/ACT IN AERS: 2.0000 | INHALATION_SPRAY | Freq: Once | RESPIRATORY_TRACT | Status: DC | PRN

## 2021-02-10 MED ORDER — DIPHENHYDRAMINE HCL 50 MG/ML IJ SOLN: 50.0000 mg | Freq: Once | INTRAMUSCULAR | Status: DC | PRN

## 2021-02-10 MED ORDER — MEPOLIZUMAB 100 MG/ML ~~LOC~~ SOSY
100.0000 mg | PREFILLED_SYRINGE | Freq: Once | SUBCUTANEOUS | Status: AC
Start: 2021-02-10 — End: 2021-02-10
  Administered 2021-02-10: 100 mg via SUBCUTANEOUS
  Filled 2021-02-10: qty 1

## 2021-02-10 MED ORDER — METHYLPREDNISOLONE SODIUM SUCC 125 MG IJ SOLR: 125.0000 mg | Freq: Once | INTRAMUSCULAR | Status: DC | PRN

## 2021-02-10 NOTE — Progress Notes (Signed)
Diagnosis: Asthma  Provider:  Praveen Mannam, MD  Procedure: Injection  Nucala (Mepolizumab), Dose: 100 mg, Site: subcutaneous  Discharge: Condition: Good, Destination: Home . AVS provided to patient.   Performed by:  Kanav Kazmierczak A, RN        

## 2021-02-21 DIAGNOSIS — M9903 Segmental and somatic dysfunction of lumbar region: Secondary | ICD-10-CM | POA: Diagnosis not present

## 2021-02-21 DIAGNOSIS — M5032 Other cervical disc degeneration, mid-cervical region, unspecified level: Secondary | ICD-10-CM | POA: Diagnosis not present

## 2021-02-21 DIAGNOSIS — M9905 Segmental and somatic dysfunction of pelvic region: Secondary | ICD-10-CM | POA: Diagnosis not present

## 2021-02-21 DIAGNOSIS — S29012A Strain of muscle and tendon of back wall of thorax, initial encounter: Secondary | ICD-10-CM | POA: Diagnosis not present

## 2021-02-21 DIAGNOSIS — M5137 Other intervertebral disc degeneration, lumbosacral region: Secondary | ICD-10-CM | POA: Diagnosis not present

## 2021-02-21 DIAGNOSIS — M9902 Segmental and somatic dysfunction of thoracic region: Secondary | ICD-10-CM | POA: Diagnosis not present

## 2021-02-21 DIAGNOSIS — M5136 Other intervertebral disc degeneration, lumbar region: Secondary | ICD-10-CM | POA: Diagnosis not present

## 2021-02-21 DIAGNOSIS — M9901 Segmental and somatic dysfunction of cervical region: Secondary | ICD-10-CM | POA: Diagnosis not present

## 2021-02-28 NOTE — Progress Notes (Signed)
Diagnosis: Asthma  Provider:  Marshell Garfinkel, MD  Procedure: Injection  Nucala (Mepolizumab), Dose: 100 mg, Site: subcutaneous  Discharge: Condition: Good, Destination: Home . AVS provided to patient.   Performed by:  Mervyn Skeeters, RN

## 2021-03-03 DIAGNOSIS — H5203 Hypermetropia, bilateral: Secondary | ICD-10-CM | POA: Diagnosis not present

## 2021-03-03 DIAGNOSIS — H401122 Primary open-angle glaucoma, left eye, moderate stage: Secondary | ICD-10-CM | POA: Diagnosis not present

## 2021-03-03 DIAGNOSIS — H52223 Regular astigmatism, bilateral: Secondary | ICD-10-CM | POA: Diagnosis not present

## 2021-03-03 DIAGNOSIS — H401111 Primary open-angle glaucoma, right eye, mild stage: Secondary | ICD-10-CM | POA: Diagnosis not present

## 2021-03-03 DIAGNOSIS — H524 Presbyopia: Secondary | ICD-10-CM | POA: Diagnosis not present

## 2021-03-05 ENCOUNTER — Encounter: Payer: Self-pay | Admitting: Emergency Medicine

## 2021-03-08 ENCOUNTER — Ambulatory Visit: Payer: Medicare Other | Admitting: Primary Care

## 2021-03-09 ENCOUNTER — Encounter: Payer: Self-pay | Admitting: Family Medicine

## 2021-03-10 ENCOUNTER — Ambulatory Visit (INDEPENDENT_AMBULATORY_CARE_PROVIDER_SITE_OTHER): Payer: Medicare Other

## 2021-03-10 ENCOUNTER — Other Ambulatory Visit: Payer: Self-pay

## 2021-03-10 VITALS — BP 148/89 | HR 53 | Wt 206.0 lb

## 2021-03-10 DIAGNOSIS — J4520 Mild intermittent asthma, uncomplicated: Secondary | ICD-10-CM | POA: Diagnosis not present

## 2021-03-10 DIAGNOSIS — J4540 Moderate persistent asthma, uncomplicated: Secondary | ICD-10-CM

## 2021-03-10 MED ORDER — DIPHENHYDRAMINE HCL 50 MG/ML IJ SOLN: 50.0000 mg | Freq: Once | INTRAMUSCULAR | Status: DC | PRN

## 2021-03-10 MED ORDER — SODIUM CHLORIDE 0.9 % IV SOLN: Freq: Once | INTRAVENOUS | Status: DC | PRN

## 2021-03-10 MED ORDER — EPINEPHRINE 0.3 MG/0.3ML IJ SOAJ: 0.3000 mg | Freq: Once | INTRAMUSCULAR | Status: DC | PRN

## 2021-03-10 MED ORDER — ALBUTEROL SULFATE HFA 108 (90 BASE) MCG/ACT IN AERS: 2.0000 | INHALATION_SPRAY | Freq: Once | RESPIRATORY_TRACT | Status: DC | PRN

## 2021-03-10 MED ORDER — MEPOLIZUMAB 100 MG/ML ~~LOC~~ SOSY
100.0000 mg | PREFILLED_SYRINGE | Freq: Once | SUBCUTANEOUS | Status: AC
  Administered 2021-03-10: 100 mg via SUBCUTANEOUS

## 2021-03-10 MED ORDER — FAMOTIDINE IN NACL 20-0.9 MG/50ML-% IV SOLN: 20.0000 mg | Freq: Once | INTRAVENOUS | Status: DC | PRN

## 2021-03-10 MED ORDER — METHYLPREDNISOLONE SODIUM SUCC 125 MG IJ SOLR: 125.0000 mg | Freq: Once | INTRAMUSCULAR | Status: DC | PRN

## 2021-03-10 NOTE — Progress Notes (Addendum)
Diagnosis: Asthma  Provider:  Praveen Mannam, MD  Procedure: Injection  Nucala (Mepolizumab), Dose: 100 mg, Site: subcutaneous  Discharge: Condition: Good, Destination: Home . AVS provided to patient.   Performed by:  Minard Millirons, RN        

## 2021-03-10 NOTE — Telephone Encounter (Signed)
She is due for follow up. Thanks, BJ

## 2021-03-14 ENCOUNTER — Telehealth (INDEPENDENT_AMBULATORY_CARE_PROVIDER_SITE_OTHER): Payer: Medicare Other | Admitting: Family Medicine

## 2021-03-14 ENCOUNTER — Encounter: Payer: Self-pay | Admitting: Family Medicine

## 2021-03-14 VITALS — Ht 62.0 in

## 2021-03-14 DIAGNOSIS — F331 Major depressive disorder, recurrent, moderate: Secondary | ICD-10-CM | POA: Diagnosis not present

## 2021-03-14 DIAGNOSIS — M25571 Pain in right ankle and joints of right foot: Secondary | ICD-10-CM

## 2021-03-14 MED ORDER — FLUOXETINE HCL 40 MG PO CAPS
40.0000 mg | ORAL_CAPSULE | Freq: Every day | ORAL | 2 refills | Status: DC
Start: 2021-03-14 — End: 2022-02-19

## 2021-03-14 MED ORDER — MELOXICAM 15 MG PO TABS: ORAL_TABLET | ORAL | 1 refills | Status: DC

## 2021-03-14 NOTE — Progress Notes (Signed)
Virtual Visit via Video Note I connected with Ana Hughes on 03/14/21 by a video enabled telemedicine application and verified that I am speaking with the correct person using two identifiers.  Location patient: home Location provider:work office Persons participating in the virtual visit: patient, provider  I discussed the limitations of evaluation and management by telemedicine and the availability of in person appointments. The patient expressed understanding and agreed to proceed.  Chief Complaint  Patient presents with   Medication Refill    Fluoxetine 40mg .    HPI: Ana Hughes is a 70 yo female with hx of DM II,HTN,OA,asthma,and depression requesting refills on Fluoxetine 40 mg. She has been on fluoxetine 40 mg daily for many years for anxiety and depression. She did try to wean herself off medication but started with depressed mood and crying spells again, so she would like to resume it. The anniversary for her husband's death is approaching, this has also aggravated problem. Denies suicidal thoughts. Sleeping about 8 hours.  She takes gabapentin 300 mg daily as needed for lower back pain with radiation to LE's.  She is also requesting refills on Meloxicam 15 mg, which she takes daily as needed for right ankle pain.  She follows with orthopedics as needed. Medication has helped, no side effects. Lab Results  Component Value Date   CREATININE 0.55 08/31/2020   BUN 15 08/31/2020   NA 137 08/31/2020   K 4.5 08/31/2020   CL 102 08/31/2020   CO2 25 08/31/2020   ROS: See pertinent positives and negatives per HPI.  Past Medical History:  Diagnosis Date   Arthritis    Asthma    Depression    Glaucoma    History of colon polyps    Hypertension     Past Surgical History:  Procedure Laterality Date   CESAREAN SECTION  1975, 1977, 1981   REPLACEMENT TOTAL KNEE BILATERAL Bilateral    left knee, 2005, right knee 2004   TOTAL HIP ARTHROPLASTY Bilateral 012, right hip, 2013 left  hip    Family History  Problem Relation Age of Onset   Cancer Mother    Hypertension Mother    Cancer Father    Depression Daughter    Drug abuse Daughter    Depression Daughter    Drug abuse Daughter    Heart attack Daughter    Depression Daughter    Drug abuse Daughter    Mental illness Daughter     Social History   Socioeconomic History   Marital status: Widowed    Spouse name: Not on file   Number of children: 3   Years of education: Not on file   Highest education level: Not on file  Occupational History   Not on file  Tobacco Use   Smoking status: Never   Smokeless tobacco: Never  Vaping Use   Vaping Use: Never used  Substance and Sexual Activity   Alcohol use: Yes   Drug use: Never   Sexual activity: Not Currently  Other Topics Concern   Not on file  Social History Narrative   Not on file   Social Determinants of Health   Financial Resource Strain: Low Risk    Difficulty of Paying Living Expenses: Not hard at all  Food Insecurity: No Food Insecurity   Worried About Running Out of Food in the Last Year: Never true   [place] in the Last Year: Never true  Transportation Needs: No Transportation Needs   Lack of Transportation (Medical):  No   Lack of Transportation (Non-Medical): No  Physical Activity: Inactive   Days of Exercise per Week: 0 days   Minutes of Exercise per Session: 0 min  Stress: No Stress Concern Present   Feeling of Stress : Not at all  Social Connections: Socially Isolated   Frequency of Communication with Friends and Family: Three times a week   Frequency of Social Gatherings with Friends and Family: More than three times a week   Attends Religious Services: Never   Marine scientist or Organizations: No   Attends Archivist Meetings: Never   Marital Status: Widowed  Human resources officer Violence: Not At Risk   Fear of Current or Ex-Partner: No   Emotionally Abused: No   Physically Abused: No   Sexually  Abused: No    Current Outpatient Medications:    amLODipine (NORVASC) 5 MG tablet, Take 1 tablet (5 mg total) by mouth daily., Disp: 90 tablet, Rfl: 3   bimatoprost (LUMIGAN) 0.01 % SOLN, Place 1 drop into both eyes at bedtime., Disp: , Rfl:    dorzolamide (TRUSOPT) 2 % ophthalmic solution, Place 1 drop into both eyes 3 (three) times daily., Disp: , Rfl:    DULERA 200-5 MCG/ACT AERO, USE 2 INHALATIONS TWICE A DAY (MUST HAVE OFFICE VISIT FOR FURTHER REFILLS), Disp: 39 g, Rfl: 3   EPINEPHRINE 0.3 mg/0.3 mL IJ SOAJ injection, INJECT INTO THE MIDDLE OF THE OUTER THIGH AND HOLD FOR 3 SECONDS AS NEEDED FOR SEVERE ALLERGIC REACTION THEN CALL 911 IF USED., Disp: 2 each, Rfl: 5   gabapentin (NEURONTIN) 300 MG capsule, Take 1 capsule (300 mg total) by mouth daily as needed., Disp: 30 capsule, Rfl: 3   montelukast (SINGULAIR) 10 MG tablet, TAKE ONE TABLET BY MOUTH AT BEDTIME, Disp: 30 tablet, Rfl: 3   timolol (TIMOPTIC-XR) 0.25 % ophthalmic gel-forming, Place 1 drop into the left eye daily., Disp: , Rfl:    Vitamin D, Ergocalciferol, (DRISDOL) 1.25 MG (50000 UNIT) CAPS capsule, Take 1 capsule (50,000 Units total) by mouth every 7 (seven) days., Disp: 5 capsule, Rfl: 0   FLUoxetine (PROZAC) 40 MG capsule, Take 1 capsule (40 mg total) by mouth daily., Disp: 90 capsule, Rfl: 2   meloxicam (MOBIC) 15 MG tablet, TAKE ONE TABLET BY MOUTH DAILY AS NEEDED; MAKE DOCTOR'S APPOINTMENT FOR FURTHER REFILLS, Disp: 30 tablet, Rfl: 1  Current Facility-Administered Medications:    Mepolizumab SOLR 100 mg, 100 mg, Subcutaneous, Q28 days, Collene Gobble, MD, 100 mg at 04/27/20 1613  EXAM:  VITALS per patient if applicable:Ht 5\' 2"  (1.575 m)   BMI 37.68 kg/m   GENERAL: alert, oriented, appears well and in no acute distress  HEENT: atraumatic, conjunctiva clear, no obvious abnormalities.  NECK: normal movements of the head and neck  LUNGS: on inspection no signs of respiratory distress, breathing rate appears  normal, no obvious gross SOB, gasping or wheezing  CV: no obvious cyanosis  Ana: moves all visible extremities without noticeable abnormality  PSYCH/NEURO: pleasant and cooperative, no obvious anxiety, + Labile during visit. Speech and thought processing grossly intact  ASSESSMENT AND PLAN:  Discussed the following assessment and plan:  Right ankle pain, unspecified chronicity - Plan: meloxicam (MOBIC) 15 MG tablet Meloxicam has helped, so continue 15 mg daily prn. Some side effects discussed ,including the risk of bleeding with SSRI's.  Monitor BP regularly and we will plan on checking renal function next visit.  Moderate episode of recurrent major depressive disorder ([place]) - Plan:  FLUoxetine (PROZAC) 40 MG capsule Not well controlled. Fluoxetine has helped in the past, so 40 mg to resume.  I discussed the assessment and treatment plan with the patient. Ana Hughes was provided an opportunity to ask questions and all were answered. She agreed with the plan and demonstrated an understanding of the instructions.  Return in about 4 months (around 07/14/2021).   Vito Beg Martinique, MD

## 2021-03-15 NOTE — Progress Notes (Signed)
Patient is scheduled for 06/16/2021 at 8 AM

## 2021-03-16 ENCOUNTER — Ambulatory Visit: Payer: Medicare Other

## 2021-03-22 ENCOUNTER — Other Ambulatory Visit: Payer: Self-pay

## 2021-03-22 MED ORDER — MONTELUKAST SODIUM 10 MG PO TABS: 10.0000 mg | ORAL_TABLET | Freq: Every day | ORAL | 2 refills | Status: DC

## 2021-03-23 NOTE — Telephone Encounter (Signed)
Records shredded since pt did not call back in over a year.

## 2021-04-04 ENCOUNTER — Telehealth: Payer: Self-pay | Admitting: Pharmacy Technician

## 2021-04-04 NOTE — Telephone Encounter (Signed)
(  Fyi) Left v/m with patient. Awaiting call back to inform patient about formulation change of nucala (vials).  Will need to f/u. Patient next scheduled appt is 7/22.

## 2021-04-07 ENCOUNTER — Ambulatory Visit (INDEPENDENT_AMBULATORY_CARE_PROVIDER_SITE_OTHER): Payer: Medicare Other

## 2021-04-07 ENCOUNTER — Other Ambulatory Visit: Payer: Self-pay

## 2021-04-07 VITALS — BP 148/81 | HR 56 | Temp 97.9°F | Resp 18

## 2021-04-07 DIAGNOSIS — J4520 Mild intermittent asthma, uncomplicated: Secondary | ICD-10-CM

## 2021-04-07 DIAGNOSIS — J4540 Moderate persistent asthma, uncomplicated: Secondary | ICD-10-CM

## 2021-04-07 MED ORDER — FAMOTIDINE IN NACL 20-0.9 MG/50ML-% IV SOLN: 20.0000 mg | Freq: Once | INTRAVENOUS | Status: DC | PRN

## 2021-04-07 MED ORDER — SODIUM CHLORIDE 0.9 % IV SOLN: Freq: Once | INTRAVENOUS | Status: DC | PRN

## 2021-04-07 MED ORDER — METHYLPREDNISOLONE SODIUM SUCC 125 MG IJ SOLR: 125.0000 mg | Freq: Once | INTRAMUSCULAR | Status: DC | PRN

## 2021-04-07 MED ORDER — DIPHENHYDRAMINE HCL 50 MG/ML IJ SOLN: 50.0000 mg | Freq: Once | INTRAMUSCULAR | Status: DC | PRN

## 2021-04-07 MED ORDER — EPINEPHRINE 0.3 MG/0.3ML IJ SOAJ: 0.3000 mg | Freq: Once | INTRAMUSCULAR | Status: DC | PRN

## 2021-04-07 MED ORDER — MEPOLIZUMAB 100 MG/ML ~~LOC~~ SOSY: 100.0000 mg | PREFILLED_SYRINGE | Freq: Once | SUBCUTANEOUS | Status: DC

## 2021-04-07 MED ORDER — ALBUTEROL SULFATE HFA 108 (90 BASE) MCG/ACT IN AERS: 2.0000 | INHALATION_SPRAY | Freq: Once | RESPIRATORY_TRACT | Status: DC | PRN

## 2021-04-07 NOTE — Telephone Encounter (Signed)
Patient informed/aware to call 45 min prior to future appt d/t to formulation change of nucala (vials)

## 2021-04-07 NOTE — Progress Notes (Signed)
Diagnosis: Asthma  Provider:  Marshell Garfinkel, MD  Procedure: Injection  Nucala (Mepolizumab), Dose: 100 mg, Site: subcutaneous rt arm   Discharge: Condition: Good, Destination: Home . AVS provided to patient.   Performed by:  Jeidi Gilles, Sherlon Handing, LPN

## 2021-04-13 ENCOUNTER — Ambulatory Visit: Payer: Medicare Other

## 2021-04-18 ENCOUNTER — Other Ambulatory Visit: Payer: Self-pay | Admitting: Family Medicine

## 2021-04-18 DIAGNOSIS — G8929 Other chronic pain: Secondary | ICD-10-CM

## 2021-04-28 DIAGNOSIS — M9901 Segmental and somatic dysfunction of cervical region: Secondary | ICD-10-CM | POA: Diagnosis not present

## 2021-04-28 DIAGNOSIS — M9903 Segmental and somatic dysfunction of lumbar region: Secondary | ICD-10-CM | POA: Diagnosis not present

## 2021-04-28 DIAGNOSIS — M9905 Segmental and somatic dysfunction of pelvic region: Secondary | ICD-10-CM | POA: Diagnosis not present

## 2021-04-28 DIAGNOSIS — M5032 Other cervical disc degeneration, mid-cervical region, unspecified level: Secondary | ICD-10-CM | POA: Diagnosis not present

## 2021-04-28 DIAGNOSIS — M5136 Other intervertebral disc degeneration, lumbar region: Secondary | ICD-10-CM | POA: Diagnosis not present

## 2021-04-28 DIAGNOSIS — M9902 Segmental and somatic dysfunction of thoracic region: Secondary | ICD-10-CM | POA: Diagnosis not present

## 2021-04-28 DIAGNOSIS — M5137 Other intervertebral disc degeneration, lumbosacral region: Secondary | ICD-10-CM | POA: Diagnosis not present

## 2021-04-28 DIAGNOSIS — S29012A Strain of muscle and tendon of back wall of thorax, initial encounter: Secondary | ICD-10-CM | POA: Diagnosis not present

## 2021-05-04 ENCOUNTER — Encounter: Payer: Self-pay | Admitting: Emergency Medicine

## 2021-05-04 ENCOUNTER — Other Ambulatory Visit: Payer: Self-pay

## 2021-05-04 ENCOUNTER — Ambulatory Visit (INDEPENDENT_AMBULATORY_CARE_PROVIDER_SITE_OTHER): Payer: Medicare Other

## 2021-05-04 VITALS — BP 154/85 | HR 54 | Temp 98.4°F | Resp 18

## 2021-05-04 DIAGNOSIS — J4540 Moderate persistent asthma, uncomplicated: Secondary | ICD-10-CM

## 2021-05-04 DIAGNOSIS — J4520 Mild intermittent asthma, uncomplicated: Secondary | ICD-10-CM

## 2021-05-04 MED ORDER — SODIUM CHLORIDE 0.9 % IV SOLN: Freq: Once | INTRAVENOUS | Status: DC | PRN

## 2021-05-04 MED ORDER — ALBUTEROL SULFATE HFA 108 (90 BASE) MCG/ACT IN AERS: 2.0000 | INHALATION_SPRAY | Freq: Once | RESPIRATORY_TRACT | Status: DC | PRN

## 2021-05-04 MED ORDER — FAMOTIDINE IN NACL 20-0.9 MG/50ML-% IV SOLN: 20.0000 mg | Freq: Once | INTRAVENOUS | Status: DC | PRN

## 2021-05-04 MED ORDER — MEPOLIZUMAB 100 MG ~~LOC~~ SOLR
100.0000 mg | Freq: Once | SUBCUTANEOUS | Status: AC
  Administered 2021-05-04: 100 mg via SUBCUTANEOUS
  Filled 2021-05-04: qty 1

## 2021-05-04 MED ORDER — DIPHENHYDRAMINE HCL 50 MG/ML IJ SOLN: 50.0000 mg | Freq: Once | INTRAMUSCULAR | Status: DC | PRN

## 2021-05-04 MED ORDER — EPINEPHRINE 0.3 MG/0.3ML IJ SOAJ: 0.3000 mg | Freq: Once | INTRAMUSCULAR | Status: DC | PRN

## 2021-05-04 MED ORDER — METHYLPREDNISOLONE SODIUM SUCC 125 MG IJ SOLR: 125.0000 mg | Freq: Once | INTRAMUSCULAR | Status: DC | PRN

## 2021-05-04 NOTE — Progress Notes (Signed)
Diagnosis: Asthma  Provider:  Praveen Mannam, MD  Procedure: Injection  Nucala (Mepolizumab), Dose: 100 mg, Site: subcutaneous  Discharge: Condition: Good, Destination: Home . AVS provided to patient.   Performed by:  Mckell Riecke, RN       

## 2021-05-05 ENCOUNTER — Ambulatory Visit: Payer: Medicare Other

## 2021-05-10 ENCOUNTER — Encounter: Payer: Self-pay | Admitting: Emergency Medicine

## 2021-05-11 ENCOUNTER — Ambulatory Visit: Payer: Medicare Other

## 2021-05-12 DIAGNOSIS — M19271 Secondary osteoarthritis, right ankle and foot: Secondary | ICD-10-CM | POA: Diagnosis not present

## 2021-05-12 DIAGNOSIS — M25571 Pain in right ankle and joints of right foot: Secondary | ICD-10-CM | POA: Diagnosis not present

## 2021-05-19 DIAGNOSIS — H34812 Central retinal vein occlusion, left eye, with macular edema: Secondary | ICD-10-CM | POA: Diagnosis not present

## 2021-05-22 ENCOUNTER — Encounter: Payer: Self-pay | Admitting: Family Medicine

## 2021-05-24 ENCOUNTER — Telehealth: Payer: Self-pay | Admitting: Emergency Medicine

## 2021-05-24 NOTE — Telephone Encounter (Signed)
Spoke with the pt  She is filling out paperwork for her eye appt  Needs to know dx I advised we are seeing her for asthma  Nothing further needed

## 2021-05-25 ENCOUNTER — Encounter (INDEPENDENT_AMBULATORY_CARE_PROVIDER_SITE_OTHER): Payer: Medicare Other | Admitting: Ophthalmology

## 2021-05-25 DIAGNOSIS — H35371 Puckering of macula, right eye: Secondary | ICD-10-CM | POA: Diagnosis not present

## 2021-05-25 DIAGNOSIS — H43813 Vitreous degeneration, bilateral: Secondary | ICD-10-CM | POA: Diagnosis not present

## 2021-05-25 DIAGNOSIS — H3562 Retinal hemorrhage, left eye: Secondary | ICD-10-CM | POA: Diagnosis not present

## 2021-05-25 DIAGNOSIS — H34812 Central retinal vein occlusion, left eye, with macular edema: Secondary | ICD-10-CM | POA: Diagnosis not present

## 2021-05-28 ENCOUNTER — Other Ambulatory Visit: Payer: Self-pay | Admitting: Family Medicine

## 2021-05-28 DIAGNOSIS — M25571 Pain in right ankle and joints of right foot: Secondary | ICD-10-CM

## 2021-05-29 ENCOUNTER — Telehealth: Payer: Self-pay | Admitting: Family Medicine

## 2021-05-29 DIAGNOSIS — E1169 Type 2 diabetes mellitus with other specified complication: Secondary | ICD-10-CM

## 2021-05-29 DIAGNOSIS — E785 Hyperlipidemia, unspecified: Secondary | ICD-10-CM

## 2021-05-29 NOTE — Telephone Encounter (Signed)
Orders are in, please schedule lab appt. Thank you!

## 2021-05-29 NOTE — Telephone Encounter (Signed)
Patient called about having lab work done before appointment so she can speak with Dr.Jordan about results at the appointment scheduled on 9/30. Patient will need to be scheduled for separate lab appointment once orders are in.     Good callback number is [phone]    Please Advise

## 2021-05-29 NOTE — Telephone Encounter (Signed)
Okay to order labs before appt?

## 2021-06-01 ENCOUNTER — Other Ambulatory Visit: Payer: Self-pay

## 2021-06-01 ENCOUNTER — Ambulatory Visit (INDEPENDENT_AMBULATORY_CARE_PROVIDER_SITE_OTHER): Payer: Medicare Other

## 2021-06-01 VITALS — BP 130/80 | HR 59 | Temp 98.7°F | Resp 16

## 2021-06-01 DIAGNOSIS — J4540 Moderate persistent asthma, uncomplicated: Secondary | ICD-10-CM | POA: Diagnosis not present

## 2021-06-01 DIAGNOSIS — J4520 Mild intermittent asthma, uncomplicated: Secondary | ICD-10-CM

## 2021-06-01 MED ORDER — MEPOLIZUMAB 100 MG ~~LOC~~ SOLR
100.0000 mg | Freq: Once | SUBCUTANEOUS | Status: AC
  Administered 2021-06-01: 100 mg via SUBCUTANEOUS
  Filled 2021-06-01: qty 1

## 2021-06-01 MED ORDER — SODIUM CHLORIDE 0.9 % IV SOLN: Freq: Once | INTRAVENOUS | Status: DC | PRN

## 2021-06-01 MED ORDER — EPINEPHRINE 0.3 MG/0.3ML IJ SOAJ: 0.3000 mg | Freq: Once | INTRAMUSCULAR | Status: DC | PRN

## 2021-06-01 MED ORDER — METHYLPREDNISOLONE SODIUM SUCC 125 MG IJ SOLR: 125.0000 mg | Freq: Once | INTRAMUSCULAR | Status: DC | PRN

## 2021-06-01 MED ORDER — DIPHENHYDRAMINE HCL 50 MG/ML IJ SOLN: 50.0000 mg | Freq: Once | INTRAMUSCULAR | Status: DC | PRN

## 2021-06-01 MED ORDER — ALBUTEROL SULFATE HFA 108 (90 BASE) MCG/ACT IN AERS: 2.0000 | INHALATION_SPRAY | Freq: Once | RESPIRATORY_TRACT | Status: DC | PRN

## 2021-06-01 MED ORDER — FAMOTIDINE IN NACL 20-0.9 MG/50ML-% IV SOLN: 20.0000 mg | Freq: Once | INTRAVENOUS | Status: DC | PRN

## 2021-06-01 NOTE — Progress Notes (Signed)
Diagnosis: Asthma  Provider:  Praveen Mannam, MD  Procedure: Injection  Nucala (Mepolizumab), Dose: 100 mg, Site: subcutaneous  Discharge: Condition: Good, Destination: Home . AVS provided to patient.   Performed by:  Oma Marzan, RN       

## 2021-06-09 ENCOUNTER — Other Ambulatory Visit (INDEPENDENT_AMBULATORY_CARE_PROVIDER_SITE_OTHER): Payer: Medicare Other

## 2021-06-09 ENCOUNTER — Encounter: Payer: Self-pay | Admitting: Emergency Medicine

## 2021-06-09 ENCOUNTER — Other Ambulatory Visit: Payer: Self-pay

## 2021-06-09 DIAGNOSIS — E1169 Type 2 diabetes mellitus with other specified complication: Secondary | ICD-10-CM

## 2021-06-09 DIAGNOSIS — E785 Hyperlipidemia, unspecified: Secondary | ICD-10-CM | POA: Diagnosis not present

## 2021-06-09 LAB — BASIC METABOLIC PANEL
BUN: 20 mg/dL (ref 6–23)
CO2: 28 mEq/L (ref 19–32)
Calcium: 9.1 mg/dL (ref 8.4–10.5)
Chloride: 103 mEq/L (ref 96–112)
Creatinine, Ser: 0.67 mg/dL (ref 0.40–1.20)
GFR: 89.72 mL/min (ref >60.00)
Glucose, Bld: 92 mg/dL (ref 70–99)
Potassium: 4.1 mEq/L (ref 3.5–5.1)
Sodium: 137 mEq/L (ref 135–145)

## 2021-06-09 LAB — HEMOGLOBIN A1C: Hgb A1c MFr Bld: 5.4 % (ref 4.6–6.5)

## 2021-06-09 LAB — LIPID PANEL
Cholesterol: 239 mg/dL — ABNORMAL HIGH (ref 0–200)
HDL: 69.8 mg/dL (ref >39.00)
LDL Cholesterol: 154 mg/dL — ABNORMAL HIGH (ref 0–99)
NonHDL: 168.98
Total CHOL/HDL Ratio: 3
Triglycerides: 74 mg/dL (ref 0.0–149.0)
VLDL: 14.8 mg/dL (ref 0.0–40.0)

## 2021-06-11 NOTE — Progress Notes (Deleted)
HPI:  [name] is a 70 y.o. female, who is here today for *** months follow up.   *** was last seen on ***  Hyperlipidemia: Currently on *** Following a low fat diet: ***. Side effects from medication:*** Lab Results  Component Value Date   CHOL 239 (H) 06/09/2021   HDL 69.80 06/09/2021   LDLCALC 154 (H) 06/09/2021   TRIG 74.0 06/09/2021   CHOLHDL 3 06/09/2021    Hypertension:  Medications:*** BP readings at home:*** Side effects:***  Negative for unusual or severe headache, visual changes, exertional chest pain, dyspnea,  focal weakness, or edema.  Lab Results  Component Value Date   CREATININE 0.67 06/09/2021   BUN 20 06/09/2021   NA 137 06/09/2021   K 4.1 06/09/2021   CL 103 06/09/2021   CO2 28 06/09/2021     {4+ HPI elements (or status of 3 or more chronic diseases)} ***   Review of Systems Rest of ROS, see pertinent positives sand negatives in HPI [review of 2 to 9 systems] ***  Current Outpatient Medications on File Prior to Visit  Medication Sig Dispense Refill   amLODipine (NORVASC) 5 MG tablet Take 1 tablet (5 mg total) by mouth daily. 90 tablet 3   bimatoprost (LUMIGAN) 0.01 % SOLN Place 1 drop into both eyes at bedtime.     dorzolamide (TRUSOPT) 2 % ophthalmic solution Place 1 drop into both eyes 3 (three) times daily.     DULERA 200-5 MCG/ACT AERO USE 2 INHALATIONS TWICE A DAY (MUST HAVE OFFICE VISIT FOR FURTHER REFILLS) 39 g 3   FLUoxetine (PROZAC) 40 MG capsule Take 1 capsule (40 mg total) by mouth daily. 90 capsule 2   gabapentin (NEURONTIN) 300 MG capsule TAKE ONE CAPSULE BY MOUTH DAILY AS NEEDED 30 capsule 3   meloxicam (MOBIC) 15 MG tablet TAKE ONE TABLET BY MOUTH DAILY AS NEEDED; 30 tablet 1   montelukast (SINGULAIR) 10 MG tablet Take 1 tablet (10 mg total) by mouth at bedtime. 30 tablet 2   timolol (TIMOPTIC-XR) 0.25 % ophthalmic gel-forming Place 1 drop into the left eye daily.     Vitamin D, Ergocalciferol, (DRISDOL)  1.25 MG (50000 UNIT) CAPS capsule Take 1 capsule (50,000 Units total) by mouth every 7 (seven) days. 5 capsule 0   Current Facility-Administered Medications on File Prior to Visit  Medication Dose Route Frequency Provider Last Rate Last Admin   Mepolizumab SOLR 100 mg  100 mg Subcutaneous Q28 days Collene Gobble, MD   100 mg at 04/07/21 1140     Past Medical History:  Diagnosis Date   Arthritis    Asthma    Depression    Glaucoma    History of colon polyps    Hypertension    Allergies  Allergen Reactions   Simbrinza [Brinzolamide-Brimonidine]     Social History   Socioeconomic History   Marital status: Widowed    Spouse name: Not on file   Number of children: 3   Years of education: Not on file   Highest education level: Not on file  Occupational History   Not on file  Tobacco Use   Smoking status: Never   Smokeless tobacco: Never  Vaping Use   Vaping Use: Never used  Substance and Sexual Activity   Alcohol use: Yes   Drug use: Never   Sexual activity: Not Currently  Other Topics Concern   Not on file  Social History Narrative   Not on  file   Social Determinants of Health   Financial Resource Strain: Low Risk    Difficulty of Paying Living Expenses: Not hard at all  Food Insecurity: No Food Insecurity   Worried About Charity fundraiser in the Last Year: Never true   Arboriculturist in the Last Year: Never true  Transportation Needs: No Transportation Needs   Lack of Transportation (Medical): No   Lack of Transportation (Non-Medical): No  Physical Activity: Inactive   Days of Exercise per Week: 0 days   Minutes of Exercise per Session: 0 min  Stress: No Stress Concern Present   Feeling of Stress : Not at all  Social Connections: Socially Isolated   Frequency of Communication with Friends and Family: Three times a week   Frequency of Social Gatherings with Friends and Family: More than three times a week   Attends Religious Services: Never   Building surveyor or Organizations: No   Attends Archivist Meetings: Never   Marital Status: Widowed    There were no vitals filed for this visit. There is no height or weight on file to calculate BMI.   Physical Exam  {[12+ exam elements]} ***  ASSESSMENT AND PLAN:  [name] was seen today for *** months follow-up.  No orders of the defined types were placed in this encounter.   No problem-specific Assessment & Plan notes found for this encounter.    No follow-ups on file.   Ronnisha Felber G. Martinique, MD  [facility]. [place] office.

## 2021-06-16 ENCOUNTER — Ambulatory Visit: Payer: Medicare Other | Admitting: Family Medicine

## 2021-06-16 DIAGNOSIS — E785 Hyperlipidemia, unspecified: Secondary | ICD-10-CM

## 2021-06-16 DIAGNOSIS — I10 Essential (primary) hypertension: Secondary | ICD-10-CM

## 2021-06-19 NOTE — Progress Notes (Signed)
HPI: Ana Hughes is a 70 y.o. female, who is here today for follow up.   She was last seen on 03/14/2021 due to right ankle pain.  Since her last visit she has followed with orthopedics, diagnosed with osteoarthritis. She is on meloxicam 15 mg daily as needed, which helps with pain. She has tolerated medication well.  Hypertension:  Medications: Amlodipine 5 mg daily. Medication has been well-tolerated.  Negative for unusual or severe headache, exertional chest pain, dyspnea,  focal weakness, or edema.  Lab Results  Component Value Date   CREATININE 0.67 06/09/2021   BUN 20 06/09/2021   NA 137 06/09/2021   K 4.1 06/09/2021   CL 103 06/09/2021   CO2 28 06/09/2021   Left iron retinopathy.  She has seen retinal specialist and has received intraocular injections in left eye. She has noted improvement in vision.  No history of diabetes. In 08/2020 a A1c was done at home, [facility] nurse visit, it was 9.6.  Lab was repeated here in the office on 08/31/2020 and it was in normal range, 5.4. Negative for polydipsia, polyuria, polyphagia.  Lab Results  Component Value Date   HGBA1C 5.4 06/09/2021   Lab Results  Component Value Date   MICROALBUR 0.7 08/31/2020   Hyperlipidemia: Currently on nonpharmacologic treatment. Following a low fat diet: She is trying to eat healthier. She eats fried food sometime.  Lab Results  Component Value Date   CHOL 239 (H) 06/09/2021   HDL 69.80 06/09/2021   LDLCALC 154 (H) 06/09/2021   TRIG 74.0 06/09/2021   CHOLHDL 3 06/09/2021   Vitamin D deficiency: Currently she is on OTC vitamin D supplementation, she does not remember the dose. Last 25 OH vitamin D done in 04/2019 was 49.4.  She would like to have her throat checked. She saw her dentist recently and she was recommended to follow-up with PCP because of "lesions" on her throat. She is not having sore throat, dysphagia, or dysphonia. She usually rinses her mouth after using  Dulera. In the past she has been treated for thrush.  Review of Systems  Constitutional:  Negative for activity change, appetite change and fever.  HENT:  Negative for mouth sores and nosebleeds.   Respiratory:  Negative for cough and wheezing.   Gastrointestinal:  Negative for abdominal pain, nausea and vomiting.       Negative for changes in bowel habits.  Genitourinary:  Negative for decreased urine volume and hematuria.  Musculoskeletal:  Positive for arthralgias and back pain.  Neurological:  Negative for syncope, facial asymmetry and weakness.  Rest of ROS, see pertinent positives sand negatives in HPI  Current Outpatient Medications on File Prior to Visit  Medication Sig Dispense Refill   Aflibercept (EYLEA) 2 MG/0.05ML SOSY by Intravitreal route. Given by eye doctor.     amLODipine (NORVASC) 5 MG tablet Take 1 tablet (5 mg total) by mouth daily. 90 tablet 3   bimatoprost (LUMIGAN) 0.01 % SOLN Place 1 drop into both eyes at bedtime.     dorzolamide (TRUSOPT) 2 % ophthalmic solution Place 1 drop into both eyes 3 (three) times daily.     DULERA 200-5 MCG/ACT AERO USE 2 INHALATIONS TWICE A DAY (MUST HAVE OFFICE VISIT FOR FURTHER REFILLS) 39 g 3   FLUoxetine (PROZAC) 40 MG capsule Take 1 capsule (40 mg total) by mouth daily. 90 capsule 2   gabapentin (NEURONTIN) 300 MG capsule TAKE ONE CAPSULE BY MOUTH DAILY AS NEEDED 30 capsule 3  meloxicam (MOBIC) 15 MG tablet TAKE ONE TABLET BY MOUTH DAILY AS NEEDED; 30 tablet 1   montelukast (SINGULAIR) 10 MG tablet Take 1 tablet (10 mg total) by mouth at bedtime. 30 tablet 2   timolol (TIMOPTIC-XR) 0.25 % ophthalmic gel-forming Place 1 drop into the left eye daily.     Current Facility-Administered Medications on File Prior to Visit  Medication Dose Route Frequency Provider Last Rate Last Admin   Mepolizumab SOLR 100 mg  100 mg Subcutaneous Q28 days Collene Gobble, MD   100 mg at 04/07/21 1140     Past Medical History:  Diagnosis Date    Arthritis    Asthma    Depression    Glaucoma    History of colon polyps    Hypertension    Allergies  Allergen Reactions   Simbrinza [Brinzolamide-Brimonidine]     Social History   Socioeconomic History   Marital status: Widowed    Spouse name: Not on file   Number of children: 3   Years of education: Not on file   Highest education level: Not on file  Occupational History   Not on file  Tobacco Use   Smoking status: Never   Smokeless tobacco: Never  Vaping Use   Vaping Use: Never used  Substance and Sexual Activity   Alcohol use: Yes   Drug use: Never   Sexual activity: Not Currently  Other Topics Concern   Not on file  Social History Narrative   Not on file   Social Determinants of Health   Financial Resource Strain: Low Risk    Difficulty of Paying Living Expenses: Not hard at all  Food Insecurity: No Food Insecurity   Worried About Charity fundraiser in the Last Year: Never true   [place] in the Last Year: Never true  Transportation Needs: No Transportation Needs   Lack of Transportation (Medical): No   Lack of Transportation (Non-Medical): No  Physical Activity: Inactive   Days of Exercise per Week: 0 days   Minutes of Exercise per Session: 0 min  Stress: No Stress Concern Present   Feeling of Stress : Not at all  Social Connections: Socially Isolated   Frequency of Communication with Friends and Family: Three times a week   Frequency of Social Gatherings with Friends and Family: More than three times a week   Attends Religious Services: Never   Marine scientist or Organizations: No   Attends Archivist Meetings: Never   Marital Status: Widowed   Vitals:   06/20/21 1202  BP: 128/80  Pulse: (!) 59  Resp: 16  SpO2: 98%   Wt Readings from Last 3 Encounters:  06/20/21 205 lb 8 oz (93.2 kg)  03/10/21 206 lb (93.4 kg)  12/14/20 206 lb 9 oz (93.7 kg)   Body mass index is 37.59 kg/m.  Physical Exam Vitals and nursing  note reviewed.  Constitutional:      General: She is not in acute distress.    Appearance: She is well-developed.  HENT:     Head: Normocephalic and atraumatic.     Mouth/Throat:     Mouth: Mucous membranes are moist.     Pharynx: Oropharynx is clear. Posterior oropharyngeal erythema present. No uvula swelling.     Comments: Whitish punctuate areas on soft palate and mild erythema. Eyes:     Conjunctiva/sclera: Conjunctivae normal.  Cardiovascular:     Rate and Rhythm: Regular rhythm. Bradycardia present.  Pulses:          Dorsalis pedis pulses are 2+ on the right side and 2+ on the left side.     Heart sounds: No murmur heard. Pulmonary:     Effort: Pulmonary effort is normal. No respiratory distress.     Breath sounds: Normal breath sounds.  Abdominal:     Palpations: Abdomen is soft. There is no hepatomegaly or mass.     Tenderness: There is no abdominal tenderness.  Musculoskeletal:     Comments: Right ankle brace in place.  Lymphadenopathy:     Cervical: No cervical adenopathy.  Skin:    General: Skin is warm.     Findings: No erythema or rash.  Neurological:     General: No focal deficit present.     Mental Status: She is alert and oriented to person, place, and time.     Cranial Nerves: No cranial nerve deficit.     Gait: Gait normal.  Psychiatric:     Comments: Well groomed, good eye contact.   ASSESSMENT AND PLAN:  Ms. Emilianna Barlowe was seen today for follow-up.  Orders Placed This Encounter  Procedures   Flu Vaccine QUAD High Dose(Fluad)   Vitamin D deficiency, unspecified Continue current dose of vitamin D supplementation. We will plan on checking 25 OH vitamin D levels next visit.  Morbid obesity ([place]) Weight has been stable. Consistency with healthy diet and physical activity encouraged.   Localized osteoarthritis of right ankle Continue meloxicam 15 mg daily as needed. We discussed some side effects of NSAIDs. Weight loss will  help.  Hyperlipidemia, unspecified Numbers have improved, LDL still not at goal. She prefers to continue working on low-fat diet, so we will hold on pharmacologic treatment for now.  Hypertension, essential, benign BP adequately controlled. Continue amlodipine 5 mg daily. Continue low-salt diet. Eye exam is current.  Oral candidiasis We discussed some side effects of ICS medication. Continue swishing after 2 L used. Using a spacer with inhaler may help with prevention. Nystatin 3 times daily for 10 days recommended, repeat if needed.  - nystatin (MYCOSTATIN) 100000 UNIT/ML suspension; Take 5 mLs (500,000 Units total) by mouth 3 (three) times daily.  Dispense: 473 mL; Refill: 1  Need for influenza vaccination - Flu Vaccine QUAD High Dose(Fluad)  I spent a total of 42 minutes in both face to face and non face to face activities for this visit on the date of this encounter. During this time history was obtained and documented, examination was performed, prior labs reviewed, and assessment/plan discussed.  Return in about 6 months (around 12/19/2021) for cpe.   Taeler Winning G. Martinique, MD  [facility]. [place] office.

## 2021-06-20 ENCOUNTER — Ambulatory Visit (INDEPENDENT_AMBULATORY_CARE_PROVIDER_SITE_OTHER): Payer: Medicare Other | Admitting: Family Medicine

## 2021-06-20 ENCOUNTER — Encounter: Payer: Self-pay | Admitting: Family Medicine

## 2021-06-20 ENCOUNTER — Other Ambulatory Visit: Payer: Self-pay

## 2021-06-20 VITALS — BP 128/80 | HR 59 | Resp 16 | Ht 62.0 in | Wt 205.5 lb

## 2021-06-20 DIAGNOSIS — E785 Hyperlipidemia, unspecified: Secondary | ICD-10-CM

## 2021-06-20 DIAGNOSIS — M5136 Other intervertebral disc degeneration, lumbar region: Secondary | ICD-10-CM | POA: Diagnosis not present

## 2021-06-20 DIAGNOSIS — S29012A Strain of muscle and tendon of back wall of thorax, initial encounter: Secondary | ICD-10-CM | POA: Diagnosis not present

## 2021-06-20 DIAGNOSIS — I10 Essential (primary) hypertension: Secondary | ICD-10-CM

## 2021-06-20 DIAGNOSIS — M9902 Segmental and somatic dysfunction of thoracic region: Secondary | ICD-10-CM | POA: Diagnosis not present

## 2021-06-20 DIAGNOSIS — M19071 Primary osteoarthritis, right ankle and foot: Secondary | ICD-10-CM | POA: Diagnosis not present

## 2021-06-20 DIAGNOSIS — M5032 Other cervical disc degeneration, mid-cervical region, unspecified level: Secondary | ICD-10-CM | POA: Diagnosis not present

## 2021-06-20 DIAGNOSIS — B370 Candidal stomatitis: Secondary | ICD-10-CM

## 2021-06-20 DIAGNOSIS — E559 Vitamin D deficiency, unspecified: Secondary | ICD-10-CM

## 2021-06-20 DIAGNOSIS — Z23 Encounter for immunization: Secondary | ICD-10-CM | POA: Diagnosis not present

## 2021-06-20 DIAGNOSIS — M9903 Segmental and somatic dysfunction of lumbar region: Secondary | ICD-10-CM | POA: Diagnosis not present

## 2021-06-20 DIAGNOSIS — M9901 Segmental and somatic dysfunction of cervical region: Secondary | ICD-10-CM | POA: Diagnosis not present

## 2021-06-20 DIAGNOSIS — E1169 Type 2 diabetes mellitus with other specified complication: Secondary | ICD-10-CM

## 2021-06-20 DIAGNOSIS — M9905 Segmental and somatic dysfunction of pelvic region: Secondary | ICD-10-CM | POA: Diagnosis not present

## 2021-06-20 DIAGNOSIS — M5137 Other intervertebral disc degeneration, lumbosacral region: Secondary | ICD-10-CM | POA: Diagnosis not present

## 2021-06-20 MED ORDER — NYSTATIN 100000 UNIT/ML MT SUSP: 5.0000 mL | Freq: Three times a day (TID) | OROMUCOSAL | 1 refills | Status: AC

## 2021-06-20 NOTE — Assessment & Plan Note (Signed)
Continue current dose of vitamin D supplementation. We will plan on checking 25 OH vitamin D levels next visit.

## 2021-06-20 NOTE — Assessment & Plan Note (Signed)
Weight has been stable. Consistency with healthy diet and physical activity encouraged.

## 2021-06-20 NOTE — Assessment & Plan Note (Signed)
BP adequately controlled. Continue amlodipine 5 mg daily. Continue low-salt diet. Eye exam is current.

## 2021-06-20 NOTE — Assessment & Plan Note (Signed)
Numbers have improved, LDL still not at goal. She prefers to continue working on low-fat diet, so we will hold on pharmacologic treatment for now.

## 2021-06-20 NOTE — Assessment & Plan Note (Signed)
Continue meloxicam 15 mg daily as needed. We discussed some side effects of NSAIDs. Weight loss will help.

## 2021-06-20 NOTE — Patient Instructions (Addendum)
A few things to remember from today's visit:   Hyperlipidemia, unspecified hyperlipidemia type  Hypertension, essential, benign  Localized osteoarthritis of right ankle  Oral candidiasis - Plan: nystatin (MYCOSTATIN) 100000 UNIT/ML suspension  If you need refills please call your pharmacy. Do not use My Chart to request refills or for acute issues that need immediate attention. Continue working on low fat diet.    Please be sure medication list is accurate. If a new problem present, please set up appointment sooner than planned today.

## 2021-06-22 ENCOUNTER — Ambulatory Visit (INDEPENDENT_AMBULATORY_CARE_PROVIDER_SITE_OTHER): Payer: Medicare Other | Admitting: Emergency Medicine

## 2021-06-22 ENCOUNTER — Encounter: Payer: Self-pay | Admitting: Emergency Medicine

## 2021-06-22 ENCOUNTER — Other Ambulatory Visit: Payer: Self-pay

## 2021-06-22 DIAGNOSIS — B370 Candidal stomatitis: Secondary | ICD-10-CM | POA: Diagnosis not present

## 2021-06-22 DIAGNOSIS — J4520 Mild intermittent asthma, uncomplicated: Secondary | ICD-10-CM | POA: Diagnosis not present

## 2021-06-22 DIAGNOSIS — H3562 Retinal hemorrhage, left eye: Secondary | ICD-10-CM | POA: Diagnosis not present

## 2021-06-22 DIAGNOSIS — H35371 Puckering of macula, right eye: Secondary | ICD-10-CM | POA: Diagnosis not present

## 2021-06-22 DIAGNOSIS — H34812 Central retinal vein occlusion, left eye, with macular edema: Secondary | ICD-10-CM | POA: Diagnosis not present

## 2021-06-22 DIAGNOSIS — H43813 Vitreous degeneration, bilateral: Secondary | ICD-10-CM | POA: Diagnosis not present

## 2021-06-22 NOTE — Patient Instructions (Addendum)
Continue your Nucala as you have been taking it. Continue Dulera 2 puffs twice a day.  Rinse and gargle after using.  We will refill this for you today. Keep your albuterol available use 2 puffs when needed for shortness of breath, chest tightness, wheezing. Continue Singulair 10 mg each evening Take the nystatin mouthwash as prescribed. Flu shot up-to-date Recommend that you get the COVID-19 booster this fall Follow with Dr. Lamonte Sakai in 12 months or sooner if you have any problems.

## 2021-06-22 NOTE — Assessment & Plan Note (Signed)
There is some white discoloration in the OP although difficult to say whether this is thrush - asymptomatic and no real coating. Agree with empiric treatment nystatin S&S

## 2021-06-22 NOTE — Assessment & Plan Note (Signed)
Continue your Nucala as you have been taking it. Continue Dulera 2 puffs twice a day.  Rinse and gargle after using.  We will refill this for you today. Keep your albuterol available use 2 puffs when needed for shortness of breath, chest tightness, wheezing. Continue Singulair 10 mg each evening Flu shot up-to-date Recommend that you get the COVID-19 booster this fall Follow with Dr. Lamonte Sakai in 12 months or sooner if you have any problems.

## 2021-06-22 NOTE — Progress Notes (Signed)
Subjective:    Patient ID: Ana Hughes, female    DOB: 06/12/1951, 70 y.o.   MRN: [id number]  Asthma There is no cough, shortness of breath or wheezing. Pertinent negatives include no ear pain, fever, headaches, postnasal drip, rhinorrhea, sneezing, sore throat or trouble swallowing. Her past medical history is significant for asthma.   ROV 04/27/20 --70 year old woman with a history of moderate persistent asthma as well as upper airway irritation syndrome, allergic rhinitis.  She is on [place].  Started Singulair 06/2019. She has not flared in the last year. She has been doing fairly well.  She has had some scratchy throat and some daytime cough. She had some sneezing this am. She has albuterol, uses rarely, last time was months ago. Good functional capacity.   ROV 06/22/21 --visit for 70 year old woman with a history of asthma and upper airway irritation syndrome, allergic rhinitis.  She has been maintained on [place], Dulera and Singulair. Today she reports that  she has been doing very well. She never needs albuterol. No flares since last time. Nucala on a schedule - good supply. She is dealing with thrush, just diagnosed, is about to start nystatin.  Flu shot up to date.  Will get the COVID booster this Fall.    Review of Systems  Constitutional:  Negative for fever and unexpected weight change.  HENT:  Negative for congestion, dental problem, ear pain, nosebleeds, postnasal drip, rhinorrhea, sinus pressure, sneezing, sore throat and trouble swallowing.   Eyes:  Negative for redness and itching.  Respiratory:  Negative for cough, chest tightness, shortness of breath and wheezing.   Cardiovascular:  Negative for palpitations and leg swelling.  Gastrointestinal:  Negative for nausea and vomiting.  Genitourinary:  Negative for dysuria.  Musculoskeletal:  Negative for joint swelling.  Skin:  Negative for rash.  Neurological:  Negative for headaches.  Hematological:  Does not  bruise/bleed easily.  Psychiatric/Behavioral:  Negative for dysphoric mood. The patient is not nervous/anxious.   Past Medical History:  Diagnosis Date   Arthritis    Asthma    Depression    Glaucoma    History of colon polyps    Hypertension      Family History  Problem Relation Age of Onset   Cancer Mother    Hypertension Mother    Cancer Father    Depression Daughter    Drug abuse Daughter    Depression Daughter    Drug abuse Daughter    Heart attack Daughter    Depression Daughter    Drug abuse Daughter    Mental illness Daughter      Social History   Socioeconomic History   Marital status: Widowed    Spouse name: Not on file   Number of children: 3   Years of education: Not on file   Highest education level: Not on file  Occupational History   Not on file  Tobacco Use   Smoking status: Never   Smokeless tobacco: Never  Vaping Use   Vaping Use: Never used  Substance and Sexual Activity   Alcohol use: Yes   Drug use: Never   Sexual activity: Not Currently  Other Topics Concern   Not on file  Social History Narrative   Not on file   Social Determinants of Health   Financial Resource Strain: Low Risk    Difficulty of Paying Living Expenses: Not hard at all  Food Insecurity: No Food Insecurity   Worried About Estate manager/land agent of Food  in the Last Year: Never true   Worth in the Last Year: Never true  Transportation Needs: No Transportation Needs   Lack of Transportation (Medical): No   Lack of Transportation (Non-Medical): No  Physical Activity: Inactive   Days of Exercise per Week: 0 days   Minutes of Exercise per Session: 0 min  Stress: No Stress Concern Present   Feeling of Stress : Not at all  Social Connections: Socially Isolated   Frequency of Communication with Friends and Family: Three times a week   Frequency of Social Gatherings with Friends and Family: More than three times a week   Attends Religious Services: Never   Museum/gallery conservator or Organizations: No   Attends Archivist Meetings: Never   Marital Status: Widowed  Human resources officer Violence: Not At Risk   Fear of Current or Ex-Partner: No   Emotionally Abused: No   Physically Abused: No   Sexually Abused: No     Allergies  Allergen Reactions   Simbrinza [Brinzolamide-Brimonidine]      Outpatient Medications Prior to Visit  Medication Sig Dispense Refill   Aflibercept (EYLEA) 2 MG/0.05ML SOSY by Intravitreal route. Given by eye doctor.     amLODipine (NORVASC) 5 MG tablet Take 1 tablet (5 mg total) by mouth daily. 90 tablet 3   bimatoprost (LUMIGAN) 0.01 % SOLN Place 1 drop into both eyes at bedtime.     dorzolamide (TRUSOPT) 2 % ophthalmic solution Place 1 drop into both eyes 3 (three) times daily.     DULERA 200-5 MCG/ACT AERO USE 2 INHALATIONS TWICE A DAY (MUST HAVE OFFICE VISIT FOR FURTHER REFILLS) 39 g 3   FLUoxetine (PROZAC) 40 MG capsule Take 1 capsule (40 mg total) by mouth daily. 90 capsule 2   gabapentin (NEURONTIN) 300 MG capsule TAKE ONE CAPSULE BY MOUTH DAILY AS NEEDED 30 capsule 3   meloxicam (MOBIC) 15 MG tablet TAKE ONE TABLET BY MOUTH DAILY AS NEEDED; 30 tablet 1   montelukast (SINGULAIR) 10 MG tablet Take 1 tablet (10 mg total) by mouth at bedtime. 30 tablet 2   nystatin (MYCOSTATIN) 100000 UNIT/ML suspension Take 5 mLs (500,000 Units total) by mouth 3 (three) times daily. 473 mL 1   timolol (TIMOPTIC-XR) 0.25 % ophthalmic gel-forming Place 1 drop into the left eye daily.     Facility-Administered Medications Prior to Visit  Medication Dose Route Frequency Provider Last Rate Last Admin   Mepolizumab SOLR 100 mg  100 mg Subcutaneous Q28 days Collene Gobble, MD   100 mg at 04/07/21 1140        Objective:   Physical Exam Vitals:   06/22/21 1018  BP: 132/80  Pulse: 63  Temp: 97.6 F (36.4 C)  TempSrc: Oral  SpO2: 95%  Weight: 205 lb 12.8 oz (93.4 kg)  Height: 5\' 2"  (1.575 m)   Gen: Pleasant, overwt woman, in no  distress,  normal affect  ENT: No lesions,  mouth clear, some very pale, tongue mucosa, some white areas but unclear that there is any coating.  No postnasal drip  Neck: No JVD, no stridor  Lungs: No use of accessory muscles, clear B, no wheezing  Cardiovascular: RRR, heart sounds normal, no murmur or gallops, trace peripheral edema  Musculoskeletal: No deformities, no cyanosis or clubbing  Neuro: alert, non focal  Skin: Warm, no lesions or rash     Assessment & Plan:  Asthma Continue your Nucala as you have been taking it.  Continue Dulera 2 puffs twice a day.  Rinse and gargle after using.  We will refill this for you today. Keep your albuterol available use 2 puffs when needed for shortness of breath, chest tightness, wheezing. Continue Singulair 10 mg each evening Flu shot up-to-date Recommend that you get the COVID-19 booster this fall Follow with Dr. Lamonte Sakai in 12 months or sooner if you have any problems.   Oral candidiasis There is some white discoloration in the OP although difficult to say whether this is thrush - asymptomatic and no real coating. Agree with empiric treatment nystatin S&S  Baltazar Apo, MD, PhD 06/22/2021, 1:37 PM [facility] [phone] or if no answer [phone]

## 2021-06-29 ENCOUNTER — Ambulatory Visit (INDEPENDENT_AMBULATORY_CARE_PROVIDER_SITE_OTHER): Payer: Medicare Other

## 2021-06-29 ENCOUNTER — Other Ambulatory Visit: Payer: Self-pay

## 2021-06-29 ENCOUNTER — Encounter: Payer: Self-pay | Admitting: Emergency Medicine

## 2021-06-29 VITALS — BP 150/86 | HR 70 | Temp 98.4°F | Resp 18 | Ht 62.0 in | Wt 205.8 lb

## 2021-06-29 DIAGNOSIS — J4520 Mild intermittent asthma, uncomplicated: Secondary | ICD-10-CM

## 2021-06-29 DIAGNOSIS — J4540 Moderate persistent asthma, uncomplicated: Secondary | ICD-10-CM | POA: Diagnosis not present

## 2021-06-29 MED ORDER — METHYLPREDNISOLONE SODIUM SUCC 125 MG IJ SOLR: 125.0000 mg | Freq: Once | INTRAMUSCULAR | Status: DC | PRN

## 2021-06-29 MED ORDER — FAMOTIDINE IN NACL 20-0.9 MG/50ML-% IV SOLN: 20.0000 mg | Freq: Once | INTRAVENOUS | Status: DC | PRN

## 2021-06-29 MED ORDER — EPINEPHRINE 0.3 MG/0.3ML IJ SOAJ: 0.3000 mg | Freq: Once | INTRAMUSCULAR | Status: DC | PRN

## 2021-06-29 MED ORDER — DIPHENHYDRAMINE HCL 50 MG/ML IJ SOLN: 50.0000 mg | Freq: Once | INTRAMUSCULAR | Status: DC | PRN

## 2021-06-29 MED ORDER — MEPOLIZUMAB 100 MG ~~LOC~~ SOLR
100.0000 mg | Freq: Once | SUBCUTANEOUS | Status: AC
  Administered 2021-06-29: 100 mg via SUBCUTANEOUS
  Filled 2021-06-29: qty 1

## 2021-06-29 MED ORDER — ALBUTEROL SULFATE HFA 108 (90 BASE) MCG/ACT IN AERS: 2.0000 | INHALATION_SPRAY | Freq: Once | RESPIRATORY_TRACT | Status: DC | PRN

## 2021-06-29 MED ORDER — SODIUM CHLORIDE 0.9 % IV SOLN: Freq: Once | INTRAVENOUS | Status: DC | PRN

## 2021-06-29 NOTE — Progress Notes (Signed)
Diagnosis: Asthma  Provider:  Praveen Mannam, MD  Procedure: Injection  Nucala (Mepolizumab), Dose: 100 mg, Site: subcutaneous  Discharge: Condition: Good, Destination: Home . AVS provided to patient.   Performed by:  Saxton Chain E, LPN        

## 2021-06-30 DIAGNOSIS — L570 Actinic keratosis: Secondary | ICD-10-CM | POA: Diagnosis not present

## 2021-06-30 DIAGNOSIS — D485 Neoplasm of uncertain behavior of skin: Secondary | ICD-10-CM | POA: Diagnosis not present

## 2021-06-30 DIAGNOSIS — L738 Other specified follicular disorders: Secondary | ICD-10-CM | POA: Diagnosis not present

## 2021-06-30 DIAGNOSIS — L814 Other melanin hyperpigmentation: Secondary | ICD-10-CM | POA: Diagnosis not present

## 2021-06-30 DIAGNOSIS — D225 Melanocytic nevi of trunk: Secondary | ICD-10-CM | POA: Diagnosis not present

## 2021-06-30 DIAGNOSIS — C44311 Basal cell carcinoma of skin of nose: Secondary | ICD-10-CM | POA: Diagnosis not present

## 2021-06-30 DIAGNOSIS — L304 Erythema intertrigo: Secondary | ICD-10-CM | POA: Diagnosis not present

## 2021-06-30 DIAGNOSIS — L821 Other seborrheic keratosis: Secondary | ICD-10-CM | POA: Diagnosis not present

## 2021-07-24 ENCOUNTER — Other Ambulatory Visit: Payer: Self-pay | Admitting: Pharmacy Technician

## 2021-07-24 NOTE — Addendum Note (Signed)
Addended by: Tresa Res on: 07/24/2021 08:57 AM   Modules accepted: Orders

## 2021-07-27 ENCOUNTER — Other Ambulatory Visit: Payer: Self-pay

## 2021-07-27 ENCOUNTER — Ambulatory Visit (INDEPENDENT_AMBULATORY_CARE_PROVIDER_SITE_OTHER): Payer: Medicare Other | Admitting: *Deleted

## 2021-07-27 VITALS — BP 158/84 | HR 62 | Temp 98.7°F | Resp 18 | Ht 62.0 in | Wt 203.8 lb

## 2021-07-27 DIAGNOSIS — J4540 Moderate persistent asthma, uncomplicated: Secondary | ICD-10-CM

## 2021-07-27 DIAGNOSIS — H3562 Retinal hemorrhage, left eye: Secondary | ICD-10-CM | POA: Diagnosis not present

## 2021-07-27 DIAGNOSIS — H35033 Hypertensive retinopathy, bilateral: Secondary | ICD-10-CM | POA: Diagnosis not present

## 2021-07-27 DIAGNOSIS — J4520 Mild intermittent asthma, uncomplicated: Secondary | ICD-10-CM

## 2021-07-27 DIAGNOSIS — H35371 Puckering of macula, right eye: Secondary | ICD-10-CM | POA: Diagnosis not present

## 2021-07-27 DIAGNOSIS — H34812 Central retinal vein occlusion, left eye, with macular edema: Secondary | ICD-10-CM | POA: Diagnosis not present

## 2021-07-27 MED ORDER — SODIUM CHLORIDE 0.9 % IV SOLN: Freq: Once | INTRAVENOUS | Status: DC | PRN

## 2021-07-27 MED ORDER — ALBUTEROL SULFATE HFA 108 (90 BASE) MCG/ACT IN AERS: 2.0000 | INHALATION_SPRAY | Freq: Once | RESPIRATORY_TRACT | Status: DC | PRN

## 2021-07-27 MED ORDER — FAMOTIDINE IN NACL 20-0.9 MG/50ML-% IV SOLN: 20.0000 mg | Freq: Once | INTRAVENOUS | Status: DC | PRN

## 2021-07-27 MED ORDER — DIPHENHYDRAMINE HCL 50 MG/ML IJ SOLN: 50.0000 mg | Freq: Once | INTRAMUSCULAR | Status: DC | PRN

## 2021-07-27 MED ORDER — MEPOLIZUMAB 100 MG ~~LOC~~ SOLR
100.0000 mg | Freq: Once | SUBCUTANEOUS | Status: AC
  Administered 2021-07-27: 100 mg via SUBCUTANEOUS
  Filled 2021-07-27: qty 1

## 2021-07-27 MED ORDER — EPINEPHRINE 0.3 MG/0.3ML IJ SOAJ: 0.3000 mg | Freq: Once | INTRAMUSCULAR | Status: DC | PRN

## 2021-07-27 MED ORDER — METHYLPREDNISOLONE SODIUM SUCC 125 MG IJ SOLR: 125.0000 mg | Freq: Once | INTRAMUSCULAR | Status: DC | PRN

## 2021-07-27 NOTE — Progress Notes (Signed)
Diagnosis: Asthma  Provider:  Praveen Mannam, MD  Procedure: Injection  Nucala (Mepolizumab), Dose: 100 mg, Site: subcutaneous  Discharge: Condition: Good, Destination: Home . AVS provided to patient.   Performed by:  Kamarie Veno A, RN        

## 2021-08-07 DIAGNOSIS — S29012A Strain of muscle and tendon of back wall of thorax, initial encounter: Secondary | ICD-10-CM | POA: Diagnosis not present

## 2021-08-07 DIAGNOSIS — M9903 Segmental and somatic dysfunction of lumbar region: Secondary | ICD-10-CM | POA: Diagnosis not present

## 2021-08-07 DIAGNOSIS — M9901 Segmental and somatic dysfunction of cervical region: Secondary | ICD-10-CM | POA: Diagnosis not present

## 2021-08-07 DIAGNOSIS — M5136 Other intervertebral disc degeneration, lumbar region: Secondary | ICD-10-CM | POA: Diagnosis not present

## 2021-08-07 DIAGNOSIS — M9902 Segmental and somatic dysfunction of thoracic region: Secondary | ICD-10-CM | POA: Diagnosis not present

## 2021-08-07 DIAGNOSIS — M9905 Segmental and somatic dysfunction of pelvic region: Secondary | ICD-10-CM | POA: Diagnosis not present

## 2021-08-07 DIAGNOSIS — M5137 Other intervertebral disc degeneration, lumbosacral region: Secondary | ICD-10-CM | POA: Diagnosis not present

## 2021-08-07 DIAGNOSIS — M5032 Other cervical disc degeneration, mid-cervical region, unspecified level: Secondary | ICD-10-CM | POA: Diagnosis not present

## 2021-08-09 DIAGNOSIS — M9902 Segmental and somatic dysfunction of thoracic region: Secondary | ICD-10-CM | POA: Diagnosis not present

## 2021-08-09 DIAGNOSIS — M9905 Segmental and somatic dysfunction of pelvic region: Secondary | ICD-10-CM | POA: Diagnosis not present

## 2021-08-09 DIAGNOSIS — M9901 Segmental and somatic dysfunction of cervical region: Secondary | ICD-10-CM | POA: Diagnosis not present

## 2021-08-09 DIAGNOSIS — M5137 Other intervertebral disc degeneration, lumbosacral region: Secondary | ICD-10-CM | POA: Diagnosis not present

## 2021-08-09 DIAGNOSIS — M5136 Other intervertebral disc degeneration, lumbar region: Secondary | ICD-10-CM | POA: Diagnosis not present

## 2021-08-09 DIAGNOSIS — S29012A Strain of muscle and tendon of back wall of thorax, initial encounter: Secondary | ICD-10-CM | POA: Diagnosis not present

## 2021-08-09 DIAGNOSIS — M9903 Segmental and somatic dysfunction of lumbar region: Secondary | ICD-10-CM | POA: Diagnosis not present

## 2021-08-09 DIAGNOSIS — M5032 Other cervical disc degeneration, mid-cervical region, unspecified level: Secondary | ICD-10-CM | POA: Diagnosis not present

## 2021-08-14 DIAGNOSIS — M5136 Other intervertebral disc degeneration, lumbar region: Secondary | ICD-10-CM | POA: Diagnosis not present

## 2021-08-14 DIAGNOSIS — M9902 Segmental and somatic dysfunction of thoracic region: Secondary | ICD-10-CM | POA: Diagnosis not present

## 2021-08-14 DIAGNOSIS — M5032 Other cervical disc degeneration, mid-cervical region, unspecified level: Secondary | ICD-10-CM | POA: Diagnosis not present

## 2021-08-14 DIAGNOSIS — M5137 Other intervertebral disc degeneration, lumbosacral region: Secondary | ICD-10-CM | POA: Diagnosis not present

## 2021-08-14 DIAGNOSIS — M9903 Segmental and somatic dysfunction of lumbar region: Secondary | ICD-10-CM | POA: Diagnosis not present

## 2021-08-14 DIAGNOSIS — M9901 Segmental and somatic dysfunction of cervical region: Secondary | ICD-10-CM | POA: Diagnosis not present

## 2021-08-14 DIAGNOSIS — S29012A Strain of muscle and tendon of back wall of thorax, initial encounter: Secondary | ICD-10-CM | POA: Diagnosis not present

## 2021-08-14 DIAGNOSIS — M9905 Segmental and somatic dysfunction of pelvic region: Secondary | ICD-10-CM | POA: Diagnosis not present

## 2021-08-17 DIAGNOSIS — M9902 Segmental and somatic dysfunction of thoracic region: Secondary | ICD-10-CM | POA: Diagnosis not present

## 2021-08-17 DIAGNOSIS — M9903 Segmental and somatic dysfunction of lumbar region: Secondary | ICD-10-CM | POA: Diagnosis not present

## 2021-08-17 DIAGNOSIS — M9901 Segmental and somatic dysfunction of cervical region: Secondary | ICD-10-CM | POA: Diagnosis not present

## 2021-08-17 DIAGNOSIS — M9905 Segmental and somatic dysfunction of pelvic region: Secondary | ICD-10-CM | POA: Diagnosis not present

## 2021-08-17 DIAGNOSIS — S29012A Strain of muscle and tendon of back wall of thorax, initial encounter: Secondary | ICD-10-CM | POA: Diagnosis not present

## 2021-08-17 DIAGNOSIS — M5136 Other intervertebral disc degeneration, lumbar region: Secondary | ICD-10-CM | POA: Diagnosis not present

## 2021-08-17 DIAGNOSIS — M5137 Other intervertebral disc degeneration, lumbosacral region: Secondary | ICD-10-CM | POA: Diagnosis not present

## 2021-08-17 DIAGNOSIS — M5032 Other cervical disc degeneration, mid-cervical region, unspecified level: Secondary | ICD-10-CM | POA: Diagnosis not present

## 2021-08-21 DIAGNOSIS — M9905 Segmental and somatic dysfunction of pelvic region: Secondary | ICD-10-CM | POA: Diagnosis not present

## 2021-08-21 DIAGNOSIS — M9902 Segmental and somatic dysfunction of thoracic region: Secondary | ICD-10-CM | POA: Diagnosis not present

## 2021-08-21 DIAGNOSIS — M5032 Other cervical disc degeneration, mid-cervical region, unspecified level: Secondary | ICD-10-CM | POA: Diagnosis not present

## 2021-08-21 DIAGNOSIS — M5136 Other intervertebral disc degeneration, lumbar region: Secondary | ICD-10-CM | POA: Diagnosis not present

## 2021-08-21 DIAGNOSIS — S29012A Strain of muscle and tendon of back wall of thorax, initial encounter: Secondary | ICD-10-CM | POA: Diagnosis not present

## 2021-08-21 DIAGNOSIS — M5137 Other intervertebral disc degeneration, lumbosacral region: Secondary | ICD-10-CM | POA: Diagnosis not present

## 2021-08-21 DIAGNOSIS — M9903 Segmental and somatic dysfunction of lumbar region: Secondary | ICD-10-CM | POA: Diagnosis not present

## 2021-08-21 DIAGNOSIS — M9901 Segmental and somatic dysfunction of cervical region: Secondary | ICD-10-CM | POA: Diagnosis not present

## 2021-08-24 ENCOUNTER — Ambulatory Visit (INDEPENDENT_AMBULATORY_CARE_PROVIDER_SITE_OTHER): Payer: Medicare Other

## 2021-08-24 ENCOUNTER — Other Ambulatory Visit: Payer: Self-pay

## 2021-08-24 VITALS — BP 148/83 | HR 54 | Temp 98.6°F | Resp 16 | Ht 62.0 in | Wt 203.0 lb

## 2021-08-24 DIAGNOSIS — M5136 Other intervertebral disc degeneration, lumbar region: Secondary | ICD-10-CM | POA: Diagnosis not present

## 2021-08-24 DIAGNOSIS — S29012A Strain of muscle and tendon of back wall of thorax, initial encounter: Secondary | ICD-10-CM | POA: Diagnosis not present

## 2021-08-24 DIAGNOSIS — M9901 Segmental and somatic dysfunction of cervical region: Secondary | ICD-10-CM | POA: Diagnosis not present

## 2021-08-24 DIAGNOSIS — M9905 Segmental and somatic dysfunction of pelvic region: Secondary | ICD-10-CM | POA: Diagnosis not present

## 2021-08-24 DIAGNOSIS — J4520 Mild intermittent asthma, uncomplicated: Secondary | ICD-10-CM

## 2021-08-24 DIAGNOSIS — M9903 Segmental and somatic dysfunction of lumbar region: Secondary | ICD-10-CM | POA: Diagnosis not present

## 2021-08-24 DIAGNOSIS — M5032 Other cervical disc degeneration, mid-cervical region, unspecified level: Secondary | ICD-10-CM | POA: Diagnosis not present

## 2021-08-24 DIAGNOSIS — J4540 Moderate persistent asthma, uncomplicated: Secondary | ICD-10-CM | POA: Diagnosis not present

## 2021-08-24 DIAGNOSIS — M5137 Other intervertebral disc degeneration, lumbosacral region: Secondary | ICD-10-CM | POA: Diagnosis not present

## 2021-08-24 DIAGNOSIS — M9902 Segmental and somatic dysfunction of thoracic region: Secondary | ICD-10-CM | POA: Diagnosis not present

## 2021-08-24 MED ORDER — SODIUM CHLORIDE 0.9 % IV SOLN: Freq: Once | INTRAVENOUS | Status: DC | PRN

## 2021-08-24 MED ORDER — EPINEPHRINE 0.3 MG/0.3ML IJ SOAJ: 0.3000 mg | Freq: Once | INTRAMUSCULAR | Status: DC | PRN

## 2021-08-24 MED ORDER — MEPOLIZUMAB 100 MG ~~LOC~~ SOLR
100.0000 mg | Freq: Once | SUBCUTANEOUS | Status: AC
  Administered 2021-08-24: 100 mg via SUBCUTANEOUS
  Filled 2021-08-24: qty 1

## 2021-08-24 MED ORDER — METHYLPREDNISOLONE SODIUM SUCC 125 MG IJ SOLR: 125.0000 mg | Freq: Once | INTRAMUSCULAR | Status: DC | PRN

## 2021-08-24 MED ORDER — FAMOTIDINE IN NACL 20-0.9 MG/50ML-% IV SOLN: 20.0000 mg | Freq: Once | INTRAVENOUS | Status: DC | PRN

## 2021-08-24 MED ORDER — DIPHENHYDRAMINE HCL 50 MG/ML IJ SOLN: 50.0000 mg | Freq: Once | INTRAMUSCULAR | Status: DC | PRN

## 2021-08-24 MED ORDER — ALBUTEROL SULFATE HFA 108 (90 BASE) MCG/ACT IN AERS: 2.0000 | INHALATION_SPRAY | Freq: Once | RESPIRATORY_TRACT | Status: DC | PRN

## 2021-08-24 NOTE — Progress Notes (Addendum)
Diagnosis: Asthma  Provider:  Praveen Mannam, MD  Procedure: Injection  Nucala (Mepolizumab), Dose: 100 mg, Site: subcutaneous, Number of injections: 1  Discharge: Condition: Good, Destination: Home . AVS provided to patient.   Performed by:  Eyleen Rawlinson E, LPN        

## 2021-09-05 DIAGNOSIS — H401111 Primary open-angle glaucoma, right eye, mild stage: Secondary | ICD-10-CM | POA: Diagnosis not present

## 2021-09-05 DIAGNOSIS — H401122 Primary open-angle glaucoma, left eye, moderate stage: Secondary | ICD-10-CM | POA: Diagnosis not present

## 2021-09-19 DIAGNOSIS — Z481 Encounter for planned postprocedural wound closure: Secondary | ICD-10-CM | POA: Diagnosis not present

## 2021-09-19 DIAGNOSIS — C4491 Basal cell carcinoma of skin, unspecified: Secondary | ICD-10-CM | POA: Diagnosis not present

## 2021-09-19 DIAGNOSIS — C44311 Basal cell carcinoma of skin of nose: Secondary | ICD-10-CM | POA: Diagnosis not present

## 2021-09-21 ENCOUNTER — Ambulatory Visit (INDEPENDENT_AMBULATORY_CARE_PROVIDER_SITE_OTHER): Payer: Medicare Other

## 2021-09-21 ENCOUNTER — Other Ambulatory Visit: Payer: Self-pay

## 2021-09-21 VITALS — BP 161/81 | HR 57 | Temp 98.3°F | Resp 18 | Ht 62.0 in | Wt 205.0 lb

## 2021-09-21 DIAGNOSIS — H3562 Retinal hemorrhage, left eye: Secondary | ICD-10-CM | POA: Diagnosis not present

## 2021-09-21 DIAGNOSIS — J4520 Mild intermittent asthma, uncomplicated: Secondary | ICD-10-CM

## 2021-09-21 DIAGNOSIS — H35371 Puckering of macula, right eye: Secondary | ICD-10-CM | POA: Diagnosis not present

## 2021-09-21 DIAGNOSIS — J4540 Moderate persistent asthma, uncomplicated: Secondary | ICD-10-CM

## 2021-09-21 DIAGNOSIS — H34812 Central retinal vein occlusion, left eye, with macular edema: Secondary | ICD-10-CM | POA: Diagnosis not present

## 2021-09-21 DIAGNOSIS — H35033 Hypertensive retinopathy, bilateral: Secondary | ICD-10-CM | POA: Diagnosis not present

## 2021-09-21 MED ORDER — FAMOTIDINE IN NACL 20-0.9 MG/50ML-% IV SOLN: 20.0000 mg | Freq: Once | INTRAVENOUS | Status: DC | PRN

## 2021-09-21 MED ORDER — ALBUTEROL SULFATE HFA 108 (90 BASE) MCG/ACT IN AERS: 2.0000 | INHALATION_SPRAY | Freq: Once | RESPIRATORY_TRACT | Status: DC | PRN

## 2021-09-21 MED ORDER — DIPHENHYDRAMINE HCL 50 MG/ML IJ SOLN: 50.0000 mg | Freq: Once | INTRAMUSCULAR | Status: DC | PRN

## 2021-09-21 MED ORDER — MEPOLIZUMAB 100 MG ~~LOC~~ SOLR
100.0000 mg | Freq: Once | SUBCUTANEOUS | Status: AC
  Administered 2021-09-21: 100 mg via SUBCUTANEOUS
  Filled 2021-09-21: qty 1

## 2021-09-21 MED ORDER — SODIUM CHLORIDE 0.9 % IV SOLN: Freq: Once | INTRAVENOUS | Status: DC | PRN

## 2021-09-21 MED ORDER — METHYLPREDNISOLONE SODIUM SUCC 125 MG IJ SOLR: 125.0000 mg | Freq: Once | INTRAMUSCULAR | Status: DC | PRN

## 2021-09-21 MED ORDER — EPINEPHRINE 0.3 MG/0.3ML IJ SOAJ: 0.3000 mg | Freq: Once | INTRAMUSCULAR | Status: DC | PRN

## 2021-09-21 NOTE — Progress Notes (Signed)
Diagnosis: Asthma  Provider:  Marshell Garfinkel, MD  Procedure: Injection  Nucala (Mepolizumab), Dose: 100 mg, Site: subcutaneous R arm, Number of injections: 1  Discharge: Condition: Good, Destination: Home . AVS provided to patient.   Performed by:  Paul Dykes, RN

## 2021-10-15 ENCOUNTER — Other Ambulatory Visit: Payer: Self-pay | Admitting: Pharmacy Technician

## 2021-10-16 ENCOUNTER — Other Ambulatory Visit: Payer: Self-pay | Admitting: Emergency Medicine

## 2021-10-19 ENCOUNTER — Ambulatory Visit (INDEPENDENT_AMBULATORY_CARE_PROVIDER_SITE_OTHER): Payer: Medicare Other

## 2021-10-19 ENCOUNTER — Other Ambulatory Visit: Payer: Self-pay

## 2021-10-19 VITALS — BP 145/80 | HR 61 | Temp 98.6°F | Resp 18 | Ht 62.0 in | Wt 206.0 lb

## 2021-10-19 DIAGNOSIS — J4540 Moderate persistent asthma, uncomplicated: Secondary | ICD-10-CM | POA: Diagnosis not present

## 2021-10-19 MED ORDER — SODIUM CHLORIDE 0.9 % IV SOLN: Freq: Once | INTRAVENOUS | Status: DC | PRN

## 2021-10-19 MED ORDER — DIPHENHYDRAMINE HCL 50 MG/ML IJ SOLN: 50.0000 mg | Freq: Once | INTRAMUSCULAR | Status: DC | PRN

## 2021-10-19 MED ORDER — FAMOTIDINE IN NACL 20-0.9 MG/50ML-% IV SOLN: 20.0000 mg | Freq: Once | INTRAVENOUS | Status: DC | PRN

## 2021-10-19 MED ORDER — ALBUTEROL SULFATE HFA 108 (90 BASE) MCG/ACT IN AERS: 2.0000 | INHALATION_SPRAY | Freq: Once | RESPIRATORY_TRACT | Status: DC | PRN

## 2021-10-19 MED ORDER — MEPOLIZUMAB 100 MG ~~LOC~~ SOLR
100.0000 mg | Freq: Once | SUBCUTANEOUS | Status: AC
  Administered 2021-10-19: 100 mg via SUBCUTANEOUS
  Filled 2021-10-19: qty 1

## 2021-10-19 MED ORDER — EPINEPHRINE 0.3 MG/0.3ML IJ SOAJ: 0.3000 mg | Freq: Once | INTRAMUSCULAR | Status: DC | PRN

## 2021-10-19 MED ORDER — METHYLPREDNISOLONE SODIUM SUCC 125 MG IJ SOLR: 125.0000 mg | Freq: Once | INTRAMUSCULAR | Status: DC | PRN

## 2021-10-19 NOTE — Progress Notes (Signed)
Diagnosis: Asthma ° °Provider:  Praveen Mannam, MD ° °Procedure: Injection ° °Nucala (Mepolizumab), Dose: 100 mg, Site: subcutaneous, Number of injections: 1 ° °Discharge: Condition: Good, Destination: Home . AVS provided to patient.  ° °Performed by:  Jarielys Girardot, RN  ° ° ° °  °

## 2021-10-31 ENCOUNTER — Other Ambulatory Visit: Payer: Self-pay

## 2021-10-31 DIAGNOSIS — I10 Essential (primary) hypertension: Secondary | ICD-10-CM

## 2021-10-31 DIAGNOSIS — C44619 Basal cell carcinoma of skin of left upper limb, including shoulder: Secondary | ICD-10-CM | POA: Diagnosis not present

## 2021-10-31 DIAGNOSIS — L570 Actinic keratosis: Secondary | ICD-10-CM | POA: Diagnosis not present

## 2021-10-31 DIAGNOSIS — L821 Other seborrheic keratosis: Secondary | ICD-10-CM | POA: Diagnosis not present

## 2021-10-31 DIAGNOSIS — L814 Other melanin hyperpigmentation: Secondary | ICD-10-CM | POA: Diagnosis not present

## 2021-10-31 DIAGNOSIS — C44319 Basal cell carcinoma of skin of other parts of face: Secondary | ICD-10-CM | POA: Diagnosis not present

## 2021-10-31 DIAGNOSIS — L820 Inflamed seborrheic keratosis: Secondary | ICD-10-CM | POA: Diagnosis not present

## 2021-10-31 DIAGNOSIS — D225 Melanocytic nevi of trunk: Secondary | ICD-10-CM | POA: Diagnosis not present

## 2021-10-31 DIAGNOSIS — Z85828 Personal history of other malignant neoplasm of skin: Secondary | ICD-10-CM | POA: Diagnosis not present

## 2021-10-31 DIAGNOSIS — D485 Neoplasm of uncertain behavior of skin: Secondary | ICD-10-CM | POA: Diagnosis not present

## 2021-10-31 DIAGNOSIS — Z08 Encounter for follow-up examination after completed treatment for malignant neoplasm: Secondary | ICD-10-CM | POA: Diagnosis not present

## 2021-10-31 DIAGNOSIS — D1801 Hemangioma of skin and subcutaneous tissue: Secondary | ICD-10-CM | POA: Diagnosis not present

## 2021-10-31 MED ORDER — AMLODIPINE BESYLATE 5 MG PO TABS: 5.0000 mg | ORAL_TABLET | Freq: Every day | ORAL | 3 refills | Status: DC

## 2021-11-02 DIAGNOSIS — M9902 Segmental and somatic dysfunction of thoracic region: Secondary | ICD-10-CM | POA: Diagnosis not present

## 2021-11-02 DIAGNOSIS — M9901 Segmental and somatic dysfunction of cervical region: Secondary | ICD-10-CM | POA: Diagnosis not present

## 2021-11-02 DIAGNOSIS — M9903 Segmental and somatic dysfunction of lumbar region: Secondary | ICD-10-CM | POA: Diagnosis not present

## 2021-11-02 DIAGNOSIS — M5137 Other intervertebral disc degeneration, lumbosacral region: Secondary | ICD-10-CM | POA: Diagnosis not present

## 2021-11-02 DIAGNOSIS — M5032 Other cervical disc degeneration, mid-cervical region, unspecified level: Secondary | ICD-10-CM | POA: Diagnosis not present

## 2021-11-02 DIAGNOSIS — M5136 Other intervertebral disc degeneration, lumbar region: Secondary | ICD-10-CM | POA: Diagnosis not present

## 2021-11-02 DIAGNOSIS — S29012A Strain of muscle and tendon of back wall of thorax, initial encounter: Secondary | ICD-10-CM | POA: Diagnosis not present

## 2021-11-02 DIAGNOSIS — M9905 Segmental and somatic dysfunction of pelvic region: Secondary | ICD-10-CM | POA: Diagnosis not present

## 2021-11-03 ENCOUNTER — Other Ambulatory Visit: Payer: Self-pay

## 2021-11-03 DIAGNOSIS — M25571 Pain in right ankle and joints of right foot: Secondary | ICD-10-CM

## 2021-11-03 MED ORDER — MELOXICAM 15 MG PO TABS: ORAL_TABLET | ORAL | 1 refills | Status: DC

## 2021-11-08 ENCOUNTER — Ambulatory Visit (INDEPENDENT_AMBULATORY_CARE_PROVIDER_SITE_OTHER): Payer: Medicare Other

## 2021-11-08 VITALS — BP 122/68 | HR 62 | Temp 98.3°F | Ht 62.0 in | Wt 206.2 lb

## 2021-11-08 DIAGNOSIS — Z1231 Encounter for screening mammogram for malignant neoplasm of breast: Secondary | ICD-10-CM | POA: Diagnosis not present

## 2021-11-08 DIAGNOSIS — Z1211 Encounter for screening for malignant neoplasm of colon: Secondary | ICD-10-CM | POA: Diagnosis not present

## 2021-11-08 DIAGNOSIS — Z0000 Encounter for general adult medical examination without abnormal findings: Secondary | ICD-10-CM | POA: Diagnosis not present

## 2021-11-08 NOTE — Patient Instructions (Addendum)
Ana Hughes , Thank you for taking time to come for your Medicare Wellness Visit. I appreciate your ongoing commitment to your health goals. Please review the following plan we discussed and let me know if I can assist you in the future.   These are the goals we discussed:  Goals       Weight (lb) < 175 lb (79.4 kg) (pt-stated)      Would like to increase exercise by swimming and walking.        This is a list of the screening recommended for you and due dates:  Health Maintenance  Topic Date Due   COVID-19 Vaccine (6 - Booster for Pfizer series) 06/09/2021   Urine Protein Check  08/31/2021   Zoster (Shingles) Vaccine (2 of 2) 02/05/2022*   Pneumonia Vaccine (1 - PCV) 11/08/2022*   Mammogram  11/08/2022*   Colon Cancer Screening  11/08/2022*   Tetanus Vaccine  09/30/2025   Flu Shot  Completed   DEXA scan (bone density measurement)  Completed   Hepatitis C Screening: USPSTF Recommendation to screen - Ages 21-79 yo.  Completed   HPV Vaccine  Aged Out  *Topic was postponed. The date shown is not the original due date.   Advanced directives: No Patient  deferred  Conditions/risks identified: None  Next appointment: Follow up in one year for your annual wellness visit    Preventive Care 65 Years and Older, Female Preventive care refers to lifestyle choices and visits with your health care provider that can promote health and wellness. What does preventive care include? A yearly physical exam. This is also called an annual well check. Dental exams once or twice a year. Routine eye exams. Ask your health care provider how often you should have your eyes checked. Personal lifestyle choices, including: Daily care of your teeth and gums. Regular physical activity. Eating a healthy diet. Avoiding tobacco and drug use. Limiting alcohol use. Practicing safe sex. Taking low-dose aspirin every day. Taking vitamin and mineral supplements as recommended by your health care  provider. What happens during an annual well check? The services and screenings done by your health care provider during your annual well check will depend on your age, overall health, lifestyle risk factors, and family history of disease. Counseling  Your health care provider may ask you questions about your: Alcohol use. Tobacco use. Drug use. Emotional well-being. Home and relationship well-being. Sexual activity. Eating habits. History of falls. Memory and ability to understand (cognition). Work and work Statistician. Reproductive health. Screening  You may have the following tests or measurements: Height, weight, and BMI. Blood pressure. Lipid and cholesterol levels. These may be checked every 5 years, or more frequently if you are over 75 years old. Skin check. Lung cancer screening. You may have this screening every year starting at age 96 if you have a 30-pack-year history of smoking and currently smoke or have quit within the past 15 years. Fecal occult blood test (FOBT) of the stool. You may have this test every year starting at age 25. Flexible sigmoidoscopy or colonoscopy. You may have a sigmoidoscopy every 5 years or a colonoscopy every 10 years starting at age 66. Hepatitis C blood test. Hepatitis B blood test. Sexually transmitted disease (STD) testing. Diabetes screening. This is done by checking your blood sugar (glucose) after you have not eaten for a while (fasting). You may have this done every 1-3 years. Bone density scan. This is done to screen for osteoporosis. You may have this  done starting at age 60. Mammogram. This may be done every 1-2 years. Talk to your health care provider about how often you should have regular mammograms. Talk with your health care provider about your test results, treatment options, and if necessary, the need for more tests. Vaccines  Your health care provider may recommend certain vaccines, such as: Influenza vaccine. This is  recommended every year. Tetanus, diphtheria, and acellular pertussis (Tdap, Td) vaccine. You may need a Td booster every 10 years. Zoster vaccine. You may need this after age 50. Pneumococcal 13-valent conjugate (PCV13) vaccine. One dose is recommended after age 45. Pneumococcal polysaccharide (PPSV23) vaccine. One dose is recommended after age 31. Talk to your health care provider about which screenings and vaccines you need and how often you need them. This information is not intended to replace advice given to you by your health care provider. Make sure you discuss any questions you have with your health care provider. Document Released: 09/30/2015 Document Revised: 05/23/2016 Document Reviewed: 07/05/2015 Elsevier Interactive Patient Education  2017 [place] Prevention in the Home Falls can cause injuries. They can happen to people of all ages. There are many things you can do to make your home safe and to help prevent falls. What can I do on the outside of my home? Regularly fix the edges of walkways and driveways and fix any cracks. Remove anything that might make you trip as you walk through a door, such as a raised step or threshold. Trim any bushes or trees on the path to your home. Use bright outdoor lighting. Clear any walking paths of anything that might make someone trip, such as rocks or tools. Regularly check to see if handrails are loose or broken. Make sure that both sides of any steps have handrails. Any raised decks and porches should have guardrails on the edges. Have any leaves, snow, or ice cleared regularly. Use sand or salt on walking paths during winter. Clean up any spills in your garage right away. This includes oil or grease spills. What can I do in the bathroom? Use night lights. Install grab bars by the toilet and in the tub and shower. Do not use towel bars as grab bars. Use non-skid mats or decals in the tub or shower. If you need to sit down in  the shower, use a plastic, non-slip stool. Keep the floor dry. Clean up any water that spills on the floor as soon as it happens. Remove soap buildup in the tub or shower regularly. Attach bath mats securely with double-sided non-slip rug tape. Do not have throw rugs and other things on the floor that can make you trip. What can I do in the bedroom? Use night lights. Make sure that you have a light by your bed that is easy to reach. Do not use any sheets or blankets that are too big for your bed. They should not hang down onto the floor. Have a firm chair that has side arms. You can use this for support while you get dressed. Do not have throw rugs and other things on the floor that can make you trip. What can I do in the kitchen? Clean up any spills right away. Avoid walking on wet floors. Keep items that you use a lot in easy-to-reach places. If you need to reach something above you, use a strong step stool that has a grab bar. Keep electrical cords out of the way. Do not use floor polish or wax  that makes floors slippery. If you must use wax, use non-skid floor wax. Do not have throw rugs and other things on the floor that can make you trip. What can I do with my stairs? Do not leave any items on the stairs. Make sure that there are handrails on both sides of the stairs and use them. Fix handrails that are broken or loose. Make sure that handrails are as long as the stairways. Check any carpeting to make sure that it is firmly attached to the stairs. Fix any carpet that is loose or worn. Avoid having throw rugs at the top or bottom of the stairs. If you do have throw rugs, attach them to the floor with carpet tape. Make sure that you have a light switch at the top of the stairs and the bottom of the stairs. If you do not have them, ask someone to add them for you. What else can I do to help prevent falls? Wear shoes that: Do not have high heels. Have rubber bottoms. Are comfortable  and fit you well. Are closed at the toe. Do not wear sandals. If you use a stepladder: Make sure that it is fully opened. Do not climb a closed stepladder. Make sure that both sides of the stepladder are locked into place. Ask someone to hold it for you, if possible. Clearly mark and make sure that you can see: Any grab bars or handrails. First and last steps. Where the edge of each step is. Use tools that help you move around (mobility aids) if they are needed. These include: Canes. Walkers. Scooters. Crutches. Turn on the lights when you go into a dark area. Replace any light bulbs as soon as they burn out. Set up your furniture so you have a clear path. Avoid moving your furniture around. If any of your floors are uneven, fix them. If there are any pets around you, be aware of where they are. Review your medicines with your doctor. Some medicines can make you feel dizzy. This can increase your chance of falling. Ask your doctor what other things that you can do to help prevent falls. This information is not intended to replace advice given to you by your health care provider. Make sure you discuss any questions you have with your health care provider. Document Released: 06/30/2009 Document Revised: 02/09/2016 Document Reviewed: 10/08/2014 Elsevier Interactive Patient Education  2017 [employer].

## 2021-11-08 NOTE — Progress Notes (Signed)
Subjective:   Ana Hughes is a 71 y.o. female who presents for Medicare Annual (Subsequent) preventive examination.  Review of Systems       Objective:    Today's Vitals   11/08/21 1353  BP: 122/68  Pulse: 62  Temp: 98.3 F (36.8 C)  TempSrc: Oral  SpO2: 98%  Weight: 206 lb 3.2 oz (93.5 kg)  Height: 5\' 2"  (1.575 m)   Body mass index is 37.71 kg/m.  Advanced Directives 11/08/2021 12/14/2020 09/14/2020  Does Patient Have a Medical Advance Directive? No No No  Would patient like information on creating a medical advance directive? No - Patient declined No - Patient declined No - Patient declined    Current Medications (verified) Outpatient Encounter Medications as of 11/08/2021  Medication Sig   Aflibercept (EYLEA) 2 MG/0.05ML SOSY by Intravitreal route. Given by eye doctor.   amLODipine (NORVASC) 5 MG tablet Take 1 tablet (5 mg total) by mouth daily.   bimatoprost (LUMIGAN) 0.01 % SOLN Place 1 drop into both eyes at bedtime.   dorzolamide (TRUSOPT) 2 % ophthalmic solution Place 1 drop into both eyes 3 (three) times daily.   DULERA 200-5 MCG/ACT AERO USE 2 INHALATIONS TWICE A DAY (MUST HAVE OFFICE VISIT FOR FURTHER REFILLS)   FLUoxetine (PROZAC) 40 MG capsule Take 1 capsule (40 mg total) by mouth daily.   gabapentin (NEURONTIN) 300 MG capsule TAKE ONE CAPSULE BY MOUTH DAILY AS NEEDED   meloxicam (MOBIC) 15 MG tablet TAKE ONE TABLET BY MOUTH DAILY AS NEEDED;   montelukast (SINGULAIR) 10 MG tablet Take 1 tablet (10 mg total) by mouth at bedtime.   nystatin (MYCOSTATIN) 100000 UNIT/ML suspension Take 5 mLs (500,000 Units total) by mouth 3 (three) times daily.   timolol (TIMOPTIC-XR) 0.25 % ophthalmic gel-forming Place 1 drop into the left eye daily.   No facility-administered encounter medications on file as of 11/08/2021.    Allergies (verified) Simbrinza [brinzolamide-brimonidine]   History: Past Medical History:  Diagnosis Date   Arthritis    Asthma     Depression    Glaucoma    History of colon polyps    Hypertension    Past Surgical History:  Procedure Laterality Date   CESAREAN SECTION  1975, 1977, 1981   MOHS SURGERY     REPLACEMENT TOTAL KNEE BILATERAL Bilateral    left knee, 2005, right knee 2004   TOTAL HIP ARTHROPLASTY Bilateral 012, right hip, 2013 left hip   Family History  Problem Relation Age of Onset   Cancer Mother    Hypertension Mother    Cancer Father    Depression Daughter    Drug abuse Daughter    Depression Daughter    Drug abuse Daughter    Heart attack Daughter    Depression Daughter    Drug abuse Daughter    Mental illness Daughter    Social History   Socioeconomic History   Marital status: Widowed    Spouse name: Not on file   Number of children: 3   Years of education: Not on file   Highest education level: Not on file  Occupational History   Not on file  Tobacco Use   Smoking status: Never   Smokeless tobacco: Never  Vaping Use   Vaping Use: Never used  Substance and Sexual Activity   Alcohol use: Yes   Drug use: Never   Sexual activity: Not Currently  Other Topics Concern   Not on file  Social History Narrative  Not on file   Social Determinants of Health   Financial Resource Strain: Low Risk    Difficulty of Paying Living Expenses: Not hard at all  Food Insecurity: No Food Insecurity   Worried About Charity fundraiser in the Last Year: Never true   [place] in the Last Year: Never true  Transportation Needs: No Transportation Needs   Lack of Transportation (Medical): No   Lack of Transportation (Non-Medical): No  Physical Activity: Insufficiently Active   Days of Exercise per Week: 2 days   Minutes of Exercise per Session: 10 min  Stress: No Stress Concern Present   Feeling of Stress : Not at all  Social Connections: Socially Isolated   Frequency of Communication with Friends and Family: More than three times a week   Frequency of Social Gatherings with Friends  and Family: More than three times a week   Attends Religious Services: Never   Marine scientist or Organizations: No   Attends Archivist Meetings: Never   Marital Status: Widowed     Clinical Intake:  Pre-visit preparation completed: Yes Diabetic? No  Interpreter Needed?: NoActivities of Daily Living In your present state of health, do you have any difficulty performing the following activities: 11/08/2021 12/14/2020  Hearing? 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 N N  Difficulty concentrating or making decisions? N N  Walking or climbing stairs? N N  Dressing or bathing? N N  Doing errands, shopping? N N  Preparing Food and eating ? N N  Using the Toilet? N N  In the past six months, have you accidently leaked urine? Tempie Donning  Comment Wears pads has some occasional leakage with urgency  Do you have problems with loss of bowel control? N N  Managing your Medications? N N  Managing your Finances? N N  Housekeeping or managing your Housekeeping? N N  Some recent data might be hidden    Patient Care Team: Martinique, Betty G, MD as PCP - General (Family Medicine)  Indicate any recent Medical Services you may have received from other than [facility] providers in the past year (date may be approximate).     Assessment:   This is a routine wellness examination for Ana Hughes.  Hearing/Vision screen Hearing Screening - Comments:: No difficulty hearing Vision Screening - Comments:: Wears glasses. Followed by Peter Garter  Dietary issues and exercise activities discussed: Exercise limited by: None identified   Goals Addressed               This Visit's Progress     Weight (lb) < 175 lb (79.4 kg) (pt-stated)   206 lb 3.2 oz (93.5 kg)     Would like to increase exercise by swimming and walking.       Depression Screen PHQ 2/9 Scores 11/08/2021 12/14/2020 09/14/2020 08/31/2020  PHQ - 2 Score 0 1 0 1  PHQ- 9 Score - 1 - 3    Fall Risk Fall Risk  11/08/2021 12/14/2020  09/14/2020  Falls in the past year? 0 0 0  Number falls in past yr: 0 0 0  Injury with Fall? 0 0 -  Risk for fall due to : No Fall Risks No Fall Risks -  Follow up - Falls evaluation completed;Falls prevention discussed -    FALL RISK PREVENTION PERTAINING TO THE HOME:  Any stairs in or around the home? Yes  If so, are there any without handrails? No  Home free of  loose throw rugs in walkways, pet beds, electrical cords, etc? Yes  Adequate lighting in your home to reduce risk of falls? No   ASSISTIVE DEVICES UTILIZED TO PREVENT FALLS:  Life alert? No  Use of a cane, walker or w/c? No  Grab bars in the bathroom? No  Shower chair or bench in shower? No  Elevated toilet seat or a handicapped toilet? No   TIMED UP AND GO:  Was the test performed? Yes .  Length of time to ambulate 10 feet: 5 sec.   Gait steady and fast without use of assistive device  Cognitive Function:     6CIT Screen 11/08/2021  What Year? 0 points  What month? 0 points  What time? 0 points  Count back from 20 0 points  Months in reverse 0 points  Repeat phrase 0 points  Total Score 0    Immunizations Immunization History  Administered Date(s) Administered   Fluad Quad(high Dose 65+) 06/22/2019, 08/31/2020, 06/20/2021   Influenza, High Dose Seasonal PF 07/03/2018   PFIZER Comirnaty(Gray Top)Covid-19 Tri-Sucrose Vaccine 04/14/2021   PFIZER(Purple Top)SARS-COV-2 Vaccination 10/22/2019, 11/12/2019, 06/10/2020, 04/14/2021   Tdap 10/01/2015   Zoster Recombinat (Shingrix) 12/05/2020    TDAP status: Up to date  Flu Vaccine status: Up to date  Pneumococcal vaccine status: Due, Education has been provided regarding the importance of this vaccine. Advised may receive this vaccine at local pharmacy or Health Dept. Aware to provide a copy of the vaccination record if obtained from local pharmacy or Health Dept. Verbalized acceptance and understanding.  Covid-19 vaccine status: Completed  vaccines  Qualifies for Shingles Vaccine? Yes   Zostavax completed Yes   Shingrix Completed?: Yes Patient stated received vaccine 7/22  Screening Tests Health Maintenance  Topic Date Due   COVID-19 Vaccine (6 - Booster for Pfizer series) 06/09/2021   URINE MICROALBUMIN  08/31/2021   Zoster Vaccines- Shingrix (2 of 2) 02/05/2022 (Originally 01/30/2021)   Pneumonia Vaccine 36+ Years old (1 - PCV) 11/08/2022 (Originally 11/15/2017)   MAMMOGRAM  11/08/2022 (Originally 02/15/2021)   COLONOSCOPY (Pts 45-73yrs Insurance coverage will need to be confirmed)  11/08/2022 (Originally 04/18/2021)   TETANUS/TDAP  09/30/2025   INFLUENZA VACCINE  Completed   DEXA SCAN  Completed   Hepatitis C Screening  Completed   HPV VACCINES  Aged Out    Health Maintenance  Health Maintenance Due  Topic Date Due   COVID-19 Vaccine (6 - Booster for [place] series) 06/09/2021   URINE MICROALBUMIN  08/31/2021    Colorectal cancer screening: Referral to GI placed 11/08/21. Pt aware the office will call re: appt.  Mammogram status: Ordered 11/08/21. Pt provided with contact info and advised to call to schedule appt.   Bone Density status: Completed 08/19/20. Results reflect: Bone density results: OSTEOPOROSIS. Repeat every 2 years.  Lung Cancer Screening: (Low Dose CT Chest recommended if Age 39-80 years, 30 pack-year currently smoking OR have quit w/in 15years.) does not qualify.     Additional Screening:  Hepatitis C Screening: does qualify; Completed 08/31/20  Vision Screening: Recommended annual ophthalmology exams for early detection of glaucoma and other disorders of the eye. Is the patient up to date with their annual eye exam?  Yes  Who is the provider or what is the name of the office in which the patient attends annual eye exams? Dr Peter Garter If pt is not established with a provider, would they like to be referred to a provider to establish care? No .   Dental Screening: Recommended  annual dental exams for  proper oral hygiene  Community Resource Referral / Chronic Care Management:  CRR required this visit?  No   CCM required this visit?  No      Plan:     I have personally reviewed and noted the following in the patients chart:   Medical and social history Use of alcohol, tobacco or illicit drugs  Current medications and supplements including opioid prescriptions.  Functional ability and status Nutritional status Physical activity Advanced directives List of other physicians Hospitalizations, surgeries, and ER visits in previous 12 months Vitals Screenings to include cognitive, depression, and falls Referrals and appointments  In addition, I have reviewed and discussed with patient certain preventive protocols, quality metrics, and best practice recommendations. A written personalized care plan for preventive services as well as general preventive health recommendations were provided to patient.     Criselda Peaches, LPN   [id number]/[id number]   Nurse Notes: Patient request f/u visit with concerns of screening for brain cancer.

## 2021-11-15 ENCOUNTER — Encounter: Payer: Self-pay | Admitting: Physician Assistant

## 2021-11-16 ENCOUNTER — Other Ambulatory Visit: Payer: Self-pay

## 2021-11-16 ENCOUNTER — Ambulatory Visit (INDEPENDENT_AMBULATORY_CARE_PROVIDER_SITE_OTHER): Payer: Medicare Other

## 2021-11-16 VITALS — BP 149/83 | HR 54 | Temp 97.8°F | Resp 16 | Ht 62.0 in | Wt 207.2 lb

## 2021-11-16 DIAGNOSIS — J4540 Moderate persistent asthma, uncomplicated: Secondary | ICD-10-CM

## 2021-11-16 DIAGNOSIS — M9901 Segmental and somatic dysfunction of cervical region: Secondary | ICD-10-CM | POA: Diagnosis not present

## 2021-11-16 DIAGNOSIS — S29012A Strain of muscle and tendon of back wall of thorax, initial encounter: Secondary | ICD-10-CM | POA: Diagnosis not present

## 2021-11-16 DIAGNOSIS — M9902 Segmental and somatic dysfunction of thoracic region: Secondary | ICD-10-CM | POA: Diagnosis not present

## 2021-11-16 DIAGNOSIS — M5032 Other cervical disc degeneration, mid-cervical region, unspecified level: Secondary | ICD-10-CM | POA: Diagnosis not present

## 2021-11-16 DIAGNOSIS — M5136 Other intervertebral disc degeneration, lumbar region: Secondary | ICD-10-CM | POA: Diagnosis not present

## 2021-11-16 DIAGNOSIS — M9903 Segmental and somatic dysfunction of lumbar region: Secondary | ICD-10-CM | POA: Diagnosis not present

## 2021-11-16 DIAGNOSIS — M9905 Segmental and somatic dysfunction of pelvic region: Secondary | ICD-10-CM | POA: Diagnosis not present

## 2021-11-16 DIAGNOSIS — M5137 Other intervertebral disc degeneration, lumbosacral region: Secondary | ICD-10-CM | POA: Diagnosis not present

## 2021-11-16 MED ORDER — SODIUM CHLORIDE 0.9 % IV SOLN: Freq: Once | INTRAVENOUS | Status: DC | PRN

## 2021-11-16 MED ORDER — ALBUTEROL SULFATE HFA 108 (90 BASE) MCG/ACT IN AERS: 2.0000 | INHALATION_SPRAY | Freq: Once | RESPIRATORY_TRACT | Status: DC | PRN

## 2021-11-16 MED ORDER — DIPHENHYDRAMINE HCL 50 MG/ML IJ SOLN: 50.0000 mg | Freq: Once | INTRAMUSCULAR | Status: DC | PRN

## 2021-11-16 MED ORDER — FAMOTIDINE IN NACL 20-0.9 MG/50ML-% IV SOLN: 20.0000 mg | Freq: Once | INTRAVENOUS | Status: DC | PRN

## 2021-11-16 MED ORDER — MEPOLIZUMAB 100 MG ~~LOC~~ SOLR
100.0000 mg | Freq: Once | SUBCUTANEOUS | Status: AC
  Administered 2021-11-16: 100 mg via SUBCUTANEOUS
  Filled 2021-11-16: qty 1

## 2021-11-16 MED ORDER — EPINEPHRINE 0.3 MG/0.3ML IJ SOAJ: 0.3000 mg | Freq: Once | INTRAMUSCULAR | Status: DC | PRN

## 2021-11-16 MED ORDER — METHYLPREDNISOLONE SODIUM SUCC 125 MG IJ SOLR: 125.0000 mg | Freq: Once | INTRAMUSCULAR | Status: DC | PRN

## 2021-11-16 NOTE — Progress Notes (Signed)
Diagnosis: Asthma ? ?Provider:  Marshell Garfinkel, MD ? ?Procedure: Injection ? ?Nucala (Mepolizumab), Dose: 100 mg, Site: subcutaneous, right arm ? Number of injections: 1 ? ?Discharge: Condition: Good, Destination: Home . AVS declined. ? ?Performed by:  Koren Shiver, RN  ? ? ? ?  ?

## 2021-11-17 ENCOUNTER — Telehealth: Payer: Self-pay | Admitting: Emergency Medicine

## 2021-11-17 MED ORDER — MONTELUKAST SODIUM 10 MG PO TABS: 10.0000 mg | ORAL_TABLET | Freq: Every day | ORAL | 1 refills | Status: DC

## 2021-11-17 NOTE — Telephone Encounter (Signed)
Refill was sent and patient is aware.  ?

## 2021-11-27 NOTE — Progress Notes (Unsigned)
11/27/2021 Ana Hughes [id number] 08-28-1951   ASSESSMENT AND PLAN:  *** There are no diagnoses linked to this encounter.   Patient Care Team: Martinique, Betty G, MD as PCP - General (Family Medicine)  HISTORY OF PRESENT ILLNESS: 71 y.o. female referred by Martinique, Betty G, MD, with a past medical history of hypertension, asthma, morbid obesity, hyperlipidemia, depression and others listed below presents for evaluation of colonoscopy for history of polyps.  04/18/2016 colonoscopy in [place], good bowel prep, diverticula, 3 mm tubular adenomatous polyp  External labs and notes reviewed this visit: ***  Current Medications:    Current Outpatient Medications (Cardiovascular):    amLODipine (NORVASC) 5 MG tablet, Take 1 tablet (5 mg total) by mouth daily.  Current Outpatient Medications (Respiratory):    DULERA 200-5 MCG/ACT AERO, USE 2 INHALATIONS TWICE A DAY (MUST HAVE OFFICE VISIT FOR FURTHER REFILLS)   montelukast (SINGULAIR) 10 MG tablet, Take 1 tablet (10 mg total) by mouth at bedtime.  Current Outpatient Medications (Analgesics):    meloxicam (MOBIC) 15 MG tablet, TAKE ONE TABLET BY MOUTH DAILY AS NEEDED;   Current Outpatient Medications (Other):    Aflibercept (EYLEA) 2 MG/0.05ML SOSY, by Intravitreal route. Given by eye doctor.   bimatoprost (LUMIGAN) 0.01 % SOLN, Place 1 drop into both eyes at bedtime.   dorzolamide (TRUSOPT) 2 % ophthalmic solution, Place 1 drop into both eyes 3 (three) times daily.   FLUoxetine (PROZAC) 40 MG capsule, Take 1 capsule (40 mg total) by mouth daily.   gabapentin (NEURONTIN) 300 MG capsule, TAKE ONE CAPSULE BY MOUTH DAILY AS NEEDED   nystatin (MYCOSTATIN) 100000 UNIT/ML suspension, Take 5 mLs (500,000 Units total) by mouth 3 (three) times daily.   timolol (TIMOPTIC-XR) 0.25 % ophthalmic gel-forming, Place 1 drop into the left eye daily.  Medical History:  Past Medical History:  Diagnosis Date   Arthritis    Asthma     Depression    Glaucoma    History of colon polyps    Hypertension    Allergies:  Allergies  Allergen Reactions   Simbrinza [Brinzolamide-Brimonidine]      Surgical History:  She  has a past surgical history that includes Replacement total knee bilateral (Bilateral); Total hip arthroplasty (Bilateral, 012, right hip, 2013 left hip); Cesarean section (1975, 1977, 1981); and Mohs surgery. Family History:  Her family history includes Cancer in her father and mother; Depression in her daughter, daughter, and daughter; Drug abuse in her daughter, daughter, and daughter; Heart attack in her daughter; Hypertension in her mother; Mental illness in her daughter. Social History:   reports that she has never smoked. She has never used smokeless tobacco. She reports current alcohol use. She reports that she does not use drugs.  REVIEW OF SYSTEMS  : All other systems reviewed and negative except where noted in the History of Present Illness.   PHYSICAL EXAM: There were no vitals taken for this visit. General:   Pleasant, well developed female in no acute distress Head:  Normocephalic and atraumatic. Eyes: {sclerae:26738},conjunctive {conjuctiva:26739}  Heart:  {HEART EXAM HEM/ONC:21750} Pulm: Clear anteriorly; no wheezing Abdomen:  {BlankSingle:19197::"Distended","Ridged","Soft"}, {BlankSingle:19197::"Flat","Obese"} AB, skin exam {ABDOMEN SKIN EXAM:22649}, {BlankSingle:19197::"Absent","Hyperactive, tinkling","Hypoactive","Sluggish","Normal"} bowel sounds. {Desc; pc desc - abdomen tenderness:5168} tenderness {anatomy; site abdomen:5010}. {BlankMultiple:19196::"Without guarding","With guarding","Without rebound","With rebound"}, {Exam; abdomen organomegaly:15152}. Extremities:  {With/Without:304960234} edema. Msk:  Symmetrical without gross deformities. Peripheral pulses intact.  Neurologic:  Alert and  oriented x4;  grossly normal neurologically. Skin:   Dry and intact without  significant  lesions or rashes. Psychiatric: Demonstrates good judgement and reason without abnormal affect or behaviors.   Vladimir Crofts, PA-C 8:31 AM

## 2021-11-29 ENCOUNTER — Encounter: Payer: Self-pay | Admitting: Physician Assistant

## 2021-11-29 ENCOUNTER — Ambulatory Visit (INDEPENDENT_AMBULATORY_CARE_PROVIDER_SITE_OTHER): Payer: Medicare Other | Admitting: Physician Assistant

## 2021-11-29 VITALS — BP 136/78 | HR 55 | Ht 62.0 in | Wt 206.6 lb

## 2021-11-29 DIAGNOSIS — M5136 Other intervertebral disc degeneration, lumbar region: Secondary | ICD-10-CM | POA: Diagnosis not present

## 2021-11-29 DIAGNOSIS — M9901 Segmental and somatic dysfunction of cervical region: Secondary | ICD-10-CM | POA: Diagnosis not present

## 2021-11-29 DIAGNOSIS — K5730 Diverticulosis of large intestine without perforation or abscess without bleeding: Secondary | ICD-10-CM

## 2021-11-29 DIAGNOSIS — M9902 Segmental and somatic dysfunction of thoracic region: Secondary | ICD-10-CM | POA: Diagnosis not present

## 2021-11-29 DIAGNOSIS — M5032 Other cervical disc degeneration, mid-cervical region, unspecified level: Secondary | ICD-10-CM | POA: Diagnosis not present

## 2021-11-29 DIAGNOSIS — M5137 Other intervertebral disc degeneration, lumbosacral region: Secondary | ICD-10-CM | POA: Diagnosis not present

## 2021-11-29 DIAGNOSIS — M9905 Segmental and somatic dysfunction of pelvic region: Secondary | ICD-10-CM | POA: Diagnosis not present

## 2021-11-29 DIAGNOSIS — S29012A Strain of muscle and tendon of back wall of thorax, initial encounter: Secondary | ICD-10-CM | POA: Diagnosis not present

## 2021-11-29 DIAGNOSIS — Z86010 Personal history of colonic polyps: Secondary | ICD-10-CM | POA: Diagnosis not present

## 2021-11-29 DIAGNOSIS — M9903 Segmental and somatic dysfunction of lumbar region: Secondary | ICD-10-CM | POA: Diagnosis not present

## 2021-11-29 NOTE — Patient Instructions (Addendum)
You have been scheduled for a colonoscopy. Please follow written instructions given to you at your visit today.  ?Please pick up your prep supplies at the pharmacy within the next 1-3 days. ?If you use inhalers (even only as needed), please bring them with you on the day of your procedure.  ? ?Diverticulosis ?Diverticulosis is a condition that develops when small pouches (diverticula) form in the wall of the large intestine (colon). The colon is where water is absorbed and stool (feces) is formed. The pouches form when the inside layer of the colon pushes through weak spots in the outer layers of the colon. You may have a few pouches or many of them. ?The pouches usually do not cause problems unless they become inflamed or infected. When this happens, the condition is called diverticulitis- this is left lower quadrant pain, diarrhea, fever, chills, nausea or vomiting.  If this occurs please call the office or go to the hospital. ?Sometimes these patches without inflammation can also have painless bleeding associated with them, if this happens please call the office or go to the hospital. ?Preventing constipation and increasing fiber can help reduce diverticula and prevent complications. ?Even if you feel you have a high-fiber diet, suggest getting on Benefiber 2-3 times daily. ?What increases the risk? ?The following factors may make you more likely to develop this condition: ?Being older than age 48. Your risk for this condition increases with age. Diverticulosis is rare among people younger than age 68. By age 57, many people have it. ?Eating a low-fiber diet. ?Having frequent constipation. ?Being overweight. ?Not getting enough exercise. ?Smoking. ?Taking over-the-counter pain medicines, like aspirin and ibuprofen.- can increase a flare.  ?Having a family history of diverticulosis. ?How is this diagnosed? ?Because diverticulosis usually has no symptoms, it is most often diagnosed during an exam for other colon  problems. The condition may be diagnosed by: ?Using a flexible scope to examine the colon (colonoscopy). ?Taking an X-ray of the colon after dye has been put into the colon (barium enema). ?Having a CT scan. ?How is this treated? ?You may not need treatment for this condition. Your health care provider may recommend treatment to prevent problems. You may need treatment if you have symptoms or if you previously had diverticulitis. Treatment may include: ?Eating a high-fiber diet. ?Taking a fiber supplement. ?Taking a live bacteria supplement (probiotic). ?Taking medicine to relax your colon. ?Contact a health care provider if you: ?Have pain in your abdomen. ?Have bloating. ?Have cramps. ?Have not had a bowel movement in 3 days. ?Get help right away if: ?Your pain gets worse. ?Your bloating becomes very bad. ?You have a fever or chills, and your symptoms suddenly get worse. ?You vomit. ?You have bowel movements that are bloody or black. ?You have bleeding from your rectum. ?Summary ?Diverticulosis is a condition that develops when small pouches (diverticula) form in the wall of the large intestine (colon). ?You may have a few pouches or many of them. ?This condition is most often diagnosed during an exam for other colon problems. ?Treatment may include increasing the fiber in your diet, taking supplements, or taking medicines. ?This information is not intended to replace advice given to you by your health care provider. Make sure you discuss any questions you have with your health care provider. ?Document Revised: 04/02/2019 Document Reviewed: 04/02/2019 ? ?Due to recent changes in healthcare laws, you may see the results of your imaging and laboratory studies on MyChart before your provider has had a chance to  review them.  We understand that in some cases there may be results that are confusing or concerning to you. Not all laboratory results come back in the same time frame and the provider may be waiting for  multiple results in order to interpret others.  Please give [place] 48 hours in order for your provider to thoroughly review all the results before contacting the office for clarification of your results.   ? ?Thank you for choosing [facility] ? ?Amanda Collier,PA-C  ?

## 2021-11-29 NOTE — Progress Notes (Signed)
Attending Physician's Attestation   I have reviewed the chart.   I agree with the Advanced Practitioner's note, impression, and recommendations with any updates as below.    Sander Speckman Mansouraty, MD [facility] Advanced Endoscopy Office # [phone]  

## 2021-11-30 DIAGNOSIS — H35033 Hypertensive retinopathy, bilateral: Secondary | ICD-10-CM | POA: Diagnosis not present

## 2021-11-30 DIAGNOSIS — H34812 Central retinal vein occlusion, left eye, with macular edema: Secondary | ICD-10-CM | POA: Diagnosis not present

## 2021-11-30 DIAGNOSIS — H2513 Age-related nuclear cataract, bilateral: Secondary | ICD-10-CM | POA: Diagnosis not present

## 2021-11-30 DIAGNOSIS — H43813 Vitreous degeneration, bilateral: Secondary | ICD-10-CM | POA: Diagnosis not present

## 2021-11-30 DIAGNOSIS — H35371 Puckering of macula, right eye: Secondary | ICD-10-CM | POA: Diagnosis not present

## 2021-12-04 DIAGNOSIS — H401111 Primary open-angle glaucoma, right eye, mild stage: Secondary | ICD-10-CM | POA: Diagnosis not present

## 2021-12-04 DIAGNOSIS — H401122 Primary open-angle glaucoma, left eye, moderate stage: Secondary | ICD-10-CM | POA: Diagnosis not present

## 2021-12-12 DIAGNOSIS — Z481 Encounter for planned postprocedural wound closure: Secondary | ICD-10-CM | POA: Diagnosis not present

## 2021-12-12 DIAGNOSIS — C4491 Basal cell carcinoma of skin, unspecified: Secondary | ICD-10-CM | POA: Diagnosis not present

## 2021-12-12 DIAGNOSIS — C44319 Basal cell carcinoma of skin of other parts of face: Secondary | ICD-10-CM | POA: Diagnosis not present

## 2021-12-14 ENCOUNTER — Ambulatory Visit (INDEPENDENT_AMBULATORY_CARE_PROVIDER_SITE_OTHER): Payer: Medicare Other

## 2021-12-14 VITALS — BP 138/84 | HR 56 | Temp 98.1°F | Resp 18 | Wt 207.0 lb

## 2021-12-14 DIAGNOSIS — J4540 Moderate persistent asthma, uncomplicated: Secondary | ICD-10-CM

## 2021-12-14 DIAGNOSIS — M5137 Other intervertebral disc degeneration, lumbosacral region: Secondary | ICD-10-CM | POA: Diagnosis not present

## 2021-12-14 DIAGNOSIS — M9902 Segmental and somatic dysfunction of thoracic region: Secondary | ICD-10-CM | POA: Diagnosis not present

## 2021-12-14 DIAGNOSIS — M5136 Other intervertebral disc degeneration, lumbar region: Secondary | ICD-10-CM | POA: Diagnosis not present

## 2021-12-14 DIAGNOSIS — M5032 Other cervical disc degeneration, mid-cervical region, unspecified level: Secondary | ICD-10-CM | POA: Diagnosis not present

## 2021-12-14 DIAGNOSIS — M9905 Segmental and somatic dysfunction of pelvic region: Secondary | ICD-10-CM | POA: Diagnosis not present

## 2021-12-14 DIAGNOSIS — M9903 Segmental and somatic dysfunction of lumbar region: Secondary | ICD-10-CM | POA: Diagnosis not present

## 2021-12-14 DIAGNOSIS — S29012A Strain of muscle and tendon of back wall of thorax, initial encounter: Secondary | ICD-10-CM | POA: Diagnosis not present

## 2021-12-14 DIAGNOSIS — M9901 Segmental and somatic dysfunction of cervical region: Secondary | ICD-10-CM | POA: Diagnosis not present

## 2021-12-14 MED ORDER — MEPOLIZUMAB 100 MG ~~LOC~~ SOLR
100.0000 mg | Freq: Once | SUBCUTANEOUS | Status: AC
  Administered 2021-12-14: 100 mg via SUBCUTANEOUS
  Filled 2021-12-14: qty 1

## 2021-12-14 NOTE — Progress Notes (Signed)
Diagnosis: Asthma ? ?Provider:  Marshell Garfinkel, MD ? ?Procedure: Infusion ? ?Nucala (Mepolizumab), Dose: 100 mg, Site: subcutaneous, Number of injections: 1 ? ?Discharge: Condition: Good, Destination: Home . AVS provided to patient.  ? ?Performed by:  Neyra Pettie, RN  ? ? ? ?  ?

## 2021-12-19 ENCOUNTER — Ambulatory Visit
Admission: RE | Admit: 2021-12-19 | Discharge: 2021-12-19 | Disposition: A | Discharge status: Home / Self Care | Payer: Medicare Other | Source: Ambulatory Visit | Attending: Family Medicine | Admitting: Family Medicine

## 2021-12-19 DIAGNOSIS — Z1231 Encounter for screening mammogram for malignant neoplasm of breast: Secondary | ICD-10-CM | POA: Diagnosis not present

## 2021-12-26 ENCOUNTER — Encounter: Payer: Self-pay | Admitting: Emergency Medicine

## 2021-12-27 DIAGNOSIS — C44619 Basal cell carcinoma of skin of left upper limb, including shoulder: Secondary | ICD-10-CM | POA: Diagnosis not present

## 2021-12-28 ENCOUNTER — Encounter: Payer: Self-pay | Admitting: Certified Registered Nurse Anesthetist

## 2022-01-03 ENCOUNTER — Encounter: Payer: Self-pay | Admitting: Gastroenterology

## 2022-01-03 ENCOUNTER — Ambulatory Visit (AMBULATORY_SURGERY_CENTER): Payer: Medicare Other | Admitting: Gastroenterology

## 2022-01-03 VITALS — BP 132/64 | HR 51 | Temp 97.3°F | Resp 13 | Ht 62.0 in | Wt 206.0 lb

## 2022-01-03 DIAGNOSIS — I10 Essential (primary) hypertension: Secondary | ICD-10-CM | POA: Diagnosis not present

## 2022-01-03 DIAGNOSIS — D128 Benign neoplasm of rectum: Secondary | ICD-10-CM | POA: Diagnosis not present

## 2022-01-03 DIAGNOSIS — D122 Benign neoplasm of ascending colon: Secondary | ICD-10-CM

## 2022-01-03 DIAGNOSIS — D123 Benign neoplasm of transverse colon: Secondary | ICD-10-CM

## 2022-01-03 DIAGNOSIS — Z86010 Personal history of colonic polyps: Secondary | ICD-10-CM | POA: Diagnosis not present

## 2022-01-03 DIAGNOSIS — D125 Benign neoplasm of sigmoid colon: Secondary | ICD-10-CM | POA: Diagnosis not present

## 2022-01-03 DIAGNOSIS — F32A Depression, unspecified: Secondary | ICD-10-CM | POA: Diagnosis not present

## 2022-01-03 DIAGNOSIS — D127 Benign neoplasm of rectosigmoid junction: Secondary | ICD-10-CM | POA: Diagnosis not present

## 2022-01-03 MED ORDER — SODIUM CHLORIDE 0.9 % IV SOLN: 500.0000 mL | Freq: Once | INTRAVENOUS | Status: DC

## 2022-01-03 NOTE — Progress Notes (Signed)
1150 IV infiltrated with 24 gauge started in right hand, vss ?

## 2022-01-03 NOTE — Op Note (Signed)
[facility] ?Patient Name: Ana Hughes ?Procedure Date: 01/03/2022 11:42 AM ?MRN: [id number] ?Endoscopist: Gabriel Mansouraty , MD ?Age: 71 ?Referring MD:  ?Date of Birth: 01/15/1951 ?Gender: Female ?Account #: [id number] ?Procedure:                Colonoscopy ?Indications:              Surveillance: Personal history of adenomatous  ?                          polyps on last colonoscopy > 5 years ago ?Medicines:                Monitored Anesthesia Care ?Procedure:                Pre-Anesthesia Assessment: ?                          - Prior to the procedure, a History and Physical  ?                          was performed, and patient medications and  ?                          allergies were reviewed. The patient's tolerance of  ?                          previous anesthesia was also reviewed. The risks  ?                          and benefits of the procedure and the sedation  ?                          options and risks were discussed with the patient.  ?                          All questions were answered, and informed consent  ?                          was obtained. Prior Anticoagulants: The patient has  ?                          taken no previous anticoagulant or antiplatelet  ?                          agents except for NSAID medication. ASA Grade  ?                          Assessment: II - A patient with mild systemic  ?                          disease. After reviewing the risks and benefits,  ?                          the patient was deemed in satisfactory condition to  ?                            undergo the procedure. ?                          After obtaining informed consent, the colonoscope  ?                          was passed under direct vision. Throughout the  ?                          procedure, the patient's blood pressure, pulse, and  ?                          oxygen saturations were monitored continuously. The  ?                          CF [id number] #[id number] was introduced  through the anus  ?                          and advanced to the 5 cm into the ileum. The  ?                          colonoscopy was performed without difficulty. The  ?                          patient tolerated the procedure. The quality of the  ?                          bowel preparation was adequate. The terminal ileum,  ?                          ileocecal valve, appendiceal orifice, and rectum  ?                          were photographed. ?Scope In: 11:53:33 AM ?Scope Out: 12:13:23 PM ?Scope Withdrawal Time: 0 hours 16 minutes 2 seconds  ?Total Procedure Duration: 0 hours 19 minutes 50 seconds  ?Findings:                 Skin tags were found on perianal exam. ?                          The digital rectal exam findings include  ?                          hemorrhoids. Pertinent negatives include no  ?                          palpable rectal lesions. ?                          The terminal ileum and ileocecal valve appeared  ?                          normal. ?  Ten sessile polyps were found in the rectum (1),  ?                          recto-sigmoid colon (1), sigmoid colon (2),  ?                          transverse colon (4), hepatic flexure (1) and  ?                          ascending colon (1). The polyps were 2 to 7 mm in  ?                          size. These polyps were removed with a cold snare.  ?                          Resection and retrieval were complete. ?                          Many small-mouthed diverticula were found in the  ?                          recto-sigmoid colon, sigmoid colon and descending  ?                          colon. ?                          Normal mucosa was found in the entire colon  ?                          otherwise. ?                          Non-bleeding non-thrombosed external and internal  ?                          hemorrhoids were found during retroflexion, during  ?                          perianal exam and during digital exam.  The  ?                          hemorrhoids were Grade II (internal hemorrhoids  ?                          that prolapse but reduce spontaneously). ?Complications:            No immediate complications. ?Estimated Blood Loss:     Estimated blood loss was minimal. ?Impression:               - Perianal skin tags found on perianal exam. ?                          - Hemorrhoids found on digital rectal exam. ?                          -   The examined portion of the ileum was normal. ?                          - Ten 2 to 7 mm polyps in the rectum, at the  ?                          recto-sigmoid colon, in the sigmoid colon, in the  ?                          transverse colon, at the hepatic flexure and in the  ?                          ascending colon, removed with a cold snare.  ?                          Resected and retrieved. ?                          - Diverticulosis in the recto-sigmoid colon, in the  ?                          sigmoid colon and in the descending colon. ?                          - Normal mucosa in the entire examined colon  ?                          otherwise. ?                          - Non-bleeding non-thrombosed external and internal  ?                          hemorrhoids. ?Recommendation:           - The patient will be observed post-procedure,  ?                          until all discharge criteria are met. ?                          - Discharge patient to home. ?                          - Patient has a contact number available for  ?                          emergencies. The signs and symptoms of potential  ?                          delayed complications were discussed with the  ?                          patient. Return to normal activities tomorrow.  ?                            Written discharge instructions were provided to the  ?                          patient. ?                          - High fiber diet. ?                          - Use FiberCon 1-2 tablets PO daily. ?                           - Continue present medications. ?                          - Await pathology results. ?                          - Repeat colonoscopy in 1 year for surveillance  ?                          based on pathology results if all tissue returns as  ?                          adenomatous. If any hyperplastic polyps are found  ?                          then 3-year colonoscopy will be recommended. ?                          - The findings and recommendations were discussed  ?                          with the patient. ?                          - The findings and recommendations were discussed  ?                          with the patient's family. ?Justice Britain, MD ?01/03/2022 12:19:30 PM ?

## 2022-01-03 NOTE — Progress Notes (Signed)
Called to room to assist during endoscopic procedure.  Patient ID and intended procedure confirmed with present staff. Received instructions for my participation in the procedure from the performing physician.  

## 2022-01-03 NOTE — Patient Instructions (Signed)
Resume previous medications.  10 polyps removed and sent to pathology.  Await results for final recommendations.   ? ?Handouts on findings given to patient. (Diverticulosis, hemorrhoids and polyps)   ? ?Use FiberCon 1-2 tablets daily.  High Fiber diet.  ? ?YOU HAD AN ENDOSCOPIC PROCEDURE TODAY AT [facility]:   Refer to the procedure report that was given to you for any specific questions about what was found during the examination.  If the procedure report does not answer your questions, please call your gastroenterologist to clarify.  If you requested that your care partner not be given the details of your procedure findings, then the procedure report has been included in a sealed envelope for you to review at your convenience later. ? ?YOU SHOULD EXPECT: Some feelings of bloating in the abdomen. Passage of more gas than usual.  Walking can help get rid of the air that was put into your GI tract during the procedure and reduce the bloating. If you had a lower endoscopy (such as a colonoscopy or flexible sigmoidoscopy) you may notice spotting of blood in your stool or on the toilet paper. If you underwent a bowel prep for your procedure, you may not have a normal bowel movement for a few days. ? ?Please Note:  You might notice some irritation and congestion in your nose or some drainage.  This is from the oxygen used during your procedure.  There is no need for concern and it should clear up in a day or so. ? ?SYMPTOMS TO REPORT IMMEDIATELY: ? ?Following lower endoscopy (colonoscopy or flexible sigmoidoscopy): ? Excessive amounts of blood in the stool ? Significant tenderness or worsening of abdominal pains ? Swelling of the abdomen that is new, acute ? Fever of 100?F or higher ? ? ?For urgent or emergent issues, a gastroenterologist can be reached at any hour by calling [phone]. ?Do not use MyChart messaging for urgent concerns.  ? ? ?DIET:  We do recommend a small meal at first, but then  you may proceed to your regular diet.  Drink plenty of fluids but you should avoid alcoholic beverages for 24 hours. ? ?ACTIVITY:  You should plan to take it easy for the rest of today and you should NOT DRIVE or use heavy machinery until tomorrow (because of the sedation medicines used during the test).   ? ?FOLLOW UP: ?Our staff will call the number listed on your records 48-72 hours following your procedure to check on you and address any questions or concerns that you may have regarding the information given to you following your procedure. If we do not reach you, we will leave a message.  We will attempt to reach you two times.  During this call, we will ask if you have developed any symptoms of COVID 19. If you develop any symptoms (ie: fever, flu-like symptoms, shortness of breath, cough etc.) before then, please call [phone].  If you test positive for Covid 19 in the 2 weeks post procedure, please call and report this information to [place].   ? ?If any biopsies were taken you will be contacted by phone or by letter within the next 1-3 weeks.  Please call us at [phone] if you have not heard about the biopsies in 3 weeks.  ? ? ?SIGNATURES/CONFIDENTIALITY: ?You and/or your care partner have signed paperwork which will be entered into your electronic medical record.  These signatures attest to the fact that that the information above on your  After Visit Summary has been reviewed and is understood.  Full responsibility of the confidentiality of this discharge information lies with you and/or your care-partner.  ?

## 2022-01-03 NOTE — Progress Notes (Signed)
? ?GASTROENTEROLOGY PROCEDURE H&P NOTE  ? ?Primary Care Physician: ?Martinique, Betty G, MD ? ?HPI: ?Ana Hughes is a 71 y.o. female who presents for Colonoscopy for screening. ? ?Past Medical History:  ?Diagnosis Date  ? Arthritis   ? Asthma   ? Basal cell carcinoma   ? Nose, forehead & left shoulder  ? Depression   ? Glaucoma   ? History of colon polyps   ? Hypertension   ? ?Past Surgical History:  ?Procedure Laterality Date  ? [facility]  ? MOHS SURGERY    ? REPLACEMENT TOTAL KNEE BILATERAL Bilateral   ? left knee, 2005, right knee 2004  ? TOTAL HIP ARTHROPLASTY Bilateral 012, right hip, 2013 left hip  ? ?Current Outpatient Medications  ?Medication Sig Dispense Refill  ? amLODipine (NORVASC) 5 MG tablet Take 1 tablet (5 mg total) by mouth daily. 90 tablet 3  ? bimatoprost (LUMIGAN) 0.01 % SOLN Place 1 drop into both eyes at bedtime.    ? dorzolamide (TRUSOPT) 2 % ophthalmic solution Place 1 drop into both eyes 3 (three) times daily.    ? DULERA 200-5 MCG/ACT AERO USE 2 INHALATIONS TWICE A DAY (MUST HAVE OFFICE VISIT FOR FURTHER REFILLS) 39 g 3  ? FLUoxetine (PROZAC) 40 MG capsule Take 1 capsule (40 mg total) by mouth daily. 90 capsule 2  ? gabapentin (NEURONTIN) 300 MG capsule TAKE ONE CAPSULE BY MOUTH DAILY AS NEEDED 30 capsule 3  ? meloxicam (MOBIC) 15 MG tablet TAKE ONE TABLET BY MOUTH DAILY AS NEEDED; 30 tablet 1  ? montelukast (SINGULAIR) 10 MG tablet Take 1 tablet (10 mg total) by mouth at bedtime. 90 tablet 1  ? timolol (TIMOPTIC-XR) 0.25 % ophthalmic gel-forming Place 1 drop into the left eye daily.    ? Aflibercept (EYLEA) 2 MG/0.05ML SOSY by Intravitreal route. Given by eye doctor.    ? LORazepam (ATIVAN) 0.5 MG tablet Take 0.5 mg by mouth daily as needed.    ? nystatin (MYCOSTATIN) 100000 UNIT/ML suspension Take 5 mLs (500,000 Units total) by mouth 3 (three) times daily. 473 mL 1  ? ?Current Facility-Administered Medications  ?Medication Dose Route Frequency Provider Last  Rate Last Admin  ? 0.9 %  sodium chloride infusion  500 mL Intravenous Once Mansouraty, Telford Nab., MD      ? ? ?Current Outpatient Medications:  ?  amLODipine (NORVASC) 5 MG tablet, Take 1 tablet (5 mg total) by mouth daily., Disp: 90 tablet, Rfl: 3 ?  bimatoprost (LUMIGAN) 0.01 % SOLN, Place 1 drop into both eyes at bedtime., Disp: , Rfl:  ?  dorzolamide (TRUSOPT) 2 % ophthalmic solution, Place 1 drop into both eyes 3 (three) times daily., Disp: , Rfl:  ?  DULERA 200-5 MCG/ACT AERO, USE 2 INHALATIONS TWICE A DAY (MUST HAVE OFFICE VISIT FOR FURTHER REFILLS), Disp: 39 g, Rfl: 3 ?  FLUoxetine (PROZAC) 40 MG capsule, Take 1 capsule (40 mg total) by mouth daily., Disp: 90 capsule, Rfl: 2 ?  gabapentin (NEURONTIN) 300 MG capsule, TAKE ONE CAPSULE BY MOUTH DAILY AS NEEDED, Disp: 30 capsule, Rfl: 3 ?  meloxicam (MOBIC) 15 MG tablet, TAKE ONE TABLET BY MOUTH DAILY AS NEEDED;, Disp: 30 tablet, Rfl: 1 ?  montelukast (SINGULAIR) 10 MG tablet, Take 1 tablet (10 mg total) by mouth at bedtime., Disp: 90 tablet, Rfl: 1 ?  timolol (TIMOPTIC-XR) 0.25 % ophthalmic gel-forming, Place 1 drop into the left eye daily., Disp: , Rfl:  ?  Aflibercept (  EYLEA) 2 MG/0.05ML SOSY, by Intravitreal route. Given by eye doctor., Disp: , Rfl:  ?  LORazepam (ATIVAN) 0.5 MG tablet, Take 0.5 mg by mouth daily as needed., Disp: , Rfl:  ?  nystatin (MYCOSTATIN) 100000 UNIT/ML suspension, Take 5 mLs (500,000 Units total) by mouth 3 (three) times daily., Disp: 473 mL, Rfl: 1 ? ?Current Facility-Administered Medications:  ?  0.9 %  sodium chloride infusion, 500 mL, Intravenous, Once, Mansouraty, Telford Nab., MD ?Allergies  ?Allergen Reactions  ? Simbrinza [Brinzolamide-Brimonidine]   ? ?Family History  ?Problem Relation Age of Onset  ? Lung cancer Mother   ? Hypertension Mother   ? Brain cancer Mother   ? Prostate cancer Father   ? Breast cancer Sister 57  ? Depression Daughter   ? Drug abuse Daughter   ? Depression Daughter   ? Drug abuse Daughter   ?  Heart attack Daughter   ? Depression Daughter   ? Drug abuse Daughter   ? Mental illness Daughter   ? Colon cancer Neg Hx   ? Stomach cancer Neg Hx   ? Esophageal cancer Neg Hx   ? Colon polyps Neg Hx   ? Pancreatic cancer Neg Hx   ? Liver disease Neg Hx   ? ?Social History  ? ?Socioeconomic History  ? Marital status: Widowed  ?  Spouse name: Not on file  ? Number of children: 3  ? Years of education: Not on file  ? Highest education level: Not on file  ?Occupational History  ? Occupation: Retired  ?Tobacco Use  ? Smoking status: Never  ? Smokeless tobacco: Never  ?Vaping Use  ? Vaping Use: Never used  ?Substance and Sexual Activity  ? Alcohol use: Yes  ?  Comment: socially  ? Drug use: Never  ? Sexual activity: Not Currently  ?Other Topics Concern  ? Not on file  ?Social History Narrative  ? Not on file  ? ?Social Determinants of Health  ? ?Financial Resource Strain: Low Risk   ? Difficulty of Paying Living Expenses: Not hard at all  ?Food Insecurity: No Food Insecurity  ? Worried About Charity fundraiser in the Last Year: Never true  ? Ran Out of Food in the Last Year: Never true  ?Transportation Needs: No Transportation Needs  ? Lack of Transportation (Medical): No  ? Lack of Transportation (Non-Medical): No  ?Physical Activity: Insufficiently Active  ? Days of Exercise per Week: 2 days  ? Minutes of Exercise per Session: 10 min  ?Stress: No Stress Concern Present  ? Feeling of Stress : Not at all  ?Social Connections: Socially Isolated  ? Frequency of Communication with Friends and Family: More than three times a week  ? Frequency of Social Gatherings with Friends and Family: More than three times a week  ? Attends Religious Services: Never  ? Active Member of Clubs or Organizations: No  ? Attends Archivist Meetings: Never  ? Marital Status: Widowed  ?Intimate Partner Violence: Unknown  ? Fear of Current or Ex-Partner: No  ? Emotionally Abused: No  ? Physically Abused: No  ? Sexually Abused: Not on  file  ? ? ?Physical Exam: ?Today's Vitals  ? 01/03/22 1045  ?BP: 124/72  ?Pulse: (!) 57  ?Temp: (!) 97.3 ?F (36.3 ?C)  ?TempSrc: Temporal  ?SpO2: 96%  ?Weight: 206 lb (93.4 kg)  ?Height: '5\' 2"'$  (1.575 m)  ? ?Body mass index is 37.68 kg/m?. ?GEN: NAD ?EYE: Sclerae anicteric ?ENT: MMM ?CV:  Non-tachycardic ?GI: Soft, NT/ND ?NEURO:  Alert & Oriented x 3 ? ?Lab Results: ?No results for input(s): WBC, HGB, HCT, PLT in the last 72 hours. ?BMET ?No results for input(s): NA, K, CL, CO2, GLUCOSE, BUN, CREATININE, CALCIUM in the last 72 hours. ?LFT ?No results for input(s): PROT, ALBUMIN, AST, ALT, ALKPHOS, BILITOT, BILIDIR, IBILI in the last 72 hours. ?PT/INR ?No results for input(s): LABPROT, INR in the last 72 hours. ? ? ?Impression / Plan: ?This is a 71 y.o.female who presents for Colonoscopy for screening. ? ?The risks and benefits of endoscopic evaluation/treatment were discussed with the patient and/or family; these include but are not limited to the risk of perforation, infection, bleeding, missed lesions, lack of diagnosis, severe illness requiring hospitalization, as well as anesthesia and sedation related illnesses.  The patient's history has been reviewed, patient examined, no change in status, and deemed stable for procedure.  The patient and/or family is agreeable to proceed.  ? ? ?Justice Britain, MD ?[facility] ?Advanced Endoscopy ?Office # [phone] ? ?

## 2022-01-03 NOTE — Progress Notes (Signed)
Report given to PACU, vss 

## 2022-01-03 NOTE — Progress Notes (Signed)
Pt's states no medical or surgical changes since previsit or office visit.  ? ? ?Basal cell cancer removal on left shoulder and forehead. One week ago. ?

## 2022-01-04 DIAGNOSIS — M9905 Segmental and somatic dysfunction of pelvic region: Secondary | ICD-10-CM | POA: Diagnosis not present

## 2022-01-04 DIAGNOSIS — M5032 Other cervical disc degeneration, mid-cervical region, unspecified level: Secondary | ICD-10-CM | POA: Diagnosis not present

## 2022-01-04 DIAGNOSIS — M9903 Segmental and somatic dysfunction of lumbar region: Secondary | ICD-10-CM | POA: Diagnosis not present

## 2022-01-04 DIAGNOSIS — M9902 Segmental and somatic dysfunction of thoracic region: Secondary | ICD-10-CM | POA: Diagnosis not present

## 2022-01-04 DIAGNOSIS — M5137 Other intervertebral disc degeneration, lumbosacral region: Secondary | ICD-10-CM | POA: Diagnosis not present

## 2022-01-04 DIAGNOSIS — S29012A Strain of muscle and tendon of back wall of thorax, initial encounter: Secondary | ICD-10-CM | POA: Diagnosis not present

## 2022-01-04 DIAGNOSIS — M9901 Segmental and somatic dysfunction of cervical region: Secondary | ICD-10-CM | POA: Diagnosis not present

## 2022-01-04 DIAGNOSIS — M5136 Other intervertebral disc degeneration, lumbar region: Secondary | ICD-10-CM | POA: Diagnosis not present

## 2022-01-05 ENCOUNTER — Telehealth: Payer: Self-pay

## 2022-01-05 NOTE — Telephone Encounter (Signed)
Called [phone] and left a message we tried to reach pt for a follow up call. maw  ?

## 2022-01-05 NOTE — Telephone Encounter (Signed)
?  Follow up Call- ? ? ?  01/03/2022  ? 10:49 AM  ?Call back number  ?Post procedure Call Back phone  # [phone]  ?Permission to leave phone message Yes  ?  ? ?Patient questions: ? ?Do you have a fever, pain , or abdominal swelling? No. ?Pain Score  0 * ? ?Have you tolerated food without any problems? Yes.   ? ?Have you been able to return to your normal activities? Yes.   ? ?Do you have any questions about your discharge instructions: ?Diet   No. ?Medications  No. ?Follow up visit  No. ? ?Do you have questions or concerns about your Care? No. ? ?Actions: ?* If pain score is 4 or above: ?No action needed, pain <4. ? ? ?

## 2022-01-08 ENCOUNTER — Encounter: Payer: Self-pay | Admitting: Gastroenterology

## 2022-01-11 ENCOUNTER — Ambulatory Visit (INDEPENDENT_AMBULATORY_CARE_PROVIDER_SITE_OTHER): Payer: Medicare Other

## 2022-01-11 VITALS — BP 139/81 | HR 53 | Temp 98.7°F | Resp 94 | Ht 62.0 in | Wt 207.8 lb

## 2022-01-11 DIAGNOSIS — J4540 Moderate persistent asthma, uncomplicated: Secondary | ICD-10-CM

## 2022-01-11 MED ORDER — MEPOLIZUMAB 100 MG ~~LOC~~ SOLR
100.0000 mg | Freq: Once | SUBCUTANEOUS | Status: AC
  Administered 2022-01-11: 100 mg via SUBCUTANEOUS
  Filled 2022-01-11: qty 1

## 2022-01-11 NOTE — Progress Notes (Signed)
Diagnosis: Asthma ? ?Provider:  Marshell Garfinkel, MD ? ?Procedure: Injection ? ?Nucala (Mepolizumab), Dose: 100 mg, Site: subcutaneous, Number of injections: 1 ? ?Discharge: Condition: Good, Destination: Home . AVS declined. ? ?Performed by:  Koren Shiver, RN  ? ? ? ?  ?

## 2022-01-18 DIAGNOSIS — L570 Actinic keratosis: Secondary | ICD-10-CM | POA: Diagnosis not present

## 2022-01-24 ENCOUNTER — Other Ambulatory Visit: Payer: Self-pay | Admitting: Family Medicine

## 2022-01-24 DIAGNOSIS — M25571 Pain in right ankle and joints of right foot: Secondary | ICD-10-CM

## 2022-01-25 DIAGNOSIS — H43821 Vitreomacular adhesion, right eye: Secondary | ICD-10-CM | POA: Diagnosis not present

## 2022-01-25 DIAGNOSIS — H34812 Central retinal vein occlusion, left eye, with macular edema: Secondary | ICD-10-CM | POA: Diagnosis not present

## 2022-01-25 DIAGNOSIS — H43813 Vitreous degeneration, bilateral: Secondary | ICD-10-CM | POA: Diagnosis not present

## 2022-01-25 DIAGNOSIS — H35371 Puckering of macula, right eye: Secondary | ICD-10-CM | POA: Diagnosis not present

## 2022-02-01 DIAGNOSIS — M5137 Other intervertebral disc degeneration, lumbosacral region: Secondary | ICD-10-CM | POA: Diagnosis not present

## 2022-02-01 DIAGNOSIS — M9905 Segmental and somatic dysfunction of pelvic region: Secondary | ICD-10-CM | POA: Diagnosis not present

## 2022-02-01 DIAGNOSIS — M9903 Segmental and somatic dysfunction of lumbar region: Secondary | ICD-10-CM | POA: Diagnosis not present

## 2022-02-01 DIAGNOSIS — M9901 Segmental and somatic dysfunction of cervical region: Secondary | ICD-10-CM | POA: Diagnosis not present

## 2022-02-01 DIAGNOSIS — M9902 Segmental and somatic dysfunction of thoracic region: Secondary | ICD-10-CM | POA: Diagnosis not present

## 2022-02-01 DIAGNOSIS — S29012A Strain of muscle and tendon of back wall of thorax, initial encounter: Secondary | ICD-10-CM | POA: Diagnosis not present

## 2022-02-01 DIAGNOSIS — M5032 Other cervical disc degeneration, mid-cervical region, unspecified level: Secondary | ICD-10-CM | POA: Diagnosis not present

## 2022-02-01 DIAGNOSIS — M5136 Other intervertebral disc degeneration, lumbar region: Secondary | ICD-10-CM | POA: Diagnosis not present

## 2022-02-02 ENCOUNTER — Other Ambulatory Visit: Payer: Self-pay | Admitting: Family Medicine

## 2022-02-05 DIAGNOSIS — H02052 Trichiasis without entropian right lower eyelid: Secondary | ICD-10-CM | POA: Diagnosis not present

## 2022-02-05 DIAGNOSIS — H16141 Punctate keratitis, right eye: Secondary | ICD-10-CM | POA: Diagnosis not present

## 2022-02-08 ENCOUNTER — Ambulatory Visit (INDEPENDENT_AMBULATORY_CARE_PROVIDER_SITE_OTHER): Payer: Medicare Other

## 2022-02-08 VITALS — BP 144/78 | HR 56 | Temp 99.0°F | Resp 18 | Ht 62.0 in | Wt 209.0 lb

## 2022-02-08 DIAGNOSIS — J4540 Moderate persistent asthma, uncomplicated: Secondary | ICD-10-CM | POA: Diagnosis not present

## 2022-02-08 MED ORDER — MEPOLIZUMAB 100 MG ~~LOC~~ SOLR
100.0000 mg | Freq: Once | SUBCUTANEOUS | Status: AC
  Administered 2022-02-08: 100 mg via SUBCUTANEOUS
  Filled 2022-02-08: qty 1

## 2022-02-08 NOTE — Progress Notes (Signed)
Diagnosis: Asthma  Provider:  Marshell Garfinkel, MD  Procedure: Injection  Nucala (Mepolizumab), Dose: 100 mg, Site: subcutaneous, Number of injections: 1  Discharge: Condition: Good, Destination: Home . AVS provided to patient.   Performed by:  Adelina Mings, LPN

## 2022-02-18 ENCOUNTER — Other Ambulatory Visit: Payer: Self-pay | Admitting: Family Medicine

## 2022-02-18 DIAGNOSIS — F331 Major depressive disorder, recurrent, moderate: Secondary | ICD-10-CM

## 2022-02-21 ENCOUNTER — Other Ambulatory Visit: Payer: Self-pay | Admitting: Family Medicine

## 2022-02-21 DIAGNOSIS — G8929 Other chronic pain: Secondary | ICD-10-CM

## 2022-02-21 DIAGNOSIS — M5442 Lumbago with sciatica, left side: Secondary | ICD-10-CM

## 2022-03-05 DIAGNOSIS — H2513 Age-related nuclear cataract, bilateral: Secondary | ICD-10-CM | POA: Diagnosis not present

## 2022-03-05 DIAGNOSIS — H5203 Hypermetropia, bilateral: Secondary | ICD-10-CM | POA: Diagnosis not present

## 2022-03-05 DIAGNOSIS — H524 Presbyopia: Secondary | ICD-10-CM | POA: Diagnosis not present

## 2022-03-05 DIAGNOSIS — H25012 Cortical age-related cataract, left eye: Secondary | ICD-10-CM | POA: Diagnosis not present

## 2022-03-05 DIAGNOSIS — H401122 Primary open-angle glaucoma, left eye, moderate stage: Secondary | ICD-10-CM | POA: Diagnosis not present

## 2022-03-05 DIAGNOSIS — H401111 Primary open-angle glaucoma, right eye, mild stage: Secondary | ICD-10-CM | POA: Diagnosis not present

## 2022-03-08 ENCOUNTER — Ambulatory Visit (INDEPENDENT_AMBULATORY_CARE_PROVIDER_SITE_OTHER): Payer: Medicare Other

## 2022-03-08 VITALS — BP 129/77 | HR 50 | Temp 98.3°F | Resp 18 | Ht 62.0 in | Wt 210.8 lb

## 2022-03-08 DIAGNOSIS — J4540 Moderate persistent asthma, uncomplicated: Secondary | ICD-10-CM | POA: Diagnosis not present

## 2022-03-08 DIAGNOSIS — M9901 Segmental and somatic dysfunction of cervical region: Secondary | ICD-10-CM | POA: Diagnosis not present

## 2022-03-08 DIAGNOSIS — M5136 Other intervertebral disc degeneration, lumbar region: Secondary | ICD-10-CM | POA: Diagnosis not present

## 2022-03-08 DIAGNOSIS — M5032 Other cervical disc degeneration, mid-cervical region, unspecified level: Secondary | ICD-10-CM | POA: Diagnosis not present

## 2022-03-08 DIAGNOSIS — M5137 Other intervertebral disc degeneration, lumbosacral region: Secondary | ICD-10-CM | POA: Diagnosis not present

## 2022-03-08 DIAGNOSIS — M9902 Segmental and somatic dysfunction of thoracic region: Secondary | ICD-10-CM | POA: Diagnosis not present

## 2022-03-08 DIAGNOSIS — M9903 Segmental and somatic dysfunction of lumbar region: Secondary | ICD-10-CM | POA: Diagnosis not present

## 2022-03-08 DIAGNOSIS — M9905 Segmental and somatic dysfunction of pelvic region: Secondary | ICD-10-CM | POA: Diagnosis not present

## 2022-03-08 DIAGNOSIS — S29012A Strain of muscle and tendon of back wall of thorax, initial encounter: Secondary | ICD-10-CM | POA: Diagnosis not present

## 2022-03-08 MED ORDER — MEPOLIZUMAB 100 MG ~~LOC~~ SOLR
100.0000 mg | Freq: Once | SUBCUTANEOUS | Status: AC
  Administered 2022-03-08: 100 mg via SUBCUTANEOUS
  Filled 2022-03-08: qty 1

## 2022-03-08 NOTE — Progress Notes (Cosign Needed)
Diagnosis: Asthma ? ?Provider:  Praveen Mannam, MD ? ?Procedure: Injection ? ?Nucala (Mepolizumab), Dose: 100 mg, Site: subcutaneous, Number of injections: 1 ? ?Discharge: Condition: Good, Destination: Home . AVS provided to patient.  ? ?Performed by:  Ressie Slevin, RN  ? ? ? ?  ?

## 2022-03-14 DIAGNOSIS — L905 Scar conditions and fibrosis of skin: Secondary | ICD-10-CM | POA: Diagnosis not present

## 2022-03-14 DIAGNOSIS — L821 Other seborrheic keratosis: Secondary | ICD-10-CM | POA: Diagnosis not present

## 2022-03-22 DIAGNOSIS — H35371 Puckering of macula, right eye: Secondary | ICD-10-CM | POA: Diagnosis not present

## 2022-03-22 DIAGNOSIS — H43821 Vitreomacular adhesion, right eye: Secondary | ICD-10-CM | POA: Diagnosis not present

## 2022-03-22 DIAGNOSIS — H34812 Central retinal vein occlusion, left eye, with macular edema: Secondary | ICD-10-CM | POA: Diagnosis not present

## 2022-03-22 DIAGNOSIS — H43813 Vitreous degeneration, bilateral: Secondary | ICD-10-CM | POA: Diagnosis not present

## 2022-03-31 ENCOUNTER — Other Ambulatory Visit: Payer: Self-pay | Admitting: Family Medicine

## 2022-03-31 DIAGNOSIS — M25571 Pain in right ankle and joints of right foot: Secondary | ICD-10-CM

## 2022-04-05 ENCOUNTER — Ambulatory Visit (INDEPENDENT_AMBULATORY_CARE_PROVIDER_SITE_OTHER): Payer: Medicare Other

## 2022-04-05 VITALS — BP 138/85 | HR 53 | Temp 98.1°F | Resp 18 | Ht 62.0 in | Wt 211.0 lb

## 2022-04-05 DIAGNOSIS — J4540 Moderate persistent asthma, uncomplicated: Secondary | ICD-10-CM | POA: Diagnosis not present

## 2022-04-05 DIAGNOSIS — M9905 Segmental and somatic dysfunction of pelvic region: Secondary | ICD-10-CM | POA: Diagnosis not present

## 2022-04-05 DIAGNOSIS — M9901 Segmental and somatic dysfunction of cervical region: Secondary | ICD-10-CM | POA: Diagnosis not present

## 2022-04-05 DIAGNOSIS — M5137 Other intervertebral disc degeneration, lumbosacral region: Secondary | ICD-10-CM | POA: Diagnosis not present

## 2022-04-05 DIAGNOSIS — M9902 Segmental and somatic dysfunction of thoracic region: Secondary | ICD-10-CM | POA: Diagnosis not present

## 2022-04-05 DIAGNOSIS — M9903 Segmental and somatic dysfunction of lumbar region: Secondary | ICD-10-CM | POA: Diagnosis not present

## 2022-04-05 DIAGNOSIS — M5136 Other intervertebral disc degeneration, lumbar region: Secondary | ICD-10-CM | POA: Diagnosis not present

## 2022-04-05 DIAGNOSIS — S29012A Strain of muscle and tendon of back wall of thorax, initial encounter: Secondary | ICD-10-CM | POA: Diagnosis not present

## 2022-04-05 DIAGNOSIS — M5032 Other cervical disc degeneration, mid-cervical region, unspecified level: Secondary | ICD-10-CM | POA: Diagnosis not present

## 2022-04-05 MED ORDER — MEPOLIZUMAB 100 MG ~~LOC~~ SOLR
100.0000 mg | Freq: Once | SUBCUTANEOUS | Status: AC
  Administered 2022-04-05: 100 mg via SUBCUTANEOUS
  Filled 2022-04-05: qty 1

## 2022-04-05 NOTE — Progress Notes (Signed)
Diagnosis: Asthma ? ?Provider:  Praveen Mannam, MD ? ?Procedure: Injection ? ?Nucala (Mepolizumab), Dose: 100 mg, Site: subcutaneous, Number of injections: 1 ? ?Discharge: Condition: Good, Destination: Home . AVS provided to patient.  ? ?Performed by:  Shiva Karis, RN  ? ? ? ?  ?

## 2022-05-02 ENCOUNTER — Telehealth: Payer: Self-pay | Admitting: Family Medicine

## 2022-05-02 NOTE — Telephone Encounter (Signed)
Noted, will wait for the report.

## 2022-05-02 NOTE — Telephone Encounter (Signed)
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 Abigail Butts will mail the report she is unable to fax and also pt does have a copy of report

## 2022-05-03 ENCOUNTER — Ambulatory Visit (INDEPENDENT_AMBULATORY_CARE_PROVIDER_SITE_OTHER): Payer: Medicare Other | Admitting: *Deleted

## 2022-05-03 VITALS — BP 151/84 | HR 48 | Temp 97.7°F | Resp 16 | Ht 62.0 in | Wt 212.6 lb

## 2022-05-03 DIAGNOSIS — M9901 Segmental and somatic dysfunction of cervical region: Secondary | ICD-10-CM | POA: Diagnosis not present

## 2022-05-03 DIAGNOSIS — M9903 Segmental and somatic dysfunction of lumbar region: Secondary | ICD-10-CM | POA: Diagnosis not present

## 2022-05-03 DIAGNOSIS — J4540 Moderate persistent asthma, uncomplicated: Secondary | ICD-10-CM

## 2022-05-03 DIAGNOSIS — M5136 Other intervertebral disc degeneration, lumbar region: Secondary | ICD-10-CM | POA: Diagnosis not present

## 2022-05-03 DIAGNOSIS — S29012A Strain of muscle and tendon of back wall of thorax, initial encounter: Secondary | ICD-10-CM | POA: Diagnosis not present

## 2022-05-03 DIAGNOSIS — M9905 Segmental and somatic dysfunction of pelvic region: Secondary | ICD-10-CM | POA: Diagnosis not present

## 2022-05-03 DIAGNOSIS — M5032 Other cervical disc degeneration, mid-cervical region, unspecified level: Secondary | ICD-10-CM | POA: Diagnosis not present

## 2022-05-03 DIAGNOSIS — M9902 Segmental and somatic dysfunction of thoracic region: Secondary | ICD-10-CM | POA: Diagnosis not present

## 2022-05-03 DIAGNOSIS — M5137 Other intervertebral disc degeneration, lumbosacral region: Secondary | ICD-10-CM | POA: Diagnosis not present

## 2022-05-03 MED ORDER — MEPOLIZUMAB 100 MG ~~LOC~~ SOLR
100.0000 mg | Freq: Once | SUBCUTANEOUS | Status: AC
  Administered 2022-05-03: 100 mg via SUBCUTANEOUS
  Filled 2022-05-03: qty 1

## 2022-05-03 NOTE — Progress Notes (Signed)
Diagnosis: Asthma  Provider:  Marshell Garfinkel MD  Procedure: Injection  Nucala (Mepolizumab), Dose: 100 mg, Site: subcutaneous, Number of injections: 1  Discharge: Condition: Good, Destination: Home . AVS provided to patient.   Performed by:  Oren Beckmann, RN

## 2022-05-11 ENCOUNTER — Other Ambulatory Visit: Payer: Self-pay | Admitting: Emergency Medicine

## 2022-05-17 DIAGNOSIS — H43813 Vitreous degeneration, bilateral: Secondary | ICD-10-CM | POA: Diagnosis not present

## 2022-05-17 DIAGNOSIS — H3582 Retinal ischemia: Secondary | ICD-10-CM | POA: Diagnosis not present

## 2022-05-17 DIAGNOSIS — H34812 Central retinal vein occlusion, left eye, with macular edema: Secondary | ICD-10-CM | POA: Diagnosis not present

## 2022-05-17 DIAGNOSIS — H35371 Puckering of macula, right eye: Secondary | ICD-10-CM | POA: Diagnosis not present

## 2022-05-31 ENCOUNTER — Ambulatory Visit (INDEPENDENT_AMBULATORY_CARE_PROVIDER_SITE_OTHER): Payer: Medicare Other | Admitting: *Deleted

## 2022-05-31 VITALS — BP 118/80 | HR 64 | Temp 99.0°F | Resp 20 | Ht 62.0 in | Wt 210.8 lb

## 2022-05-31 DIAGNOSIS — S29012A Strain of muscle and tendon of back wall of thorax, initial encounter: Secondary | ICD-10-CM | POA: Diagnosis not present

## 2022-05-31 DIAGNOSIS — M5032 Other cervical disc degeneration, mid-cervical region, unspecified level: Secondary | ICD-10-CM | POA: Diagnosis not present

## 2022-05-31 DIAGNOSIS — M5136 Other intervertebral disc degeneration, lumbar region: Secondary | ICD-10-CM | POA: Diagnosis not present

## 2022-05-31 DIAGNOSIS — M5137 Other intervertebral disc degeneration, lumbosacral region: Secondary | ICD-10-CM | POA: Diagnosis not present

## 2022-05-31 DIAGNOSIS — J4540 Moderate persistent asthma, uncomplicated: Secondary | ICD-10-CM | POA: Diagnosis not present

## 2022-05-31 DIAGNOSIS — M9905 Segmental and somatic dysfunction of pelvic region: Secondary | ICD-10-CM | POA: Diagnosis not present

## 2022-05-31 DIAGNOSIS — M9902 Segmental and somatic dysfunction of thoracic region: Secondary | ICD-10-CM | POA: Diagnosis not present

## 2022-05-31 DIAGNOSIS — M9901 Segmental and somatic dysfunction of cervical region: Secondary | ICD-10-CM | POA: Diagnosis not present

## 2022-05-31 DIAGNOSIS — M9903 Segmental and somatic dysfunction of lumbar region: Secondary | ICD-10-CM | POA: Diagnosis not present

## 2022-05-31 MED ORDER — MEPOLIZUMAB 100 MG ~~LOC~~ SOLR
100.0000 mg | Freq: Once | SUBCUTANEOUS | Status: AC
  Administered 2022-05-31: 100 mg via SUBCUTANEOUS
  Filled 2022-05-31: qty 1

## 2022-05-31 NOTE — Progress Notes (Signed)
Diagnosis: Asthma  Provider:  Marshell Garfinkel MD  Procedure: Injection  Nucala (Mepolizumab), Dose: 100 mg, Site: subcutaneous, Number of injections: 1  Discharge: Condition: Good, Destination: Home . AVS provided to patient.   Performed by:  Oren Beckmann, RN

## 2022-06-05 DIAGNOSIS — M5137 Other intervertebral disc degeneration, lumbosacral region: Secondary | ICD-10-CM | POA: Diagnosis not present

## 2022-06-05 DIAGNOSIS — M5136 Other intervertebral disc degeneration, lumbar region: Secondary | ICD-10-CM | POA: Diagnosis not present

## 2022-06-05 DIAGNOSIS — M9901 Segmental and somatic dysfunction of cervical region: Secondary | ICD-10-CM | POA: Diagnosis not present

## 2022-06-05 DIAGNOSIS — M9902 Segmental and somatic dysfunction of thoracic region: Secondary | ICD-10-CM | POA: Diagnosis not present

## 2022-06-05 DIAGNOSIS — M9905 Segmental and somatic dysfunction of pelvic region: Secondary | ICD-10-CM | POA: Diagnosis not present

## 2022-06-05 DIAGNOSIS — M5032 Other cervical disc degeneration, mid-cervical region, unspecified level: Secondary | ICD-10-CM | POA: Diagnosis not present

## 2022-06-05 DIAGNOSIS — M9903 Segmental and somatic dysfunction of lumbar region: Secondary | ICD-10-CM | POA: Diagnosis not present

## 2022-06-05 DIAGNOSIS — S29012A Strain of muscle and tendon of back wall of thorax, initial encounter: Secondary | ICD-10-CM | POA: Diagnosis not present

## 2022-06-19 DIAGNOSIS — H401111 Primary open-angle glaucoma, right eye, mild stage: Secondary | ICD-10-CM | POA: Diagnosis not present

## 2022-06-19 DIAGNOSIS — H401122 Primary open-angle glaucoma, left eye, moderate stage: Secondary | ICD-10-CM | POA: Diagnosis not present

## 2022-06-20 DIAGNOSIS — M5137 Other intervertebral disc degeneration, lumbosacral region: Secondary | ICD-10-CM | POA: Diagnosis not present

## 2022-06-20 DIAGNOSIS — M9903 Segmental and somatic dysfunction of lumbar region: Secondary | ICD-10-CM | POA: Diagnosis not present

## 2022-06-20 DIAGNOSIS — M9905 Segmental and somatic dysfunction of pelvic region: Secondary | ICD-10-CM | POA: Diagnosis not present

## 2022-06-20 DIAGNOSIS — M5136 Other intervertebral disc degeneration, lumbar region: Secondary | ICD-10-CM | POA: Diagnosis not present

## 2022-06-20 DIAGNOSIS — S29012A Strain of muscle and tendon of back wall of thorax, initial encounter: Secondary | ICD-10-CM | POA: Diagnosis not present

## 2022-06-20 DIAGNOSIS — M5032 Other cervical disc degeneration, mid-cervical region, unspecified level: Secondary | ICD-10-CM | POA: Diagnosis not present

## 2022-06-20 DIAGNOSIS — M9901 Segmental and somatic dysfunction of cervical region: Secondary | ICD-10-CM | POA: Diagnosis not present

## 2022-06-20 DIAGNOSIS — M9902 Segmental and somatic dysfunction of thoracic region: Secondary | ICD-10-CM | POA: Diagnosis not present

## 2022-06-24 ENCOUNTER — Other Ambulatory Visit: Payer: Self-pay | Admitting: Family Medicine

## 2022-06-24 DIAGNOSIS — M25571 Pain in right ankle and joints of right foot: Secondary | ICD-10-CM

## 2022-06-27 ENCOUNTER — Other Ambulatory Visit: Payer: Self-pay | Admitting: Family Medicine

## 2022-06-27 DIAGNOSIS — G8929 Other chronic pain: Secondary | ICD-10-CM

## 2022-06-28 ENCOUNTER — Ambulatory Visit (INDEPENDENT_AMBULATORY_CARE_PROVIDER_SITE_OTHER): Payer: Medicare Other

## 2022-06-28 VITALS — BP 153/80 | HR 59 | Temp 98.4°F | Resp 20 | Ht 62.0 in | Wt 210.4 lb

## 2022-06-28 DIAGNOSIS — J4540 Moderate persistent asthma, uncomplicated: Secondary | ICD-10-CM

## 2022-06-28 MED ORDER — MEPOLIZUMAB 100 MG ~~LOC~~ SOLR
100.0000 mg | Freq: Once | SUBCUTANEOUS | Status: AC
  Administered 2022-06-28: 100 mg via SUBCUTANEOUS
  Filled 2022-06-28: qty 1

## 2022-06-28 NOTE — Progress Notes (Signed)
Diagnosis: Asthma  Provider:  Praveen Mannam MD  Procedure: Injection  Nucala (Mepolizumab), Dose: 100 mg, Site: subcutaneous, Number of injections: 1  Discharge: Condition: Good, Destination: Home . AVS provided to patient.   Performed by:  Daud Cayer, RN       

## 2022-07-24 ENCOUNTER — Other Ambulatory Visit: Payer: Self-pay | Admitting: Family Medicine

## 2022-07-24 DIAGNOSIS — M25571 Pain in right ankle and joints of right foot: Secondary | ICD-10-CM

## 2022-07-26 ENCOUNTER — Ambulatory Visit (INDEPENDENT_AMBULATORY_CARE_PROVIDER_SITE_OTHER): Payer: Medicare Other | Admitting: *Deleted

## 2022-07-26 ENCOUNTER — Other Ambulatory Visit: Payer: Self-pay | Admitting: Family Medicine

## 2022-07-26 VITALS — BP 124/80 | HR 53 | Temp 98.0°F | Resp 16 | Ht 62.0 in | Wt 210.6 lb

## 2022-07-26 DIAGNOSIS — J4540 Moderate persistent asthma, uncomplicated: Secondary | ICD-10-CM | POA: Diagnosis not present

## 2022-07-26 DIAGNOSIS — G8929 Other chronic pain: Secondary | ICD-10-CM

## 2022-07-26 MED ORDER — MEPOLIZUMAB 100 MG ~~LOC~~ SOLR
100.0000 mg | Freq: Once | SUBCUTANEOUS | Status: AC
  Administered 2022-07-26: 100 mg via SUBCUTANEOUS
  Filled 2022-07-26: qty 1

## 2022-07-26 NOTE — Progress Notes (Signed)
Diagnosis: Asthma  Provider:  Praveen Mannam MD  Procedure: Injection  Nucala (Mepolizumab), Dose: 100 mg, Site: subcutaneous, Number of injections: 1  Post Care: Observation period completed  Discharge: Condition: Good, Destination: Home . AVS provided to patient.   Performed by:  Domanick Cuccia A, RN       

## 2022-07-30 DIAGNOSIS — H43813 Vitreous degeneration, bilateral: Secondary | ICD-10-CM | POA: Diagnosis not present

## 2022-07-30 DIAGNOSIS — H35371 Puckering of macula, right eye: Secondary | ICD-10-CM | POA: Diagnosis not present

## 2022-07-30 DIAGNOSIS — H34812 Central retinal vein occlusion, left eye, with macular edema: Secondary | ICD-10-CM | POA: Diagnosis not present

## 2022-07-30 DIAGNOSIS — H3562 Retinal hemorrhage, left eye: Secondary | ICD-10-CM | POA: Diagnosis not present

## 2022-07-30 DIAGNOSIS — H43822 Vitreomacular adhesion, left eye: Secondary | ICD-10-CM | POA: Diagnosis not present

## 2022-07-30 DIAGNOSIS — H3582 Retinal ischemia: Secondary | ICD-10-CM | POA: Diagnosis not present

## 2022-08-04 ENCOUNTER — Other Ambulatory Visit: Payer: Self-pay | Admitting: Family Medicine

## 2022-08-04 DIAGNOSIS — G8929 Other chronic pain: Secondary | ICD-10-CM

## 2022-08-08 DIAGNOSIS — M9905 Segmental and somatic dysfunction of pelvic region: Secondary | ICD-10-CM | POA: Diagnosis not present

## 2022-08-08 DIAGNOSIS — M5032 Other cervical disc degeneration, mid-cervical region, unspecified level: Secondary | ICD-10-CM | POA: Diagnosis not present

## 2022-08-08 DIAGNOSIS — S29012A Strain of muscle and tendon of back wall of thorax, initial encounter: Secondary | ICD-10-CM | POA: Diagnosis not present

## 2022-08-08 DIAGNOSIS — M9902 Segmental and somatic dysfunction of thoracic region: Secondary | ICD-10-CM | POA: Diagnosis not present

## 2022-08-08 DIAGNOSIS — M9903 Segmental and somatic dysfunction of lumbar region: Secondary | ICD-10-CM | POA: Diagnosis not present

## 2022-08-08 DIAGNOSIS — M5137 Other intervertebral disc degeneration, lumbosacral region: Secondary | ICD-10-CM | POA: Diagnosis not present

## 2022-08-08 DIAGNOSIS — M5136 Other intervertebral disc degeneration, lumbar region: Secondary | ICD-10-CM | POA: Diagnosis not present

## 2022-08-08 DIAGNOSIS — M9901 Segmental and somatic dysfunction of cervical region: Secondary | ICD-10-CM | POA: Diagnosis not present

## 2022-08-13 NOTE — Progress Notes (Unsigned)
ACUTE VISIT Chief Complaint  Patient presents with   Sore Throat   HPI: Ms.Felix Markie Heffernan is a 71 y.o. female with medical history significant for asthma, hypertension, hyperlipidemia, vitamin D deficiency, and depression here today with her daughter complaining of  sore throat, which began on Saturday evening. Associated non productive cough and mild frontal pressure headache.  Sore Throat  This is a new problem. The current episode started in the past 7 days. The problem has been unchanged. There has been no fever. The pain is severe. Associated symptoms include congestion, coughing, ear pain, headaches and trouble swallowing (due to pain). Pertinent negatives include no abdominal pain, diarrhea, drooling, ear discharge, hoarse voice, plugged ear sensation, neck pain, shortness of breath, stridor, swollen glands or vomiting. She has had no exposure to strep or mono. She has tried acetaminophen for the symptoms. The treatment provided mild relief.  She mentions feeling slightly nauseous but denies any abdominal pain, vomiting, or diarrhea.   She reports being around other people during Thanksgiving but is unaware of anyone being sick with similar symptoms. he has been taking Tylenol for her throat pain.  Asthma: She has not needed to use her Albuterol inh. She is on Singulair 10 mg daily and Dulera 200-5 mcg 2 puff bid.  Requesting refills on Gabapentin 300 mg, which she takes at bedtime as needed. She takes medication for chronic lower back pain , radiated to LE's. Negative for saddle anesthesia, bladder/bowel dysfunction. Gabapentin is still helping.  Review of Systems  Constitutional:  Positive for activity change, appetite change and fatigue.  HENT:  Positive for congestion, ear pain, postnasal drip and trouble swallowing (due to pain). Negative for drooling, ear discharge and hoarse voice.   Respiratory:  Positive for cough. Negative for shortness of breath and stridor.    Gastrointestinal:  Negative for abdominal pain, diarrhea and vomiting.  Genitourinary:  Negative for decreased urine volume, dysuria and hematuria.  Musculoskeletal:  Positive for back pain. Negative for neck pain.  Skin:  Negative for rash.  Neurological:  Positive for headaches. Negative for syncope and weakness.  See other pertinent positives and negatives in HPI.  Current Outpatient Medications on File Prior to Visit  Medication Sig Dispense Refill   Aflibercept (EYLEA) 2 MG/0.05ML SOSY by Intravitreal route. Given by eye doctor.     amLODipine (NORVASC) 5 MG tablet Take 1 tablet (5 mg total) by mouth daily. 90 tablet 3   bimatoprost (LUMIGAN) 0.01 % SOLN Place 1 drop into both eyes at bedtime.     dorzolamide (TRUSOPT) 2 % ophthalmic solution Place 1 drop into both eyes 3 (three) times daily.     DULERA 200-5 MCG/ACT AERO USE 2 INHALATIONS TWICE A DAY (MUST HAVE OFFICE VISIT FOR FURTHER REFILLS) 39 g 3   FLUoxetine (PROZAC) 40 MG capsule TAKE 1 CAPSULE BY MOUTH EVERY DAY 90 capsule 2   LORazepam (ATIVAN) 0.5 MG tablet Take 0.5 mg by mouth daily as needed.     meloxicam (MOBIC) 15 MG tablet TAKE ONE TABLET BY MOUTH DAILY AS NEEDED **NEED INSURANCE** 30 tablet 0   montelukast (SINGULAIR) 10 MG tablet TAKE 1 TABLET BY MOUTH EVERY DAY AT BEDTIME 90 tablet 1   nystatin (MYCOSTATIN) 100000 UNIT/ML suspension Take 5 mLs (500,000 Units total) by mouth 3 (three) times daily. 473 mL 1   timolol (TIMOPTIC-XR) 0.25 % ophthalmic gel-forming Place 1 drop into the left eye daily.     No current facility-administered medications on file prior to  visit.   Past Medical History:  Diagnosis Date   Arthritis    Asthma    Basal cell carcinoma    Nose, forehead & left shoulder   Depression    Glaucoma    History of colon polyps    Hypertension    Allergies  Allergen Reactions   Simbrinza [Brinzolamide-Brimonidine]    Social History   Socioeconomic History   Marital status: Widowed    Spouse  name: Not on file   Number of children: 3   Years of education: Not on file   Highest education level: Not on file  Occupational History   Occupation: Retired  Tobacco Use   Smoking status: Never   Smokeless tobacco: Never  Vaping Use   Vaping Use: Never used  Substance and Sexual Activity   Alcohol use: Yes    Comment: socially   Drug use: Never   Sexual activity: Not Currently  Other Topics Concern   Not on file  Social History Narrative   Not on file   Social Determinants of Health   Financial Resource Strain: Low Risk  (11/08/2021)   Overall Financial Resource Strain (CARDIA)    Difficulty of Paying Living Expenses: Not hard at all  Food Insecurity: No Food Insecurity (11/08/2021)   Hunger Vital Sign    Worried About Running Out of Food in the Last Year: Never true    [place] in the Last Year: Never true  Transportation Needs: No Transportation Needs (11/08/2021)   PRAPARE - Hydrologist (Medical): No    Lack of Transportation (Non-Medical): No  Physical Activity: Insufficiently Active (11/08/2021)   Exercise Vital Sign    Days of Exercise per Week: 2 days    Minutes of Exercise per Session: 10 min  Stress: No Stress Concern Present (11/08/2021)   [place]    Feeling of Stress : Not at all  Social Connections: Socially Isolated (11/08/2021)   Social Connection and Isolation Panel [NHANES]    Frequency of Communication with Friends and Family: More than three times a week    Frequency of Social Gatherings with Friends and Family: More than three times a week    Attends Religious Services: Never    Marine scientist or Organizations: No    Attends Archivist Meetings: Never    Marital Status: Widowed   Vitals:   08/14/22 1409  BP: 128/80  Pulse: 75  Resp: 16  Temp: 99.7 F (37.6 C)  SpO2: 97%   Body mass index is 38.32 kg/m.  Physical  Exam Vitals and nursing note reviewed.  Constitutional:      General: She is not in acute distress.    Appearance: She is well-developed.  HENT:     Head: Normocephalic and atraumatic.     Right Ear: Tympanic membrane, ear canal and external ear normal.     Left Ear: Tympanic membrane, ear canal and external ear normal.     Nose: Septal deviation, congestion and rhinorrhea present.     Mouth/Throat:     Mouth: Mucous membranes are moist.     Pharynx: Uvula midline. Posterior oropharyngeal erythema present. No oropharyngeal exudate or uvula swelling.  Eyes:     Conjunctiva/sclera: Conjunctivae normal.  Cardiovascular:     Rate and Rhythm: Normal rate and regular rhythm.     Heart sounds: No murmur heard. Pulmonary:     Effort: Pulmonary effort  is normal. No respiratory distress.     Breath sounds: Normal breath sounds.  Lymphadenopathy:     Head:     Right side of head: No submandibular adenopathy.     Left side of head: No submandibular adenopathy.     Cervical: Cervical adenopathy (=< 1 cm, not tender, bilateral.) present.     Right cervical: Superficial cervical adenopathy present.     Left cervical: Superficial cervical adenopathy present.  Skin:    General: Skin is warm.     Findings: No erythema.  Neurological:     General: No focal deficit present.     Mental Status: She is alert and oriented to person, place, and time.   ASSESSMENT AND PLAN: Ms. Romberg is a 71 year old female seen today for sore throat.  URI, acute Assessment & Plan: Symptoms suggests a viral etiology. Today COVID 19 , rapid flu,and rapid strep test all negative. Symptomatic treatment recommended. Instructed to monitor for signs of complications, including new onset of fever among some, clearly instructed about warning signs. Cough and nasal congestion can last a few days and sometimes weeks after acute symptoms have resolved. Throat lozenges for sore throat. F/U as needed.   Chronic bilateral  low back pain with sciatica, sciatica laterality unspecified Assessment & Plan: Problem is stable. Continue Gabapentin 300 mg at bedtime as needed.  Orders: -     Gabapentin; TAKE 1 CAPSULE BY MOUTH EVERY DAY AS NEEDED  Dispense: 30 capsule; Refill: 2  Moderate persistent asthma without complication Assessment & Plan: Problem is well controlled. Instructed to use Albuterol inh 2 puff every 6 hours for a week if cough gets worse or she has any wheezing. Continue Dulera and Singulair same dose. Follows with pulmonologist.    Return if symptoms worsen or fail to improve, for keep next appointment.  Sriram Febles G. [place], MD  [facility]. [place] office.

## 2022-08-14 ENCOUNTER — Encounter: Payer: Self-pay | Admitting: Family Medicine

## 2022-08-14 ENCOUNTER — Ambulatory Visit (INDEPENDENT_AMBULATORY_CARE_PROVIDER_SITE_OTHER): Payer: Medicare Other | Admitting: Family Medicine

## 2022-08-14 VITALS — BP 128/80 | HR 75 | Temp 99.7°F | Resp 16 | Ht 62.0 in | Wt 209.5 lb

## 2022-08-14 DIAGNOSIS — M5440 Lumbago with sciatica, unspecified side: Secondary | ICD-10-CM | POA: Diagnosis not present

## 2022-08-14 DIAGNOSIS — J4540 Moderate persistent asthma, uncomplicated: Secondary | ICD-10-CM

## 2022-08-14 DIAGNOSIS — J069 Acute upper respiratory infection, unspecified: Secondary | ICD-10-CM

## 2022-08-14 DIAGNOSIS — G8929 Other chronic pain: Secondary | ICD-10-CM | POA: Diagnosis not present

## 2022-08-14 MED ORDER — GABAPENTIN 300 MG PO CAPS: ORAL_CAPSULE | ORAL | 2 refills | Status: DC

## 2022-08-14 NOTE — Assessment & Plan Note (Signed)
Problem is stable. Continue Gabapentin 300 mg at bedtime as needed.

## 2022-08-14 NOTE — Patient Instructions (Signed)
A few things to remember from today's visit:  URI, acute viral infections are self-limited and we treat each symptom depending of severity.  Over the counter medications as decongestants and cold medications usually help, they need to be taken with caution if there is a history of high blood pressure or palpitations. Tylenol and/or Ibuprofen also helps with most symptoms (headache, muscle aching, fever,etc) Plenty of fluids. Honey helps with cough. Steam inhalations helps with runny nose, nasal congestion, and may prevent sinus infections. Cough and nasal congestion could last a few days and sometimes weeks. Please follow in not any better in 1-2 weeks or if symptoms get worse.  If you need refills for medications you take chronically, please call your pharmacy. Do not use My Chart to request refills or for acute issues that need immediate attention. If you send a my chart message, it may take a few days to be addressed, specially if I am not in the office.  Please be sure medication list is accurate. If a new problem present, please set up appointment sooner than planned today.

## 2022-08-14 NOTE — Assessment & Plan Note (Addendum)
Problem is well controlled. Instructed to use Albuterol inh 2 puff every 6 hours for a week if cough gets worse or she has any wheezing. Continue Dulera and Singulair same dose. Follows with pulmonologist.

## 2022-08-14 NOTE — Assessment & Plan Note (Signed)
Symptoms suggests a viral etiology. Today COVID 19 , rapid flu,and rapid strep test all negative. Symptomatic treatment recommended. Instructed to monitor for signs of complications, including new onset of fever among some, clearly instructed about warning signs. Cough and nasal congestion can last a few days and sometimes weeks after acute symptoms have resolved. Throat lozenges for sore throat. F/U as needed.

## 2022-08-19 ENCOUNTER — Emergency Department (HOSPITAL_BASED_OUTPATIENT_CLINIC_OR_DEPARTMENT_OTHER): Payer: Medicare Other | Admitting: Radiology

## 2022-08-19 ENCOUNTER — Emergency Department (HOSPITAL_BASED_OUTPATIENT_CLINIC_OR_DEPARTMENT_OTHER)
Admission: EM | Admit: 2022-08-19 | Discharge: 2022-08-19 | Disposition: A | Discharge status: Home / Self Care | Payer: Medicare Other | Attending: Emergency Medicine | Admitting: Emergency Medicine

## 2022-08-19 ENCOUNTER — Encounter (HOSPITAL_BASED_OUTPATIENT_CLINIC_OR_DEPARTMENT_OTHER): Payer: Self-pay | Admitting: Emergency Medicine

## 2022-08-19 DIAGNOSIS — I10 Essential (primary) hypertension: Secondary | ICD-10-CM | POA: Insufficient documentation

## 2022-08-19 DIAGNOSIS — J029 Acute pharyngitis, unspecified: Secondary | ICD-10-CM | POA: Insufficient documentation

## 2022-08-19 DIAGNOSIS — H9393 Unspecified disorder of ear, bilateral: Secondary | ICD-10-CM | POA: Diagnosis not present

## 2022-08-19 DIAGNOSIS — Z79899 Other long term (current) drug therapy: Secondary | ICD-10-CM | POA: Insufficient documentation

## 2022-08-19 DIAGNOSIS — J4541 Moderate persistent asthma with (acute) exacerbation: Secondary | ICD-10-CM | POA: Diagnosis not present

## 2022-08-19 DIAGNOSIS — J210 Acute bronchiolitis due to respiratory syncytial virus: Secondary | ICD-10-CM | POA: Diagnosis not present

## 2022-08-19 DIAGNOSIS — R059 Cough, unspecified: Secondary | ICD-10-CM | POA: Diagnosis not present

## 2022-08-19 LAB — CBC
HCT: 41.6 % (ref 36.0–46.0)
Hemoglobin: 13.9 g/dL (ref 12.0–15.0)
MCH: 31.4 pg (ref 26.0–34.0)
MCHC: 33.4 g/dL (ref 30.0–36.0)
MCV: 93.9 fL (ref 80.0–100.0)
Platelets: 343 10*3/uL (ref 150–400)
RBC: 4.43 MIL/uL (ref 3.87–5.11)
RDW: 12.3 % (ref 11.5–15.5)
WBC: 9.6 10*3/uL (ref 4.0–10.5)
nRBC: 0 % (ref 0.0–0.2)

## 2022-08-19 LAB — COMPREHENSIVE METABOLIC PANEL
ALT: 89 U/L — ABNORMAL HIGH (ref 0–44)
AST: 55 U/L — ABNORMAL HIGH (ref 15–41)
Albumin: 4 g/dL (ref 3.5–5.0)
Alkaline Phosphatase: 165 U/L — ABNORMAL HIGH (ref 38–126)
Anion gap: 9 (ref 5–15)
BUN: 14 mg/dL (ref 8–23)
CO2: 25 mmol/L (ref 22–32)
Calcium: 9.4 mg/dL (ref 8.9–10.3)
Chloride: 102 mmol/L (ref 98–111)
Creatinine, Ser: 0.66 mg/dL (ref 0.44–1.00)
GFR, Estimated: >60 mL/min (ref >60)
Glucose, Bld: 103 mg/dL — ABNORMAL HIGH (ref 70–99)
Potassium: 3.7 mmol/L (ref 3.5–5.1)
Sodium: 136 mmol/L (ref 135–145)
Total Bilirubin: 0.4 mg/dL (ref 0.3–1.2)
Total Protein: 7.5 g/dL (ref 6.5–8.1)

## 2022-08-19 MED ORDER — PREDNISONE 20 MG PO TABS: 40.0000 mg | ORAL_TABLET | Freq: Every day | ORAL | 0 refills | Status: DC

## 2022-08-19 MED ORDER — AEROCHAMBER PLUS FLO-VU MEDIUM MISC
1.0000 | Freq: Once | Status: AC
  Administered 2022-08-19: 1
  Filled 2022-08-19: qty 1

## 2022-08-19 MED ORDER — ALBUTEROL SULFATE (2.5 MG/3ML) 0.083% IN NEBU: 2.5000 mg | INHALATION_SOLUTION | Freq: Four times a day (QID) | RESPIRATORY_TRACT | 1 refills | Status: AC | PRN

## 2022-08-19 MED ORDER — ALBUTEROL SULFATE HFA 108 (90 BASE) MCG/ACT IN AERS
2.0000 | INHALATION_SPRAY | RESPIRATORY_TRACT | Status: DC | PRN
  Administered 2022-08-19: 2 via RESPIRATORY_TRACT
  Filled 2022-08-19: qty 6.7

## 2022-08-19 MED ORDER — PREDNISONE 50 MG PO TABS
60.0000 mg | ORAL_TABLET | Freq: Once | ORAL | Status: AC
  Administered 2022-08-19: 60 mg via ORAL
  Filled 2022-08-19: qty 1

## 2022-08-19 NOTE — ED Provider Notes (Signed)
[facility] EMERGENCY DEPT Provider Note   CSN: [id number] Arrival date & time: 08/19/22  1637     History  Chief Complaint  Patient presents with   Cough    Ana Hughes is a 72 y.o. female.  Patient is a 71 year old female with a history of asthma on injection therapy and daily Dulera, hypertension who is presenting today with persistent cough, ear pain, sore throat and wheezing.  Patient reports on Tuesday she saw her doctor after she had been having symptoms for multiple days.  She tested negative for strep, COVID and flu.  However since that time she has continued to have the above symptoms.  She does not feel short of breath she is using her albuterol nebulizer as needed but reports overall she is just not getting better.  Her biggest complaint is pain in her face and ears as well as a lot of congestion.  She does report over Thanksgiving she was exposed to a relative whose child had RSV.  She has had occasional diarrhea but not persistent and denies any vomiting but has a decreased appetite.  The history is provided by the patient and a relative.  Cough      Home Medications Prior to Admission medications   Medication Sig Start Date End Date Taking? Authorizing Provider  albuterol (PROVENTIL) (2.5 MG/3ML) 0.083% nebulizer solution Take 3 mLs (2.5 mg total) by nebulization every 6 (six) hours as needed for wheezing or shortness of breath. 08/19/22  Yes Deane Melick, Loree Fee, MD  predniSONE (DELTASONE) 20 MG tablet Take 2 tablets (40 mg total) by mouth daily. 08/19/22  Yes Blanchie Dessert, MD  Aflibercept (EYLEA) 2 MG/0.05ML SOSY by Intravitreal route. Given by eye doctor.    [provider]  amLODipine (NORVASC) 5 MG tablet Take 1 tablet (5 mg total) by mouth daily. 10/31/21   [place], Betty G, MD  bimatoprost (LUMIGAN) 0.01 % SOLN Place 1 drop into both eyes at bedtime.    [provider]  dorzolamide (TRUSOPT) 2 % ophthalmic solution Place 1  drop into both eyes 3 (three) times daily.    [provider]  DULERA 200-5 MCG/ACT AERO USE 2 INHALATIONS TWICE A DAY (MUST HAVE OFFICE VISIT FOR FURTHER REFILLS) 10/17/21   Collene Gobble, MD  FLUoxetine (PROZAC) 40 MG capsule TAKE 1 CAPSULE BY MOUTH EVERY DAY 02/19/22   [place], Betty G, MD  gabapentin (NEURONTIN) 300 MG capsule TAKE 1 CAPSULE BY MOUTH EVERY DAY AS NEEDED 08/14/22   [place], Betty G, MD  LORazepam (ATIVAN) 0.5 MG tablet Take 0.5 mg by mouth daily as needed. 11/27/21   [provider]  meloxicam (MOBIC) 15 MG tablet TAKE ONE TABLET BY MOUTH DAILY AS NEEDED **NEED INSURANCE** 06/25/22   [place], Betty G, MD  montelukast (SINGULAIR) 10 MG tablet TAKE 1 TABLET BY MOUTH EVERY DAY AT BEDTIME 05/11/22   Collene Gobble, MD  nystatin (MYCOSTATIN) 100000 UNIT/ML suspension Take 5 mLs (500,000 Units total) by mouth 3 (three) times daily. 06/20/21   [place], Betty G, MD  timolol (TIMOPTIC-XR) 0.25 % ophthalmic gel-forming Place 1 drop into the left eye daily.    [provider]      Allergies    Simbrinza [brinzolamide-brimonidine]    Review of Systems   Review of Systems  Respiratory:  Positive for cough.     Physical Exam Updated Vital Signs BP 128/78   Pulse 67   Temp 98.8 F (37.1 C)   Resp 20  SpO2 99%  Physical Exam Vitals and nursing note reviewed.  Constitutional:      General: She is not in acute distress.    Appearance: She is well-developed.  HENT:     Head: Normocephalic and atraumatic.     Right Ear: A middle ear effusion is present.     Left Ear: A middle ear effusion is present.     Nose: Mucosal edema and rhinorrhea present.  Eyes:     Pupils: Pupils are equal, round, and reactive to light.  Cardiovascular:     Rate and Rhythm: Normal rate and regular rhythm.     Heart sounds: Normal heart sounds. No murmur heard.    No friction rub.  Pulmonary:     Effort: Pulmonary effort is normal.     Breath sounds: Wheezing present. No  rales.     Comments: Coarse rattly cough and scant expiratory wheezes after DuoNeb. Abdominal:     General: Bowel sounds are normal. There is no distension.     Palpations: Abdomen is soft.     Tenderness: There is no abdominal tenderness. There is no guarding or rebound.  Musculoskeletal:        General: No tenderness. Normal range of motion.     Right lower leg: No edema.     Left lower leg: No edema.     Comments: No edema  Skin:    General: Skin is warm and dry.     Findings: No rash.  Neurological:     Mental Status: She is alert and oriented to person, place, and time.     Cranial Nerves: No cranial nerve deficit.  Psychiatric:        Mood and Affect: Mood normal.        Behavior: Behavior normal.     ED Results / Procedures / Treatments   Labs (all labs ordered are listed, but only abnormal results are displayed) Labs Reviewed  COMPREHENSIVE METABOLIC PANEL - Abnormal; Notable for the following components:      Result Value   Glucose, Bld 103 (*)    AST 55 (*)    ALT 89 (*)    Alkaline Phosphatase 165 (*)    All other components within normal limits  CBC    EKG None  Radiology DG Chest 2 View  Result Date: 08/19/2022 CLINICAL DATA:  Cough and congestion. EXAM: CHEST - 2 VIEW COMPARISON:  08/19/2022 and prior studies FINDINGS: The cardiomediastinal silhouette is unremarkable. Elevation of the RIGHT hemidiaphragm noted. There is no evidence of focal airspace disease, pulmonary edema, suspicious pulmonary nodule/mass, pleural effusion, or pneumothorax. No acute bony abnormalities are identified. IMPRESSION: No active cardiopulmonary disease. Electronically Signed   By: Margarette Canada M.D.   On: 08/19/2022 18:00    Procedures Procedures    Medications Ordered in ED Medications  albuterol (VENTOLIN HFA) 108 (90 Base) MCG/ACT inhaler 2 puff (has no administration in time range)  AeroChamber Plus Flo-Vu Medium MISC 1 each (has no administration in time range)   predniSONE (DELTASONE) tablet 60 mg (60 mg Oral Given 08/19/22 2154)    ED Course/ Medical Decision Making/ A&P                           Medical Decision Making Amount and/or Complexity of Data Reviewed Labs: ordered. Decision-making details documented in ED Course. Radiology: ordered and independent interpretation performed. Decision-making details documented in ED Course.  Risk Prescription drug management.  Pt with multiple medical problems and comorbidities and presenting today with a complaint that caries a high risk for morbidity and mortality.  Here today with cough and congestion and viral symptoms.  Patient most likely has RSV as she was exposed to a child during Thanksgiving who did test positive.  She has extensive asthma and is on suppressive therapy with injection as well as Dulera daily.  She denies any shortness of breath and is satting 99% on room air but does have wheezing.  This did improve after albuterol and Atrovent.  I independently interpreted patient's labs today and CBC and BMP are within normal limits. I have independently visualized and interpreted pt's images today.  Chest x-ray today without evidence of pneumonia.  Findings were discussed with patient and her daughter.  She does have bilateral ear effusions but no evidence of otitis media.  No indication for antibiotics at this time and will give a course of steroids.  Patient is comfortable with this plan.  She was given return precautions.  No findings today to suggest ACS, CHF and low suspicion for PE.          Final Clinical Impression(s) / ED Diagnoses Final diagnoses:  Moderate persistent asthma with exacerbation  RSV (acute bronchiolitis due to respiratory syncytial virus)    Rx / DC Orders ED Discharge Orders          Ordered    predniSONE (DELTASONE) 20 MG tablet  Daily        08/19/22 2155    albuterol (PROVENTIL) (2.5 MG/3ML) 0.083% nebulizer solution  Every 6 hours PRN        08/19/22  2155              Blanchie Dessert, MD 08/19/22 2201

## 2022-08-19 NOTE — ED Notes (Signed)
RT educated pt on proper use of MDI w/spacer. Pt able to perform without difficulty. Pt verbalizes understanding.  ?

## 2022-08-19 NOTE — ED Triage Notes (Signed)
Pt here from home with c/o cough and congestion since last Sunday , negative covid flu and strept at the md office on Tuesday, no fevers

## 2022-08-19 NOTE — Discharge Instructions (Addendum)
X-ray today the without signs of pneumonia.  Blood work looks good.  You were given a prescription for prednisone and more albuterol nebulizers.  Use the albuterol as needed every 4-6 hours.  He do not need a dose of prednisone till tomorrow.  If you suddenly start developing fever, new sputum production, inability to catch her breath return to the emergency room.

## 2022-08-23 ENCOUNTER — Ambulatory Visit: Payer: Medicare Other

## 2022-08-23 ENCOUNTER — Telehealth: Payer: Self-pay | Admitting: Family Medicine

## 2022-08-23 NOTE — Telephone Encounter (Signed)
Pt went to hospital and Dr think she might have RSV was given an antibiotic but pt believes it is not working. Pt had scheduled an appt on Mon. 12/11 at 12pm and wanted to see if nurse can call her back to see if she can do anything over the weekend.   Please advise.

## 2022-08-24 ENCOUNTER — Ambulatory Visit (INDEPENDENT_AMBULATORY_CARE_PROVIDER_SITE_OTHER): Payer: Medicare Other

## 2022-08-24 VITALS — BP 128/64 | HR 78 | Temp 97.7°F | Resp 22 | Ht 62.0 in | Wt 207.8 lb

## 2022-08-24 DIAGNOSIS — J4540 Moderate persistent asthma, uncomplicated: Secondary | ICD-10-CM | POA: Diagnosis not present

## 2022-08-24 MED ORDER — MEPOLIZUMAB 100 MG ~~LOC~~ SOLR
100.0000 mg | Freq: Once | SUBCUTANEOUS | Status: AC
  Administered 2022-08-24: 100 mg via SUBCUTANEOUS
  Filled 2022-08-24: qty 1

## 2022-08-24 NOTE — Telephone Encounter (Signed)
Monitor for new symptoms and seek immediate medical attention if symptoms get worse currently. Thanks, BJ

## 2022-08-24 NOTE — Telephone Encounter (Signed)
I called and spoke with patient. We went over the information below & she verbalized understanding.  

## 2022-08-24 NOTE — Progress Notes (Signed)
Diagnosis: Asthma  Provider:  Praveen Mannam MD  Procedure: Injection  Nucala (Mepolizumab), Dose: 100 mg, Site: subcutaneous, Number of injections: 1  Discharge: Condition: Good, Destination: Home . AVS provided to patient.   Performed by:  Mona Ayars, RN       

## 2022-08-24 NOTE — Progress Notes (Unsigned)
HPI: [name] is a 71 y.o. female with medical history significant for asthma, hypertension, hyperlipidemia, vitamin D deficiency, and depression here today with her daughter to follow on recent ED visit. Evaluated in the ED on 08/19/2022, diagnosed with asthma exacerbation and RSV infection. She was seen here in the office with respiratory symptoms on 08/14/22. States that she was indirectly exposed to someone with RSV over Thanksgiving.  She reports improvement in general, with no SOB, wheezing, chest pain, or fever. However, she mentions that her right ear started with fullness sensation, feeling like she has fluid 3-4 days ago. No ear drainage or earache and she still has post nasal drainage and rhinorrhea.  She completed a course of prednisone treatment, which she took different tan prescribed, well tolerated. She has been using a humidifier in the bedroom and taking Mucinex.  Asthma on Dulera 200-5 mcg 2 puff bid and Albuterol neb daily prn. She has a follow-up appointment with her pulmonologist next Monday.  During the ER visit, her liver function tests were abnormal. She is reporting intermittent right upper quadrant pain for a couple of years. She has not identified exacerbating or alleviating factors. No associated symptoms. Pain is not radiated. States that she used to drink high amount of alcohol but no hx of abnormal LFT's. Lab Results  Component Value Date   CREATININE 0.66 08/19/2022   BUN 14 08/19/2022   NA 136 08/19/2022   K 3.7 08/19/2022   CL 102 08/19/2022   CO2 25 08/19/2022   Lab Results  Component Value Date   ALT 89 (H) 08/19/2022   AST 55 (H) 08/19/2022   ALKPHOS 165 (H) 08/19/2022   BILITOT 0.4 08/19/2022   Lab Results  Component Value Date   WBC 9.6 08/19/2022   HGB 13.9 08/19/2022   HCT 41.6 08/19/2022   MCV 93.9 08/19/2022   PLT 343 08/19/2022   HTN on Amlodipine 5 mg daily. She reports that her blood pressure has been  well-controlled. Negative for severe/frequent headache, visual changes, focal weakness, or edema.  Review of Systems  Constitutional:  Positive for fatigue. Negative for chills.  HENT:  Negative for mouth sores, nosebleeds and trouble swallowing.   Gastrointestinal:  Negative for abdominal pain, nausea and vomiting.       Negative for changes in bowel habits.  Genitourinary:  Negative for decreased urine volume, dysuria and hematuria.  Skin:  Negative for rash.  Allergic/Immunologic: Positive for environmental allergies.  Neurological:  Negative for syncope, facial asymmetry and weakness.  See other pertinent positives and negatives in HPI.  Current Outpatient Medications on File Prior to Visit  Medication Sig Dispense Refill   Aflibercept (EYLEA) 2 MG/0.05ML SOSY by Intravitreal route. Given by eye doctor.     albuterol (PROVENTIL) (2.5 MG/3ML) 0.083% nebulizer solution Take 3 mLs (2.5 mg total) by nebulization every 6 (six) hours as needed for wheezing or shortness of breath. 75 mL 1   amLODipine (NORVASC) 5 MG tablet Take 1 tablet (5 mg total) by mouth daily. 90 tablet 3   bimatoprost (LUMIGAN) 0.01 % SOLN Place 1 drop into both eyes at bedtime.     dorzolamide (TRUSOPT) 2 % ophthalmic solution Place 1 drop into both eyes 3 (three) times daily.     DULERA 200-5 MCG/ACT AERO USE 2 INHALATIONS TWICE A DAY (MUST HAVE OFFICE VISIT FOR FURTHER REFILLS) 39 g 3   FLUoxetine (PROZAC) 40 MG capsule TAKE 1 CAPSULE BY MOUTH EVERY DAY 90 capsule 2  gabapentin (NEURONTIN) 300 MG capsule TAKE 1 CAPSULE BY MOUTH EVERY DAY AS NEEDED 30 capsule 2   LORazepam (ATIVAN) 0.5 MG tablet Take 0.5 mg by mouth daily as needed.     meloxicam (MOBIC) 15 MG tablet TAKE ONE TABLET BY MOUTH DAILY AS NEEDED **NEED INSURANCE** 30 tablet 0   montelukast (SINGULAIR) 10 MG tablet TAKE 1 TABLET BY MOUTH EVERY DAY AT BEDTIME 90 tablet 1   nystatin (MYCOSTATIN) 100000 UNIT/ML suspension Take 5 mLs (500,000 Units total) by  mouth 3 (three) times daily. 473 mL 1   predniSONE (DELTASONE) 20 MG tablet Take 2 tablets (40 mg total) by mouth daily. 10 tablet 0   timolol (TIMOPTIC-XR) 0.25 % ophthalmic gel-forming Place 1 drop into the left eye daily.     No current facility-administered medications on file prior to visit.   Past Medical History:  Diagnosis Date   Arthritis    Asthma    Basal cell carcinoma    Nose, forehead & left shoulder   Depression    Glaucoma    History of colon polyps    Hypertension    Allergies  Allergen Reactions   Simbrinza [Brinzolamide-Brimonidine]    Social History   Socioeconomic History   Marital status: Widowed    Spouse name: Not on file   Number of children: 3   Years of education: Not on file   Highest education level: Not on file  Occupational History   Occupation: Retired  Tobacco Use   Smoking status: Never   Smokeless tobacco: Never  Vaping Use   Vaping Use: Never used  Substance and Sexual Activity   Alcohol use: Yes    Comment: socially   Drug use: Never   Sexual activity: Not Currently  Other Topics Concern   Not on file  Social History Narrative   Not on file   Social Determinants of Health   Financial Resource Strain: Low Risk  (11/08/2021)   Overall Financial Resource Strain (CARDIA)    Difficulty of Paying Living Expenses: Not hard at all  Food Insecurity: No Food Insecurity (11/08/2021)   Hunger Vital Sign    Worried About Running Out of Food in the Last Year: Never true    [place] in the Last Year: Never true  Transportation Needs: No Transportation Needs (11/08/2021)   PRAPARE - Hydrologist (Medical): No    Lack of Transportation (Non-Medical): No  Physical Activity: Insufficiently Active (11/08/2021)   Exercise Vital Sign    Days of Exercise per Week: 2 days    Minutes of Exercise per Session: 10 min  Stress: No Stress Concern Present (11/08/2021)   [place]    Feeling of Stress : Not at all  Social Connections: Socially Isolated (11/08/2021)   Social Connection and Isolation Panel [NHANES]    Frequency of Communication with Friends and Family: More than three times a week    Frequency of Social Gatherings with Friends and Family: More than three times a week    Attends Religious Services: Never    Marine scientist or Organizations: No    Attends Archivist Meetings: Never    Marital Status: Widowed   Vitals:   08/27/22 1204  BP: 130/80  Pulse: 68  Resp: 16  SpO2: 98%   Wt Readings from Last 3 Encounters:  08/27/22 207 lb (93.9 kg)  08/24/22 207 lb 12.8 oz (94.3  kg)  08/14/22 209 lb 8 oz (95 kg)  Body mass index is 37.86 kg/m. Physical Exam Vitals and nursing note reviewed.  Constitutional:      General: She is not in acute distress.    Appearance: She is well-developed.  HENT:     Head: Normocephalic and atraumatic.     Right Ear: Ear canal and external ear normal. A middle ear effusion is present.     Left Ear: Tympanic membrane, ear canal and external ear normal.     Nose: Rhinorrhea present. No congestion.     Right Sinus: No maxillary sinus tenderness or frontal sinus tenderness.     Left Sinus: No maxillary sinus tenderness or frontal sinus tenderness.     Mouth/Throat:     Mouth: Mucous membranes are moist.     Pharynx: Uvula midline. Posterior oropharyngeal erythema present. No oropharyngeal exudate or uvula swelling.  Eyes:     Conjunctiva/sclera: Conjunctivae normal.  Cardiovascular:     Rate and Rhythm: Normal rate and regular rhythm.     Heart sounds: No murmur heard. Pulmonary:     Effort: Pulmonary effort is normal. No respiratory distress.     Breath sounds: Normal breath sounds.  Lymphadenopathy:     Cervical: No cervical adenopathy.  Skin:    General: Skin is warm.     Findings: No erythema.  Neurological:     General: No focal deficit present.      Mental Status: She is alert and oriented to person, place, and time.   ASSESSMENT AND PLAN:  [name] was seen today for follow-up.  Diagnoses and all orders for this visit: Orders Placed This Encounter  Procedures   US Abdomen Limited RUQ (LIVER/GB)   Hypertension, essential, benign BP adequately controlled. Continue amlodipine 5 mg daily and low-salt diet. Eye exam is current.  Moderate persistent asthma without complication Symptoms of acute respiratory illness and asthma exacerbation have resolved. Continue Dulera 200-5 mcg 2 puff bid and Albuterol neb daily as needed. I do not think she needs to have more prednisone. She has an appt with her pulmonologist Monday. Instructed about warning signs.  RUQ abdominal pain Chronic. We discussed possible etiologies, including musculoskeletal and gallbladder disease. Given her abnormal LFT's  and past hx of high alcohol intake , RUQ [place] will be arranged.  Abnormal LFTs (liver function tests) Continue avoiding alcohol. Recommend holding on Meloxicam for now. RUQ [place] will be arranged. Will plan on repeating LFT's next f/u visit, 4 weeks.  Dysfunction of right eustachian tube Because risk of side effects, recommend avoiding OTC decongestants. Auto inflation maneuver a few times through the day.  Return in about 4 weeks (around 09/24/2022) for CPE.  Karyn Brull G. [place], MD  [facility]. [place] office.

## 2022-08-27 ENCOUNTER — Encounter: Payer: Self-pay | Admitting: Family Medicine

## 2022-08-27 ENCOUNTER — Ambulatory Visit (INDEPENDENT_AMBULATORY_CARE_PROVIDER_SITE_OTHER): Payer: Medicare Other | Admitting: Family Medicine

## 2022-08-27 VITALS — BP 130/80 | HR 68 | Resp 16 | Ht 62.0 in | Wt 207.0 lb

## 2022-08-27 DIAGNOSIS — J4540 Moderate persistent asthma, uncomplicated: Secondary | ICD-10-CM | POA: Diagnosis not present

## 2022-08-27 DIAGNOSIS — R1011 Right upper quadrant pain: Secondary | ICD-10-CM | POA: Diagnosis not present

## 2022-08-27 DIAGNOSIS — R7989 Other specified abnormal findings of blood chemistry: Secondary | ICD-10-CM

## 2022-08-27 DIAGNOSIS — H6991 Unspecified Eustachian tube disorder, right ear: Secondary | ICD-10-CM

## 2022-08-27 DIAGNOSIS — I10 Essential (primary) hypertension: Secondary | ICD-10-CM

## 2022-08-27 NOTE — Patient Instructions (Addendum)
A few things to remember from today's visit:  Hypertension, essential, benign  Moderate persistent asthma without complication  Abnormal LFTs (liver function tests)  RUQ abdominal pain - Plan: US Abdomen Limited RUQ (LIVER/GB)  Continue plain Mucinex. Pop your ears a few times per day, for 1-2 seconds. Monitor for fever. Liver ultrasound will be arranged.  If you need refills for medications you take chronically, please call your pharmacy. Do not use My Chart to request refills or for acute issues that need immediate attention. If you send a my chart message, it may take a few days to be addressed, specially if I am not in the office.  Stop Meloxicam until liver function is rechecked and normalized.  Please be sure medication list is accurate. If a new problem present, please set up appointment sooner than planned today.

## 2022-08-27 NOTE — Assessment & Plan Note (Signed)
BP adequately controlled. Continue amlodipine 5 mg daily and low-salt diet. Eye exam is current.

## 2022-08-27 NOTE — Assessment & Plan Note (Addendum)
Symptoms of acute respiratory illness and asthma exacerbation have resolved. Continue Dulera 200-5 mcg 2 puff bid and Albuterol neb daily as needed. I do not think she needs to have more prednisone. She has an appt with her pulmonologist Monday. Instructed about warning signs.

## 2022-08-27 NOTE — Assessment & Plan Note (Signed)
Because risk of side effects, recommend avoiding OTC decongestants. Auto inflation maneuver a few times through the day.

## 2022-08-27 NOTE — Assessment & Plan Note (Signed)
Continue avoiding alcohol. Recommend holding on Meloxicam for now. RUQ [place] will be arranged. Will plan on repeating LFT's next f/u visit, 4 weeks.

## 2022-08-27 NOTE — Assessment & Plan Note (Signed)
Chronic. We discussed possible etiologies, including musculoskeletal and gallbladder disease. Given her abnormal LFT's  and past hx of high alcohol intake , RUQ [place] will be arranged.

## 2022-08-30 ENCOUNTER — Ambulatory Visit (HOSPITAL_BASED_OUTPATIENT_CLINIC_OR_DEPARTMENT_OTHER)
Admission: RE | Admit: 2022-08-30 | Discharge: 2022-08-30 | Disposition: A | Discharge status: Home / Self Care | Payer: Medicare Other | Source: Ambulatory Visit | Attending: Family Medicine | Admitting: Family Medicine

## 2022-08-30 DIAGNOSIS — K8020 Calculus of gallbladder without cholecystitis without obstruction: Secondary | ICD-10-CM | POA: Diagnosis not present

## 2022-08-30 DIAGNOSIS — R945 Abnormal results of liver function studies: Secondary | ICD-10-CM | POA: Diagnosis not present

## 2022-08-30 DIAGNOSIS — R1011 Right upper quadrant pain: Secondary | ICD-10-CM | POA: Diagnosis not present

## 2022-08-31 ENCOUNTER — Encounter: Payer: Self-pay | Admitting: Family Medicine

## 2022-09-03 ENCOUNTER — Ambulatory Visit (INDEPENDENT_AMBULATORY_CARE_PROVIDER_SITE_OTHER): Payer: Medicare Other | Admitting: Primary Care

## 2022-09-03 ENCOUNTER — Telehealth: Payer: Self-pay | Admitting: Primary Care

## 2022-09-03 ENCOUNTER — Telehealth: Payer: Self-pay | Admitting: Pharmacist

## 2022-09-03 ENCOUNTER — Encounter: Payer: Self-pay | Admitting: Primary Care

## 2022-09-03 VITALS — BP 138/78 | HR 65 | Temp 99.3°F | Ht 62.0 in | Wt 207.4 lb

## 2022-09-03 DIAGNOSIS — H938X1 Other specified disorders of right ear: Secondary | ICD-10-CM | POA: Diagnosis not present

## 2022-09-03 DIAGNOSIS — B338 Other specified viral diseases: Secondary | ICD-10-CM | POA: Insufficient documentation

## 2022-09-03 DIAGNOSIS — J4540 Moderate persistent asthma, uncomplicated: Secondary | ICD-10-CM

## 2022-09-03 MED ORDER — DULERA 200-5 MCG/ACT IN AERO: INHALATION_SPRAY | RESPIRATORY_TRACT | 3 refills | Status: DC

## 2022-09-03 MED ORDER — MONTELUKAST SODIUM 10 MG PO TABS: 10.0000 mg | ORAL_TABLET | Freq: Every day | ORAL | 1 refills | Status: AC

## 2022-09-03 NOTE — Assessment & Plan Note (Signed)
-   DX with RSV two weeks ago. Treated with prednisone course. Clinical symptoms have mostly resolved. CXR showed no acute process

## 2022-09-03 NOTE — Assessment & Plan Note (Signed)
-   No evidence of mid ear effusion  - Recommend starting flonase nasal spray daily

## 2022-09-03 NOTE — Telephone Encounter (Signed)
Can we see if patient can change to home Nucala injection

## 2022-09-03 NOTE — Patient Instructions (Addendum)
Recommendation: - Continue Dulera 2 puffs morning and evening - Continue Singulair '10mg'$  at bedtime  - Use albuterol every 4-6 hours as needed for breakthrough shortness of breaht - Start flonase nasal spray - We will check about home injections for Nucala  - Recommend you get RSV vaccine with your pharamcy   Rx: - Refill Dulera   Follow-up: - 1 year with Dr. Lamonte Sakai or sooner if needed    Asthma, Adult  Asthma is a condition that causes swelling and narrowing of the airways. These are the passages that lead from the nose and mouth down into the lungs. When asthma symptoms get worse it is called an asthma attack or flare. This can make it hard to breathe. Asthma flares can range from minor to life-threatening. There is no cure for asthma, but medicines and lifestyle changes can help to control it. What are the causes? It is not known exactly what causes asthma, but certain things can cause asthma symptoms to get worse (triggers). What can trigger an asthma attack? Cigarette smoke. Mold. Dust. Your pet's skin flakes (dander). Cockroaches. Pollen. Air pollution (like household cleaners, wood smoke, smog, or Advertising account planner). What are the signs or symptoms? Trouble breathing (shortness of breath). Coughing. Making high-pitched whistling sounds when you breathe, most often when you breathe out (wheezing). Chest tightness. Tiredness with little activity. Poor exercise tolerance. How is this treated? Controller medicines that help prevent asthma symptoms. Fast-acting reliever or rescue medicines. These give short-term relief of asthma symptoms. Allergy medicines if your attacks are brought on by allergens. Medicines to help control the body's defense (immune) system. Staying away from the things that cause asthma attacks. Follow these instructions at home: Avoiding triggers in your home Do not allow anyone to smoke in your home. Limit use of fireplaces and wood stoves. Get rid of  pests (such as roaches and mice) and their droppings. Keep your home clean. Clean your floors. Dust regularly. Use cleaning products that do not smell. Wash bed sheets and blankets every week in hot water. Dry them in a dryer. Have someone vacuum when you are not home. Change your heating and air conditioning filters often. Use blankets that are made of polyester or cotton. General instructions Take over-the-counter and prescription medicines only as told by your doctor. Do not smoke or use any products that contain nicotine or tobacco. If you need help quitting, ask your doctor. Stay away from secondhand smoke. Avoid doing things outdoors when allergen counts are high and when air quality is low. Warm up before you exercise. Take time to cool down after exercise. Use a peak flow meter as told by your doctor. A peak flow meter is a tool that measures how well your lungs are working. Keep track of the peak flow meter's readings. Write them down. Follow your asthma action plan. This is a written plan for taking care of your asthma and treating your attacks. Make sure you get all the shots (vaccines) that your doctor recommends. Ask your doctor about a flu shot and a pneumonia shot. Keep all follow-up visits. Contact a doctor if: You have wheezing, shortness of breath, or a cough even while taking medicine to prevent attacks. The mucus you cough up (sputum) is thicker than usual. The mucus you cough up changes from clear or white to yellow, green, gray, or is bloody. You have problems from the medicine you are taking, such as: A rash. Itching. Swelling. Trouble breathing. You need reliever medicines more than 2-3  times a week. Your peak flow reading is still at 50-79% of your personal best after following the action plan for 1 hour. You have a fever. Get help right away if: You seem to be worse and are not responding to medicine during an asthma attack. You are short of breath even at  rest. You get short of breath when doing very little activity. You have trouble eating, drinking, or talking. You have chest pain or tightness. You have a fast heartbeat. Your lips or fingernails start to turn blue. You are light-headed or dizzy, or you faint. Your peak flow is less than 50% of your personal best. You feel too tired to breathe normally. These symptoms may be an emergency. Get help right away. Call 911. Do not wait to see if the symptoms will go away. Do not drive yourself to the hospital. Summary Asthma is a long-term (chronic) condition in which the airways get tight and narrow. An asthma attack can make it hard to breathe. Asthma cannot be cured, but medicines and lifestyle changes can help control it. Make sure you understand how to avoid triggers and how and when to use your medicines. Avoid things that can cause allergy symptoms (allergens). These include animal skin flakes (dander) and pollen from trees or grass. Avoid things that pollute the air. These may include household cleaners, wood smoke, smog, or chemical odors. This information is not intended to replace advice given to you by your health care provider. Make sure you discuss any questions you have with your health care provider. Document Revised: 06/12/2021 Document Reviewed: 06/12/2021 Elsevier Patient Education  [place].

## 2022-09-03 NOTE — Telephone Encounter (Signed)
Patient currently receives Nucala at infusion center and is interested in self-administration. Submitted a Prior Authorization request to [facility] for [place] via CoverMyMeds. Will update once we receive a response.  Key: [id number]  Knox Saliva, PharmD, MPH, BCPS, CPP Clinical Pharmacist (Rheumatology and Pulmonology)

## 2022-09-03 NOTE — Assessment & Plan Note (Addendum)
-   Overall stable interval; Asthma symptoms are well controlled on Dulera 270mg two puffs BID, Singulair '10mg'$  qhs and Nucala q4 weeks. Rare SABA use - We will refill Dulera and look into getting home injections for Nucala - Advised patient get RSV vaccine with her pharamcy  - FU in 1 year or sooner if needed

## 2022-09-03 NOTE — Progress Notes (Signed)
$'@Patient'W$  ID: Ana Hughes, female    DOB: 1951/03/08, 71 y.o.   MRN: [id number]  Chief Complaint  Patient presents with   Follow-up    Dx with RSV.   seen in ED 08/19/2022.  R Ear feels full.  Cough and congestion is improved.  Needs refill on Dulera.    Referring provider: [place], Betty G, MD  HPI: 71 year old female. Never smoked. PMH significant for asthma. Patient of Dr. Lamonte Sakai.  Previous [facility] pulmonary encounter: ROV 04/27/20 --71 year old woman with a history of moderate persistent asthma as well as upper airway irritation syndrome, allergic rhinitis.  She is on [place].  Started Singulair 06/2019. She has not flared in the last year. She has been doing fairly well.  She has had some scratchy throat and some daytime cough. She had some sneezing this am. She has albuterol, uses rarely, last time was months ago. Good functional capacity.   ROV 06/22/21 --visit for 71 year old woman with a history of asthma and upper airway irritation syndrome, allergic rhinitis.  She has been maintained on [place], Dulera and Singulair. Today she reports that  she has been doing very well. She never needs albuterol. No flares since last time. Nucala on a schedule - good supply. She is dealing with thrush, just diagnosed, is about to start nystatin.  Flu shot up to date.  Will get the COVID booster this Fall.   09/03/2022- Interim hx  Patient presents today for OV follow-up. She was seen in ED on 08/19/22 for asthma exacerbation secondary to RSV. Treated with albuterol nebulizer treatment and given prednisone course. CXR showed no active cardiopulmonary disease. She is maintained on Dulera, [place] and Nucala.   Cough and congestion are better  Still experiencing right ear fullness She was taking mucinex but he symptoms are no longer that bothersome to her She is compliant with Dulera two puff twice daily, needs refill  She is receiving Nucala injections monthly in office No Albuterol use     Allergies  Allergen Reactions   Simbrinza [Brinzolamide-Brimonidine]     Immunization History  Administered Date(s) Administered   Fluad Quad(high Dose 65+) 06/22/2019, 08/31/2020, 06/20/2021, 06/20/2022   Influenza, High Dose Seasonal PF 07/03/2018   PFIZER Comirnaty(Gray Top)Covid-19 Tri-Sucrose Vaccine 04/14/2021, 06/20/2022   PFIZER(Purple Top)SARS-COV-2 Vaccination 10/22/2019, 11/12/2019, 06/10/2020, 04/14/2021   PNEUMOCOCCAL CONJUGATE-20 06/20/2022   Tdap 10/01/2015   Zoster Recombinat (Shingrix) 12/05/2020    Past Medical History:  Diagnosis Date   Arthritis    Asthma    Basal cell carcinoma    Nose, forehead & left shoulder   Depression    Glaucoma    History of colon polyps    Hypertension     Tobacco History: Social History   Tobacco Use  Smoking Status Never  Smokeless Tobacco Never   Counseling given: Not Answered   Outpatient Medications Prior to Visit  Medication Sig Dispense Refill   Aflibercept (EYLEA) 2 MG/0.05ML SOSY by Intravitreal route. Given by eye doctor.     albuterol (PROVENTIL) (2.5 MG/3ML) 0.083% nebulizer solution Take 3 mLs (2.5 mg total) by nebulization every 6 (six) hours as needed for wheezing or shortness of breath. 75 mL 1   amLODipine (NORVASC) 5 MG tablet Take 1 tablet (5 mg total) by mouth daily. 90 tablet 3   bimatoprost (LUMIGAN) 0.01 % SOLN Place 1 drop into both eyes at bedtime.     dorzolamide (TRUSOPT) 2 % ophthalmic solution Place 1 drop into both eyes 3 (three)  times daily.     FLUoxetine (PROZAC) 40 MG capsule TAKE 1 CAPSULE BY MOUTH EVERY DAY 90 capsule 2   gabapentin (NEURONTIN) 300 MG capsule TAKE 1 CAPSULE BY MOUTH EVERY DAY AS NEEDED 30 capsule 2   LORazepam (ATIVAN) 0.5 MG tablet Take 0.5 mg by mouth daily as needed.     timolol (TIMOPTIC-XR) 0.25 % ophthalmic gel-forming Place 1 drop into the left eye daily.     DULERA 200-5 MCG/ACT AERO USE 2 INHALATIONS TWICE A DAY (MUST HAVE OFFICE VISIT FOR FURTHER  REFILLS) 39 g 3   montelukast (SINGULAIR) 10 MG tablet TAKE 1 TABLET BY MOUTH EVERY DAY AT BEDTIME 90 tablet 1   nystatin (MYCOSTATIN) 100000 UNIT/ML suspension Take 5 mLs (500,000 Units total) by mouth 3 (three) times daily. (Patient not taking: Reported on 09/03/2022) 473 mL 1   meloxicam (MOBIC) 15 MG tablet TAKE ONE TABLET BY MOUTH DAILY AS NEEDED **NEED INSURANCE** 30 tablet 0   predniSONE (DELTASONE) 20 MG tablet Take 2 tablets (40 mg total) by mouth daily. (Patient not taking: Reported on 09/03/2022) 10 tablet 0   No facility-administered medications prior to visit.   Review of Systems  Review of Systems  Constitutional: Negative.   HENT:  Negative for congestion and ear pain.        Right ear fullness  Respiratory:  Negative for cough, chest tightness, shortness of breath and wheezing.   Cardiovascular: Negative.    Physical Exam  BP 138/78 (BP Location: Left Arm, Patient Position: Sitting, Cuff Size: Large)   Pulse 65   Temp 99.3 F (37.4 C) (Oral)   Ht '5\' 2"'$  (1.575 m)   Wt 207 lb 6.4 oz (94.1 kg)   SpO2 98%   BMI 37.93 kg/m  Physical Exam Constitutional:      Appearance: Normal appearance.  HENT:     Head: Normocephalic and atraumatic.     Right Ear: Tympanic membrane normal. There is no impacted cerumen.     Left Ear: Tympanic membrane normal. There is no impacted cerumen.     Mouth/Throat:     Pharynx: Oropharynx is clear.  Cardiovascular:     Rate and Rhythm: Normal rate and regular rhythm.  Pulmonary:     Effort: Pulmonary effort is normal.     Breath sounds: Normal breath sounds.  Musculoskeletal:        General: Normal range of motion.  Skin:    General: Skin is warm and dry.  Neurological:     General: No focal deficit present.     Mental Status: She is alert and oriented to person, place, and time. Mental status is at baseline.  Psychiatric:        Mood and Affect: Mood normal.        Behavior: Behavior normal.        Thought Content: Thought  content normal.        Judgment: Judgment normal.      Lab Results:  CBC    Component Value Date/Time   WBC 9.6 08/19/2022 1749   RBC 4.43 08/19/2022 1749   HGB 13.9 08/19/2022 1749   HCT 41.6 08/19/2022 1749   PLT 343 08/19/2022 1749   MCV 93.9 08/19/2022 1749   MCH 31.4 08/19/2022 1749   MCHC 33.4 08/19/2022 1749   RDW 12.3 08/19/2022 1749    BMET    Component Value Date/Time   NA 136 08/19/2022 1749   K 3.7 08/19/2022 1749   CL 102 08/19/2022 1749  CO2 25 08/19/2022 1749   GLUCOSE 103 (H) 08/19/2022 1749   BUN 14 08/19/2022 1749   CREATININE 0.66 08/19/2022 1749   CREATININE 0.55 08/31/2020 1303   CALCIUM 9.4 08/19/2022 1749   GFRNONAA >60 08/19/2022 1749   GFRNONAA 97 08/31/2020 1303   GFRAA 112 08/31/2020 1303    BNP No results found for: "BNP"  ProBNP No results found for: "PROBNP"  Imaging: US Abdomen Limited RUQ (LIVER/GB)  Result Date: 08/30/2022 CLINICAL DATA:  Right upper quadrant pain.  Abnormal LFTs. EXAM: ULTRASOUND ABDOMEN LIMITED RIGHT UPPER QUADRANT COMPARISON:  None Available. FINDINGS: Gallbladder: Cholelithiasis without wall thickening, Murphy's sign, or pericholecystic fluid. Common bile duct: Diameter: 6.2 mm Liver: Diffuse increased echogenicity. No focal mass. Portal vein is patent on color Doppler imaging with normal direction of blood flow towards the liver. Other: None. IMPRESSION: 1. The common bile duct is borderline in size measuring 6.2 mm. Recommend correlation with LFTs to exclude signs of obstruction. If there is concern for obstruction, recommend MRCP. 2. Cholelithiasis in an otherwise normal appearing gallbladder. 3. Diffuse increased echogenicity throughout the liver is nonspecific but often due to hepatic steatosis. Other causes of intrinsic liver disease may result increased echogenicity. Electronically Signed   By: Dorise Bullion III M.D.   On: 08/30/2022 18:12   DG Chest 2 View  Result Date: 08/19/2022 CLINICAL DATA:   Cough and congestion. EXAM: CHEST - 2 VIEW COMPARISON:  08/19/2022 and prior studies FINDINGS: The cardiomediastinal silhouette is unremarkable. Elevation of the RIGHT hemidiaphragm noted. There is no evidence of focal airspace disease, pulmonary edema, suspicious pulmonary nodule/mass, pleural effusion, or pneumothorax. No acute bony abnormalities are identified. IMPRESSION: No active cardiopulmonary disease. Electronically Signed   By: Margarette Canada M.D.   On: 08/19/2022 18:00     Assessment & Plan:   Moderate persistent asthma without complication - Overall stable interval; Asthma symptoms are well controlled on Dulera 272mg two puffs BID, Singulair '10mg'$  qhs and Nucala q4 weeks. Rare SABA use - We will refill Dulera and look into getting home injections for Nucala - Advised patient get RSV vaccine with her pharamcy  - FU in 1 year or sooner if needed  Ear fullness, right - No evidence of mid ear effusion  - Recommend starting flonase nasal spray daily   RSV (respiratory syncytial virus infection) - DX with RSV two weeks ago. Treated with prednisone course. Clinical symptoms have mostly resolved. CXR showed no acute process   EMartyn Ehrich NP 09/03/2022

## 2022-09-03 NOTE — Telephone Encounter (Signed)
Will look into Nucala for self-administration  Knox Saliva, PharmD, MPH, BCPS, CPP Clinical Pharmacist (Rheumatology and Pulmonology)

## 2022-09-06 DIAGNOSIS — S29012A Strain of muscle and tendon of back wall of thorax, initial encounter: Secondary | ICD-10-CM | POA: Diagnosis not present

## 2022-09-06 DIAGNOSIS — M9905 Segmental and somatic dysfunction of pelvic region: Secondary | ICD-10-CM | POA: Diagnosis not present

## 2022-09-06 DIAGNOSIS — M9903 Segmental and somatic dysfunction of lumbar region: Secondary | ICD-10-CM | POA: Diagnosis not present

## 2022-09-06 DIAGNOSIS — M9902 Segmental and somatic dysfunction of thoracic region: Secondary | ICD-10-CM | POA: Diagnosis not present

## 2022-09-06 DIAGNOSIS — M5136 Other intervertebral disc degeneration, lumbar region: Secondary | ICD-10-CM | POA: Diagnosis not present

## 2022-09-06 DIAGNOSIS — M5137 Other intervertebral disc degeneration, lumbosacral region: Secondary | ICD-10-CM | POA: Diagnosis not present

## 2022-09-06 DIAGNOSIS — M9901 Segmental and somatic dysfunction of cervical region: Secondary | ICD-10-CM | POA: Diagnosis not present

## 2022-09-06 DIAGNOSIS — M5032 Other cervical disc degeneration, mid-cervical region, unspecified level: Secondary | ICD-10-CM | POA: Diagnosis not present

## 2022-09-13 NOTE — Telephone Encounter (Signed)
Received a fax regarding Prior Authorization from [facility] for (self administered) NUCALA. Authorization has been DENIED because coverage through Ana Hughes's current Medicare plan does NOT include outpatient medications. However, denial letter indicates a possibility that Ana Hughes may have alternative prescription coverage through a separate carrier.   Eligibility search reveals Rx benefit plan info through [employer]--ATC Ana Hughes to discuss the current status of her [employer] benefits, LVM requesting return call.  Will await f/u.

## 2022-09-14 ENCOUNTER — Other Ambulatory Visit (HOSPITAL_COMMUNITY): Payer: Self-pay

## 2022-09-18 ENCOUNTER — Other Ambulatory Visit: Payer: Self-pay

## 2022-09-18 DIAGNOSIS — I10 Essential (primary) hypertension: Secondary | ICD-10-CM

## 2022-09-18 DIAGNOSIS — F331 Major depressive disorder, recurrent, moderate: Secondary | ICD-10-CM

## 2022-09-18 DIAGNOSIS — G8929 Other chronic pain: Secondary | ICD-10-CM

## 2022-09-18 MED ORDER — GABAPENTIN 300 MG PO CAPS: ORAL_CAPSULE | ORAL | 2 refills | Status: DC

## 2022-09-18 MED ORDER — FLUOXETINE HCL 40 MG PO CAPS: ORAL_CAPSULE | ORAL | 2 refills | Status: DC

## 2022-09-18 MED ORDER — AMLODIPINE BESYLATE 5 MG PO TABS: 5.0000 mg | ORAL_TABLET | Freq: Every day | ORAL | 3 refills | Status: DC

## 2022-09-19 ENCOUNTER — Other Ambulatory Visit (HOSPITAL_COMMUNITY): Payer: Self-pay

## 2022-09-19 ENCOUNTER — Encounter: Payer: Self-pay | Admitting: Emergency Medicine

## 2022-09-19 MED ORDER — DULERA 200-5 MCG/ACT IN AERO: INHALATION_SPRAY | RESPIRATORY_TRACT | 3 refills | Status: DC

## 2022-09-19 MED ORDER — DULERA 200-5 MCG/ACT IN AERO: 2.0000 | INHALATION_SPRAY | Freq: Two times a day (BID) | RESPIRATORY_TRACT | 3 refills | Status: DC

## 2022-09-19 NOTE — Telephone Encounter (Signed)
Attempted to run test claim for Nucala through [employer] but has rejection stating claim has to be processed through primary payer first  I called [facility] and tech stated that they billed same [employer] plan we are pulling through. This was processed as her primary last year but they have not sent through any claims this year.   I called patient again to advise of this and that we are not sure how to move forward without insurance information. I advised her to call [employer] to determine why her benefits are coordinated potentially incorrectly that deos not allow [place] to bill her [employer] solely for rx coverage. Patient picked up physical insurance card copy while I was on the phone with her today and it states Part B coverage only. I advised her that she would have to do legwork  She also request Dulera rx be resent because [facility] stated they didn't have rx.  Patient advised that if she finds her primary rx coverage, then she can let us know but at this we will close encounter. She is comfortable with continuing to receive at infusion center and prefers this over going through trouble of calling [employer] at this time.  Knox Saliva, PharmD, MPH, BCPS, CPP Clinical Pharmacist (Rheumatology and Pulmonology)

## 2022-09-20 ENCOUNTER — Ambulatory Visit (INDEPENDENT_AMBULATORY_CARE_PROVIDER_SITE_OTHER): Payer: Medicare Other

## 2022-09-20 VITALS — BP 125/83 | HR 55 | Temp 97.7°F | Resp 20 | Ht 62.0 in | Wt 206.0 lb

## 2022-09-20 DIAGNOSIS — J4540 Moderate persistent asthma, uncomplicated: Secondary | ICD-10-CM | POA: Diagnosis not present

## 2022-09-20 MED ORDER — MEPOLIZUMAB 100 MG ~~LOC~~ SOLR
100.0000 mg | Freq: Once | SUBCUTANEOUS | Status: AC
  Administered 2022-09-20: 100 mg via SUBCUTANEOUS
  Filled 2022-09-20: qty 1

## 2022-09-20 NOTE — Progress Notes (Signed)
Diagnosis: Asthma  Provider:  Marshell Garfinkel MD  Procedure: Injection  Nucala (Mepolizumab), Dose: 100 mg, Site: subcutaneous, Number of injections: 1  Post Care:  n/a  Discharge: Condition: Good, Destination: Home . AVS provided to patient.   Performed by:  Cleophus Molt, RN

## 2022-09-24 DIAGNOSIS — H34813 Central retinal vein occlusion, bilateral, with macular edema: Secondary | ICD-10-CM | POA: Diagnosis not present

## 2022-09-24 DIAGNOSIS — H35371 Puckering of macula, right eye: Secondary | ICD-10-CM | POA: Diagnosis not present

## 2022-09-24 DIAGNOSIS — H43813 Vitreous degeneration, bilateral: Secondary | ICD-10-CM | POA: Diagnosis not present

## 2022-09-24 DIAGNOSIS — H35033 Hypertensive retinopathy, bilateral: Secondary | ICD-10-CM | POA: Diagnosis not present

## 2022-09-25 DIAGNOSIS — H401122 Primary open-angle glaucoma, left eye, moderate stage: Secondary | ICD-10-CM | POA: Diagnosis not present

## 2022-09-25 DIAGNOSIS — M9903 Segmental and somatic dysfunction of lumbar region: Secondary | ICD-10-CM | POA: Diagnosis not present

## 2022-09-25 DIAGNOSIS — H401111 Primary open-angle glaucoma, right eye, mild stage: Secondary | ICD-10-CM | POA: Diagnosis not present

## 2022-09-25 DIAGNOSIS — S29012A Strain of muscle and tendon of back wall of thorax, initial encounter: Secondary | ICD-10-CM | POA: Diagnosis not present

## 2022-09-25 DIAGNOSIS — M5137 Other intervertebral disc degeneration, lumbosacral region: Secondary | ICD-10-CM | POA: Diagnosis not present

## 2022-09-25 DIAGNOSIS — M9901 Segmental and somatic dysfunction of cervical region: Secondary | ICD-10-CM | POA: Diagnosis not present

## 2022-09-25 DIAGNOSIS — M5032 Other cervical disc degeneration, mid-cervical region, unspecified level: Secondary | ICD-10-CM | POA: Diagnosis not present

## 2022-09-25 DIAGNOSIS — M9902 Segmental and somatic dysfunction of thoracic region: Secondary | ICD-10-CM | POA: Diagnosis not present

## 2022-09-25 DIAGNOSIS — M5136 Other intervertebral disc degeneration, lumbar region: Secondary | ICD-10-CM | POA: Diagnosis not present

## 2022-09-25 DIAGNOSIS — M9905 Segmental and somatic dysfunction of pelvic region: Secondary | ICD-10-CM | POA: Diagnosis not present

## 2022-09-26 NOTE — Progress Notes (Signed)
HPI: [name] is a 72 y.o. female with past medical history significant for asthma, hypertension, hyperlipidemia, vitamin D deficiency, and depression here today with her daughter for her routine physical.  Last CPE: 06/20/21 No new problems since her last visit.  She does not engage in regular exercise but states she cooks at home and usually eats vegetables daily. Her diet consists mainly of chicken.  She sleeps an average of eight to ten hours per night and denies smoking.  She consumes approximately two to three glasses of alcohol per week, either wine or mixed drinks.  Immunization History  Administered Date(s) Administered   Fluad Quad(high Dose 65+) 06/22/2019, 08/31/2020, 06/20/2021, 06/20/2022   Influenza, High Dose Seasonal PF 07/03/2018   PFIZER Comirnaty(Gray Top)Covid-19 Tri-Sucrose Vaccine 04/14/2021, 06/20/2022   PFIZER(Purple Top)SARS-COV-2 Vaccination 10/22/2019, 11/12/2019, 06/10/2020, 04/14/2021   PNEUMOCOCCAL CONJUGATE-20 06/20/2022   Tdap 10/01/2015   Zoster Recombinat (Shingrix) 12/05/2020, 04/14/2021   Health Maintenance  Topic Date Due   Medicare Annual Wellness (AWV)  11/08/2022   COVID-19 Vaccine (6 - 2023-24 season) 10/14/2022 (Originally 08/15/2022)   MAMMOGRAM  12/20/2022   COLONOSCOPY (Pts 45-67yr Insurance coverage will need to be confirmed)  01/03/2025   DTaP/Tdap/Td (2 - Td or Tdap) 09/30/2025   Pneumonia Vaccine 70 Years old  Completed   INFLUENZA VACCINE  Completed   DEXA SCAN  Completed   Hepatitis C Screening  Completed   Zoster Vaccines- Shingrix  Completed   HPV VACCINES  Aged Out   Chronic lower back pain and OA, she has not been on meloxicam 15 mg since last visit, did recommend stopping it due to abnormal LFTs.  Hypertension on amlodipine 5 mg daily. She does not check BP at home but is checked regularly at her pulmonologist office.  Lab Results  Component Value Date   CREATININE 0.66 08/19/2022   BUN 14 08/19/2022    NA 136 08/19/2022   K 3.7 08/19/2022   CL 102 08/19/2022   CO2 25 08/19/2022   Depression and anxiety on fluoxetine 40 mg daily and lorazepam 0.5 mg daily as needed.  Asthma on albuterol nebulizer, Dulera 20-5 mcg 2 puff twice daily. She also receives monthly Nucala injections.  She follows with pulmonologist, last seen on 09/03/2022.  Abnormal liver function and cholelithiasis, the latter one identified in RUQ ultrasound performed in December./2023 surgical consultation was recommended, she has not had appointment information yet. She denies any current abdominal pain or nausea. Lab Results  Component Value Date   ALT 89 (H) 08/19/2022   AST 55 (H) 08/19/2022   ALKPHOS 165 (H) 08/19/2022   BILITOT 0.4 08/19/2022   She has not experienced any falls. Hx of osteopenia. She is currently taking calcium through her diet and vitamin D 1000 units daily. Last DEXA 08/2020.  Hyperlipidemia: Currently she is on nonpharmacologic treatment. Lab Results  Component Value Date   CHOL 239 (H) 06/09/2021   HDL 69.80 06/09/2021   LDLCALC 154 (H) 06/09/2021   TRIG 74.0 06/09/2021   CHOLHDL 3 06/09/2021   Lab Results  Component Value Date   HGBA1C 5.4 06/09/2021   Review of Systems  Constitutional:  Negative for appetite change and fever.  HENT:  Negative for hearing loss, mouth sores, sore throat and voice change.   Eyes:  Negative for redness and visual disturbance.  Respiratory:  Negative for cough, shortness of breath and wheezing.   Cardiovascular:  Negative for chest pain and leg swelling.  Gastrointestinal:  Negative for abdominal  pain, nausea and vomiting.       No changes in bowel habits.  Endocrine: Negative for cold intolerance, heat intolerance, polydipsia, polyphagia and polyuria.  Genitourinary:  Negative for decreased urine volume, dysuria, hematuria, vaginal bleeding and vaginal discharge.  Musculoskeletal:  Positive for arthralgias and back pain. Negative for gait  problem.  Skin:  Negative for color change and rash.  Allergic/Immunologic: Positive for environmental allergies.  Neurological:  Negative for syncope, weakness and headaches.  Hematological:  Negative for adenopathy. Does not bruise/bleed easily.  Psychiatric/Behavioral:  Negative for confusion and hallucinations.   All other systems reviewed and are negative.  Current Outpatient Medications on File Prior to Visit  Medication Sig Dispense Refill   Aflibercept (EYLEA) 2 MG/0.05ML SOSY by Intravitreal route. Given by eye doctor.     albuterol (PROVENTIL) (2.5 MG/3ML) 0.083% nebulizer solution Take 3 mLs (2.5 mg total) by nebulization every 6 (six) hours as needed for wheezing or shortness of breath. 75 mL 1   amLODipine (NORVASC) 5 MG tablet Take 1 tablet (5 mg total) by mouth daily. 90 tablet 3   bimatoprost (LUMIGAN) 0.01 % SOLN Place 1 drop into both eyes at bedtime.     dorzolamide (TRUSOPT) 2 % ophthalmic solution Place 1 drop into both eyes 3 (three) times daily.     FLUoxetine (PROZAC) 40 MG capsule TAKE 1 CAPSULE BY MOUTH EVERY DAY 90 capsule 2   gabapentin (NEURONTIN) 300 MG capsule TAKE 1 CAPSULE BY MOUTH EVERY DAY AS NEEDED 30 capsule 2   LORazepam (ATIVAN) 0.5 MG tablet Take 0.5 mg by mouth daily as needed.     mometasone-formoterol (DULERA) 200-5 MCG/ACT AERO Inhale 2 puffs into the lungs 2 (two) times daily. 39 g 3   montelukast (SINGULAIR) 10 MG tablet Take 1 tablet (10 mg total) by mouth at bedtime. 90 tablet 1   nystatin (MYCOSTATIN) 100000 UNIT/ML suspension Take 5 mLs (500,000 Units total) by mouth 3 (three) times daily. 473 mL 1   timolol (TIMOPTIC-XR) 0.25 % ophthalmic gel-forming Place 1 drop into the left eye daily.     No current facility-administered medications on file prior to visit.   Past Medical History:  Diagnosis Date   Arthritis    Asthma    Basal cell carcinoma    Nose, forehead & left shoulder   Depression    Glaucoma    History of colon polyps     Hypertension     Past Surgical History:  Procedure Laterality Date   CESAREAN SECTION  1975, 1977, 1981   MOHS SURGERY     REPLACEMENT TOTAL KNEE BILATERAL Bilateral    left knee, 2005, right knee 2004   TOTAL HIP ARTHROPLASTY Bilateral 012, right hip, 2013 left hip    Allergies  Allergen Reactions   Simbrinza [Brinzolamide-Brimonidine]     Family History  Problem Relation Age of Onset   Lung cancer Mother    Hypertension Mother    Brain cancer Mother    Prostate cancer Father    Breast cancer Sister 38   Depression Daughter    Drug abuse Daughter    Depression Daughter    Drug abuse Daughter    Heart attack Daughter    Depression Daughter    Drug abuse Daughter    Mental illness Daughter    Colon cancer Neg Hx    Stomach cancer Neg Hx    Esophageal cancer Neg Hx    Colon polyps Neg Hx    Pancreatic cancer Neg  Hx    Liver disease Neg Hx    Social History   Socioeconomic History   Marital status: Widowed    Spouse name: Not on file   Number of children: 3   Years of education: Not on file   Highest education level: Not on file  Occupational History   Occupation: Retired  Tobacco Use   Smoking status: Never   Smokeless tobacco: Never  Vaping Use   Vaping Use: Never used  Substance and Sexual Activity   Alcohol use: Yes    Comment: socially   Drug use: Never   Sexual activity: Not Currently  Other Topics Concern   Not on file  Social History Narrative   Not on file   Social Determinants of Health   Financial Resource Strain: Low Risk  (11/08/2021)   Overall Financial Resource Strain (CARDIA)    Difficulty of Paying Living Expenses: Not hard at all  Food Insecurity: No Food Insecurity (11/08/2021)   Hunger Vital Sign    Worried About Running Out of Food in the Last Year: Never true    [place] in the Last Year: Never true  Transportation Needs: No Transportation Needs (11/08/2021)   PRAPARE - Hydrologist  (Medical): No    Lack of Transportation (Non-Medical): No  Physical Activity: Insufficiently Active (11/08/2021)   Exercise Vital Sign    Days of Exercise per Week: 2 days    Minutes of Exercise per Session: 10 min  Stress: No Stress Concern Present (11/08/2021)   [place]    Feeling of Stress : Not at all  Social Connections: Socially Isolated (11/08/2021)   Social Connection and Isolation Panel [NHANES]    Frequency of Communication with Friends and Family: More than three times a week    Frequency of Social Gatherings with Friends and Family: More than three times a week    Attends Religious Services: Never    Marine scientist or Organizations: No    Attends Archivist Meetings: Never    Marital Status: Widowed   Vitals:   09/28/22 0940  BP: 120/78  Pulse: 72  Resp: 16  Temp: 99 F (37.2 C)  SpO2: 97%  Body mass index is 37.93 kg/m. Wt Readings from Last 3 Encounters:  09/28/22 207 lb 6 oz (94.1 kg)  09/20/22 206 lb (93.4 kg)  09/03/22 207 lb 6.4 oz (94.1 kg)   Physical Exam Vitals and nursing note reviewed.  Constitutional:      General: She is not in acute distress.    Appearance: She is well-developed.  HENT:     Head: Normocephalic and atraumatic.     Right Ear: Tympanic membrane, ear canal and external ear normal.     Left Ear: Tympanic membrane, ear canal and external ear normal.     Mouth/Throat:     Mouth: Mucous membranes are moist.     Pharynx: Oropharynx is clear. Uvula midline.  Eyes:     Conjunctiva/sclera: Conjunctivae normal.     Pupils: Pupils are equal, round, and reactive to light.  Neck:     Thyroid: No thyroid mass.  Cardiovascular:     Rate and Rhythm: Normal rate and regular rhythm.     Pulses:          Dorsalis pedis pulses are 2+ on the right side and 2+ on the left side.     Heart sounds: No  murmur heard. Pulmonary:     Effort: Pulmonary effort is  normal. No respiratory distress.     Breath sounds: Normal breath sounds.  Abdominal:     Palpations: Abdomen is soft. There is no hepatomegaly or mass.     Tenderness: There is no abdominal tenderness.  Musculoskeletal:     Comments: No signs of synovitis appreciated.  Lymphadenopathy:     Cervical: No cervical adenopathy.  Skin:    General: Skin is warm.     Findings: No erythema or rash.  Neurological:     General: No focal deficit present.     Mental Status: She is alert and oriented to person, place, and time.     Cranial Nerves: No cranial nerve deficit.     Coordination: Coordination normal.     Deep Tendon Reflexes:     Reflex Scores:      Bicep reflexes are 2+ on the right side and 2+ on the left side.      Patellar reflexes are 2+ on the right side and 2+ on the left side.    Comments: Antalgic gait, not assisted.  Psychiatric:        Mood and Affect: Mood and affect normal.   ASSESSMENT AND PLAN: [name] was here today annual physical examination.  Orders Placed This Encounter  Procedures   VITAMIN D 25 Hydroxy (Vit-D Deficiency, Fractures)   Hepatic function panel   Basic metabolic panel   Lipid panel   Ambulatory referral to [facility] was seen today for annual exam.  Diagnoses and all orders for this visit:  Routine general medical examination at a health care facility Assessment & Plan: We discussed the importance of regular physical activity and healthy diet for prevention of chronic illness and/or complications. Preventive guidelines reviewed. Vaccination up-to-date. Ca++ and vit D supplementation to continue. Next CPE in a year.   Hyperlipidemia, unspecified hyperlipidemia type Assessment & Plan: She is not on pharmacologic treatment at this time. Low-fat diet recommended for now. Further recommendations will be given according to 10 years CVD risk score and lipid panel numbers.  Orders: -     Lipid panel;  Future  Hypertension, essential, benign Assessment & Plan: BP adequately controlled. Continue amlodipine 5 mg daily and low salt diet. BP check monthly at her pulmonologist office before receiving Nucala injection.  Orders: -     Basic metabolic panel; Future  Major depressive disorder in partial remission, unspecified whether recurrent [facility]) Assessment & Plan: Problem has been stable. Continue fluoxetine 40 mg daily.   Abnormal LFTs (liver function tests) Assessment & Plan: Liver US done in 08/2022 show cholelithiasis, which could explain problem.  Consultation with general surgeon pending to discuss cholecystectomy. Recommend to limit alcohol intake  Orders: -     Hepatic function panel; Future -     Ambulatory referral to General Surgery  Calculus of gallbladder without cholecystitis without obstruction -     Ambulatory referral to General Surgery  Vitamin D deficiency, unspecified Assessment & Plan: Continue current dose of vitamin D supplementation. Further recommendation will be given according to 25 OH vitamin D result.  Orders: -     VITAMIN D 25 Hydroxy (Vit-D Deficiency, Fractures); Future  Chronic bilateral low back pain with sciatica, sciatica laterality unspecified Assessment & Plan: Continue gabapentin 300 mg daily at bedtime. As far as liver function test is not back to normal, recommend avoiding NSAIDs. She can take acetaminophen 500 mg up  to 4 tablets daily. She follows with orthopedics.   Morbid obesity ([place]) Assessment & Plan: Weight has been otherwise stable. Encourage engaging in regular physical activity, tai chi is a good option for her. Continue following a healthful diet.   Return in 6 months (on 03/29/2023).  Asiah Browder G. [place], MD  [facility]. [place] office.

## 2022-09-27 ENCOUNTER — Telehealth: Payer: Self-pay | Admitting: Family Medicine

## 2022-09-27 DIAGNOSIS — M5136 Other intervertebral disc degeneration, lumbar region: Secondary | ICD-10-CM | POA: Diagnosis not present

## 2022-09-27 DIAGNOSIS — M9901 Segmental and somatic dysfunction of cervical region: Secondary | ICD-10-CM | POA: Diagnosis not present

## 2022-09-27 DIAGNOSIS — M9903 Segmental and somatic dysfunction of lumbar region: Secondary | ICD-10-CM | POA: Diagnosis not present

## 2022-09-27 DIAGNOSIS — M9905 Segmental and somatic dysfunction of pelvic region: Secondary | ICD-10-CM | POA: Diagnosis not present

## 2022-09-27 DIAGNOSIS — M5032 Other cervical disc degeneration, mid-cervical region, unspecified level: Secondary | ICD-10-CM | POA: Diagnosis not present

## 2022-09-27 DIAGNOSIS — S29012A Strain of muscle and tendon of back wall of thorax, initial encounter: Secondary | ICD-10-CM | POA: Diagnosis not present

## 2022-09-27 DIAGNOSIS — M5137 Other intervertebral disc degeneration, lumbosacral region: Secondary | ICD-10-CM | POA: Diagnosis not present

## 2022-09-27 DIAGNOSIS — M9902 Segmental and somatic dysfunction of thoracic region: Secondary | ICD-10-CM | POA: Diagnosis not present

## 2022-09-27 NOTE — Telephone Encounter (Signed)
Requesting an order for lab work prior to her CPE scheduled for 09/28/22

## 2022-09-28 ENCOUNTER — Ambulatory Visit (INDEPENDENT_AMBULATORY_CARE_PROVIDER_SITE_OTHER): Payer: Medicare Other | Admitting: Family Medicine

## 2022-09-28 ENCOUNTER — Encounter: Payer: Self-pay | Admitting: Family Medicine

## 2022-09-28 VITALS — BP 120/78 | HR 72 | Temp 99.0°F | Resp 16 | Ht 62.0 in | Wt 207.4 lb

## 2022-09-28 DIAGNOSIS — E559 Vitamin D deficiency, unspecified: Secondary | ICD-10-CM | POA: Diagnosis not present

## 2022-09-28 DIAGNOSIS — R7989 Other specified abnormal findings of blood chemistry: Secondary | ICD-10-CM | POA: Diagnosis not present

## 2022-09-28 DIAGNOSIS — M5441 Lumbago with sciatica, right side: Secondary | ICD-10-CM

## 2022-09-28 DIAGNOSIS — M5440 Lumbago with sciatica, unspecified side: Secondary | ICD-10-CM

## 2022-09-28 DIAGNOSIS — K8020 Calculus of gallbladder without cholecystitis without obstruction: Secondary | ICD-10-CM

## 2022-09-28 DIAGNOSIS — I10 Essential (primary) hypertension: Secondary | ICD-10-CM | POA: Diagnosis not present

## 2022-09-28 DIAGNOSIS — G8929 Other chronic pain: Secondary | ICD-10-CM

## 2022-09-28 DIAGNOSIS — F324 Major depressive disorder, single episode, in partial remission: Secondary | ICD-10-CM | POA: Diagnosis not present

## 2022-09-28 DIAGNOSIS — E785 Hyperlipidemia, unspecified: Secondary | ICD-10-CM | POA: Diagnosis not present

## 2022-09-28 DIAGNOSIS — Z0000 Encounter for general adult medical examination without abnormal findings: Secondary | ICD-10-CM

## 2022-09-28 LAB — VITAMIN D 25 HYDROXY (VIT D DEFICIENCY, FRACTURES): VITD: 24.32 ng/mL — ABNORMAL LOW (ref 30.00–100.00)

## 2022-09-28 LAB — BASIC METABOLIC PANEL
BUN: 11 mg/dL (ref 6–23)
CO2: 28 mEq/L (ref 19–32)
Calcium: 9.5 mg/dL (ref 8.4–10.5)
Chloride: 104 mEq/L (ref 96–112)
Creatinine, Ser: 0.59 mg/dL (ref 0.40–1.20)
GFR: 91.67 mL/min (ref >60.00)
Glucose, Bld: 81 mg/dL (ref 70–99)
Potassium: 3.9 mEq/L (ref 3.5–5.1)
Sodium: 139 mEq/L (ref 135–145)

## 2022-09-28 LAB — LIPID PANEL
Cholesterol: 232 mg/dL — ABNORMAL HIGH (ref 0–200)
HDL: 57.9 mg/dL (ref >39.00)
LDL Cholesterol: 154 mg/dL — ABNORMAL HIGH (ref 0–99)
NonHDL: 174.34
Total CHOL/HDL Ratio: 4
Triglycerides: 102 mg/dL (ref 0.0–149.0)
VLDL: 20.4 mg/dL (ref 0.0–40.0)

## 2022-09-28 LAB — HEPATIC FUNCTION PANEL
ALT: 20 U/L (ref 0–35)
AST: 19 U/L (ref 0–37)
Albumin: 4.1 g/dL (ref 3.5–5.2)
Alkaline Phosphatase: 63 U/L (ref 39–117)
Bilirubin, Direct: 0.1 mg/dL (ref 0.0–0.3)
Total Bilirubin: 0.4 mg/dL (ref 0.2–1.2)
Total Protein: 6.9 g/dL (ref 6.0–8.3)

## 2022-09-28 NOTE — Assessment & Plan Note (Signed)
Problem has been stable. Continue fluoxetine 40 mg daily.

## 2022-09-28 NOTE — Assessment & Plan Note (Signed)
Weight has been otherwise stable. Encourage engaging in regular physical activity, tai chi is a good option for her. Continue following a healthful diet.

## 2022-09-28 NOTE — Assessment & Plan Note (Signed)
She is not on pharmacologic treatment at this time. Low-fat diet recommended for now. Further recommendations will be given according to 10 years CVD risk score and lipid panel numbers.

## 2022-09-28 NOTE — Assessment & Plan Note (Signed)
Continue current dose of vitamin D supplementation. Further recommendation will be given according to 25 OH vitamin D result.

## 2022-09-28 NOTE — Telephone Encounter (Signed)
CPE is today.

## 2022-09-28 NOTE — Assessment & Plan Note (Addendum)
BP adequately controlled. Continue amlodipine 5 mg daily and low salt diet. BP check monthly at her pulmonologist office before receiving Nucala injection.

## 2022-09-28 NOTE — Assessment & Plan Note (Signed)
Continue gabapentin 300 mg daily at bedtime. As far as liver function test is not back to normal, recommend avoiding NSAIDs. She can take acetaminophen 500 mg up to 4 tablets daily. She follows with orthopedics.

## 2022-09-28 NOTE — Assessment & Plan Note (Addendum)
Liver US done in 08/2022 show cholelithiasis, which could explain problem.  Consultation with general surgeon pending to discuss cholecystectomy. Recommend to limit alcohol intake

## 2022-09-28 NOTE — Assessment & Plan Note (Signed)
We discussed the importance of regular physical activity and healthy diet for prevention of chronic illness and/or complications. Preventive guidelines reviewed. Vaccination up to date. Ca++ and vit D supplementation to continue. Next CPE in a year. 

## 2022-09-28 NOTE — Patient Instructions (Addendum)
A few things to remember from today's visit:  Routine general medical examination at a health care facility  Hyperlipidemia, unspecified hyperlipidemia type - Plan: Lipid panel  Hypertension, essential, benign - Plan: Basic metabolic panel  Major depressive disorder in partial remission, unspecified whether recurrent ([place]), Chronic  Abnormal LFTs (liver function tests) - Plan: Hepatic function panel, Ambulatory referral to General Surgery  Calculus of gallbladder without cholecystitis without obstruction - Plan: Ambulatory referral to General Surgery  Vitamin D deficiency, unspecified - Plan: VITAMIN D 25 Hydroxy (Vit-D Deficiency, Fractures) Preventive Care 65 Years and Older, Female Preventive care refers to lifestyle choices and visits with your health care provider that can promote health and wellness. Preventive care visits are also called wellness exams. What can I expect for my preventive care visit? Counseling Your health care provider may ask you questions about your: Medical history, including: Past medical problems. Family medical history. Pregnancy and menstrual history. History of falls. Current health, including: Memory and ability to understand (cognition). Emotional well-being. Home life and relationship well-being. Sexual activity and sexual health. Lifestyle, including: Alcohol, nicotine or tobacco, and drug use. Access to firearms. Diet, exercise, and sleep habits. Work and work Statistician. Sunscreen use. Safety issues such as seatbelt and bike helmet use. Physical exam Your health care provider will check your: Height and weight. These may be used to calculate your BMI (body mass index). BMI is a measurement that tells if you are at a healthy weight. Waist circumference. This measures the distance around your waistline. This measurement also tells if you are at a healthy weight and may help predict your risk of certain diseases, such as type 2 diabetes and  high blood pressure. Heart rate and blood pressure. Body temperature. Skin for abnormal spots. What immunizations do I need?  Vaccines are usually given at various ages, according to a schedule. Your health care provider will recommend vaccines for you based on your age, medical history, and lifestyle or other factors, such as travel or where you work. What tests do I need? Screening Your health care provider may recommend screening tests for certain conditions. This may include: Lipid and cholesterol levels. Hepatitis C test. Hepatitis B test. HIV (human immunodeficiency virus) test. STI (sexually transmitted infection) testing, if you are at risk. Lung cancer screening. Colorectal cancer screening. Diabetes screening. This is done by checking your blood sugar (glucose) after you have not eaten for a while (fasting). Mammogram. Talk with your health care provider about how often you should have regular mammograms. BRCA-related cancer screening. This may be done if you have a family history of breast, ovarian, tubal, or peritoneal cancers. Bone density scan. This is done to screen for osteoporosis. Talk with your health care provider about your test results, treatment options, and if necessary, the need for more tests. Follow these instructions at home: Eating and drinking  Eat a diet that includes fresh fruits and vegetables, whole grains, lean protein, and low-fat dairy products. Limit your intake of foods with high amounts of sugar, saturated fats, and salt. Take vitamin and mineral supplements as recommended by your health care provider. Do not drink alcohol if your health care provider tells you not to drink. If you drink alcohol: Limit how much you have to 0-1 drink a day. Know how much alcohol is in your drink. In the [place] one drink equals one 12 oz bottle of beer (355 mL), one 5 oz glass of wine (148 mL), or one 1 oz glass of hard liquor (  44 mL). Lifestyle Brush your teeth  every morning and night with fluoride toothpaste. Floss one time each day. Exercise for at least 30 minutes 5 or more days each week. Do not use any products that contain nicotine or tobacco. These products include cigarettes, chewing tobacco, and vaping devices, such as e-cigarettes. If you need help quitting, ask your health care provider. Do not use drugs. If you are sexually active, practice safe sex. Use a condom or other form of protection in order to prevent STIs. Take aspirin only as told by your health care provider. Make sure that you understand how much to take and what form to take. Work with your health care provider to find out whether it is safe and beneficial for you to take aspirin daily. Ask your health care provider if you need to take a cholesterol-lowering medicine (statin). Find healthy ways to manage stress, such as: Meditation, yoga, or listening to music. Journaling. Talking to a trusted person. Spending time with friends and family. Minimize exposure to UV radiation to reduce your risk of skin cancer. Safety Always wear your seat belt while driving or riding in a vehicle. Do not drive: If you have been drinking alcohol. Do not ride with someone who has been drinking. When you are tired or distracted. While texting. If you have been using any mind-altering substances or drugs. Wear a helmet and other protective equipment during sports activities. If you have firearms in your house, make sure you follow all gun safety procedures. What's next? Visit your health care provider once a year for an annual wellness visit. Ask your health care provider how often you should have your eyes and teeth checked. Stay up to date on all vaccines. This information is not intended to replace advice given to you by your health care provider. Make sure you discuss any questions you have with your health care provider. Document Revised: 03/01/2021 Document Reviewed: 03/01/2021 Elsevier  Patient Education  [place].  If you need refills for medications you take chronically, please call your pharmacy. Do not use My Chart to request refills or for acute issues that need immediate attention. If you send a my chart message, it may take a few days to be addressed, specially if I am not in the office.  Please be sure medication list is accurate. If a new problem present, please set up appointment sooner than planned today.

## 2022-10-01 ENCOUNTER — Encounter: Payer: Self-pay | Admitting: *Deleted

## 2022-10-01 ENCOUNTER — Telehealth: Payer: Self-pay | Admitting: *Deleted

## 2022-10-01 DIAGNOSIS — S29012A Strain of muscle and tendon of back wall of thorax, initial encounter: Secondary | ICD-10-CM | POA: Diagnosis not present

## 2022-10-01 DIAGNOSIS — M5136 Other intervertebral disc degeneration, lumbar region: Secondary | ICD-10-CM | POA: Diagnosis not present

## 2022-10-01 DIAGNOSIS — M5032 Other cervical disc degeneration, mid-cervical region, unspecified level: Secondary | ICD-10-CM | POA: Diagnosis not present

## 2022-10-01 DIAGNOSIS — M5137 Other intervertebral disc degeneration, lumbosacral region: Secondary | ICD-10-CM | POA: Diagnosis not present

## 2022-10-01 DIAGNOSIS — M9905 Segmental and somatic dysfunction of pelvic region: Secondary | ICD-10-CM | POA: Diagnosis not present

## 2022-10-01 DIAGNOSIS — M9902 Segmental and somatic dysfunction of thoracic region: Secondary | ICD-10-CM | POA: Diagnosis not present

## 2022-10-01 DIAGNOSIS — M9903 Segmental and somatic dysfunction of lumbar region: Secondary | ICD-10-CM | POA: Diagnosis not present

## 2022-10-01 DIAGNOSIS — M9901 Segmental and somatic dysfunction of cervical region: Secondary | ICD-10-CM | POA: Diagnosis not present

## 2022-10-01 NOTE — Patient Outreach (Signed)
  Care Coordination   Initial Visit Note   10/01/2022 Name: Tamie Minteer MRN: [id number] DOB: [birth year]  Venice Marcucci Lepp is a 72 y.o. year old female who sees [place], Malka So, MD for primary care. I spoke with  Shauna Hugh by phone today.  What matters to the patients health and wellness today?  No needs    Goals Addressed             This Visit's Progress    COMPLETED: care coordination activity       Care Coordination Interventions: Reviewed medications with patient and discussed adherence with no needed refills Reviewed scheduled/upcoming provider appointments including sufficient transportation source Assessed social determinant of health barriers Educated on care management services with no needs presented at this time.          SDOH assessments and interventions completed:  Yes  SDOH Interventions Today    Flowsheet Row Most Recent Value  SDOH Interventions   Food Insecurity Interventions Intervention Not Indicated  Housing Interventions Intervention Not Indicated  Transportation Interventions Intervention Not Indicated  Utilities Interventions Intervention Not Indicated        Care Coordination Interventions:  Yes, provided   Follow up plan: No further intervention required.   Encounter Outcome:  Pt. Visit Completed   Raina Mina, RN Care Management Coordinator [place] Office [phone]

## 2022-10-01 NOTE — Patient Instructions (Signed)
Visit Information  Thank you for taking time to visit with me today. Please don't hesitate to contact me if I can be of assistance to you.   Following are the goals we discussed today:   Goals Addressed             This Visit's Progress    COMPLETED: care coordination activity       Care Coordination Interventions: Reviewed medications with patient and discussed adherence with no needed refills Reviewed scheduled/upcoming provider appointments including sufficient transportation source Assessed social determinant of health barriers Educated on care management services with no needs presented at this time.        Please call the care guide team at [phone] if you need to cancel or reschedule your appointment.   If you are experiencing a Mental Health or Behavioral Health Crisis or need someone to talk to, please call the Suicide and Crisis Lifeline: 988  Patient verbalizes understanding of instructions and care plan provided today and agrees to view in MyChart. Active MyChart status and patient understanding of how to access instructions and care plan via MyChart confirmed with patient.     No further follow up required: No needs  Gracen Ringwald, RN Care Management Coordinator [facility] Main Office [phone]    

## 2022-10-04 ENCOUNTER — Other Ambulatory Visit: Payer: Self-pay | Admitting: Family Medicine

## 2022-10-04 ENCOUNTER — Encounter: Payer: Self-pay | Admitting: Family Medicine

## 2022-10-04 DIAGNOSIS — M25571 Pain in right ankle and joints of right foot: Secondary | ICD-10-CM

## 2022-10-09 ENCOUNTER — Telehealth: Payer: Self-pay | Admitting: Primary Care

## 2022-10-09 MED ORDER — MOMETASONE FURO-FORMOTEROL FUM 200-5 MCG/ACT IN AERO: 2.0000 | INHALATION_SPRAY | Freq: Two times a day (BID) | RESPIRATORY_TRACT | 3 refills | Status: DC

## 2022-10-09 NOTE — Telephone Encounter (Signed)
Called and spoke with pt to verify pharmacy to send Rx to and have sent Rx in for pt. Nothing further needed.

## 2022-10-10 ENCOUNTER — Other Ambulatory Visit: Payer: Self-pay | Admitting: Primary Care

## 2022-10-17 ENCOUNTER — Other Ambulatory Visit: Payer: Self-pay | Admitting: Family Medicine

## 2022-10-17 DIAGNOSIS — I10 Essential (primary) hypertension: Secondary | ICD-10-CM

## 2022-10-18 ENCOUNTER — Ambulatory Visit (INDEPENDENT_AMBULATORY_CARE_PROVIDER_SITE_OTHER): Payer: Medicare Other

## 2022-10-18 VITALS — BP 146/86 | HR 54 | Temp 98.1°F | Resp 16 | Ht 62.0 in | Wt 203.8 lb

## 2022-10-18 DIAGNOSIS — J4540 Moderate persistent asthma, uncomplicated: Secondary | ICD-10-CM

## 2022-10-18 DIAGNOSIS — K8020 Calculus of gallbladder without cholecystitis without obstruction: Secondary | ICD-10-CM | POA: Diagnosis not present

## 2022-10-18 MED ORDER — MEPOLIZUMAB 100 MG ~~LOC~~ SOLR
100.0000 mg | Freq: Once | SUBCUTANEOUS | Status: AC
  Administered 2022-10-18: 100 mg via SUBCUTANEOUS
  Filled 2022-10-18: qty 1

## 2022-10-18 NOTE — Progress Notes (Signed)
Diagnosis: Asthma  Provider:  Marshell Garfinkel MD  Procedure: Injection  Nucala (Mepolizumab), Dose: 100 mg, Site: subcutaneous, Number of injections: 1  Post Care: Patient Discharged  Discharge: Condition: Good, Destination: Home . AVS provided to patient.   Performed by:  Paul Dykes, RN

## 2022-10-24 ENCOUNTER — Telehealth: Payer: Self-pay | Admitting: Pharmacy Technician

## 2022-10-24 NOTE — Telephone Encounter (Signed)
Auth Submission: NO AUTH NEEDED Payer: [employer] MEDICARE Medication & CPT/J Code(s) submitted: Nucala (Mepolizumab) [id number] Route of submission (phone, fax, portal): PORTAL Phone # Fax # Auth type: Buy/Bill Units/visits requested: Reference number: [id number] Approval from: 10/24/22 to 09/17/23

## 2022-11-12 ENCOUNTER — Telehealth: Payer: Self-pay | Admitting: Family Medicine

## 2022-11-12 NOTE — Telephone Encounter (Signed)
[facility] to schedule their annual wellness visit. Appointment made for 11/22/22.  [facility] direct phone # [phone]

## 2022-11-16 ENCOUNTER — Ambulatory Visit: Payer: Medicare Other

## 2022-11-20 ENCOUNTER — Ambulatory Visit (INDEPENDENT_AMBULATORY_CARE_PROVIDER_SITE_OTHER): Payer: Medicare Other | Admitting: *Deleted

## 2022-11-20 VITALS — BP 128/81 | HR 47 | Temp 98.1°F | Resp 16 | Ht 62.0 in | Wt 202.6 lb

## 2022-11-20 DIAGNOSIS — J4540 Moderate persistent asthma, uncomplicated: Secondary | ICD-10-CM | POA: Diagnosis not present

## 2022-11-20 MED ORDER — MEPOLIZUMAB 100 MG ~~LOC~~ SOLR
100.0000 mg | Freq: Once | SUBCUTANEOUS | Status: AC
  Administered 2022-11-20: 100 mg via SUBCUTANEOUS
  Filled 2022-11-20: qty 1

## 2022-11-20 NOTE — Progress Notes (Signed)
Diagnosis: Asthma  Provider:  Marshell Garfinkel MD  Procedure: Injection  Nucala (Mepolizumab), Dose: 100 mg, Site: subcutaneous, Number of injections: 1  Post Care: Observation period completed  Discharge: Condition: Good, Destination: Home . AVS Provided and AVS Declined  Performed by:  Oren Beckmann, RN

## 2022-11-22 ENCOUNTER — Ambulatory Visit (INDEPENDENT_AMBULATORY_CARE_PROVIDER_SITE_OTHER): Payer: Medicare Other | Admitting: Family Medicine

## 2022-11-22 ENCOUNTER — Other Ambulatory Visit: Payer: Self-pay | Admitting: Family Medicine

## 2022-11-22 DIAGNOSIS — Z0000 Encounter for general adult medical examination without abnormal findings: Secondary | ICD-10-CM

## 2022-11-22 DIAGNOSIS — G8929 Other chronic pain: Secondary | ICD-10-CM

## 2022-11-22 NOTE — Patient Instructions (Addendum)
I really enjoyed getting to talk with you today! I am available on Tuesdays and Thursdays for virtual visits if you have any questions or concerns, or if I can be of any further assistance.   CHECKLIST FROM ANNUAL WELLNESS VISIT:  -Follow up (please call to schedule if not scheduled after visit):    -please seek inperson care for evaluation of the arm tingling promptly as we discussed  -yearly for annual wellness visit with primary care office  Here is a list of your preventive care/health maintenance measures and the plan for each if any are due:  Health Maintenance  Topic Date Due   COVID-19 Vaccine (6 - 2023-24 season) 08/15/2022   MAMMOGRAM  12/20/2022   Medicare Annual Wellness ([place])  11/22/2023   COLONOSCOPY (Pts 45-19yr Insurance coverage will need to be confirmed)  01/03/2025   DTaP/Tdap/Td (2 - Td or Tdap) 09/30/2025   Pneumonia Vaccine 70 Years old  Completed   INFLUENZA VACCINE  Completed   DEXA SCAN  Completed   Hepatitis C Screening  Completed   Zoster Vaccines- Shingrix  Completed   HPV VACCINES  Aged Out    -See a dentist at least yearly  -Get your eyes checked and then per your eye specialist's recommendations  -Other issues addressed today:   -I have included below further information regarding a healthy whole foods based diet, physical activity guidelines for adults, stress management and opportunities for social connections. I hope you find this information useful.   -----------------------------------------------------------------------------------------------------------------------------------------------------------------------------------------------------------------------------------------------------------  NUTRITION: -eat real food: lots of colorful vegetables (half the plate) and fruits -5-7 servings of vegetables and fruits per day (fresh or steamed is best), exp. 2 servings of vegetables with lunch and dinner and 2 servings of fruit per day.  Berries and greens such as kale and collards are great choices.  -consume on a regular basis: whole grains (make sure first ingredient on label contains the word "whole"), fresh fruits, fish, nuts, seeds, healthy oils (such as olive oil, avocado oil, grape seed oil) -may eat small amounts of dairy and lean meat on occasion, but avoid processed meats such as ham, bacon, lunch meat, etc. -drink water -try to avoid fast food and pre-packaged foods, processed meat -most experts advise limiting sodium to < '2300mg'$  per day, should limit further is any chronic conditions such as high blood pressure, heart disease, diabetes, etc. The American Heart Association advised that < '1500mg'$  is is ideal -try to avoid foods that contain any ingredients with names you do not recognize  -try to avoid sugar/sweets (except for the natural sugar that occurs in fresh fruit) -try to avoid sweet drinks -try to avoid white rice, white bread, pasta (unless whole grain), white or yellow potatoes  EXERCISE GUIDELINES FOR ADULTS: -if you wish to increase your physical activity, do so gradually and with the approval of your doctor and once arm has been evaluated -STOP and seek medical care immediately if you have any chest pain, chest discomfort or trouble breathing when starting or increasing exercise  -move and stretch your body, legs, feet and arms when sitting for long periods -Physical activity guidelines for optimal health in adults: -least 150 minutes per week of aerobic exercise (can talk, but not sing) once approved by your doctor, 20-30 minutes of sustained activity or two 10 minute episodes of sustained activity every day.  -resistance training at least 2 days per week if approved by your doctor -balance exercises 3+ days per week:   Stand somewhere where you  have something sturdy to hold onto if you lose balance.    1) lift up on toes, start with 5x per day and work up to 20x   2) stand and lift on leg straight out  to the side so that foot is a few inches of the floor, start with 5x each side and work up to 20x each side   3) stand on one foot, start with 5 seconds each side and work up to 20 seconds on each side  If you need ideas or help with getting more active:  -Silver sneakers https://tools.silversneakers.com  -Walk with a Doc: http://stephens-thompson.biz/  -try to include resistance (weight lifting/strength building) and balance exercises twice per week: or the following link for ideas: ChessContest.fr  UpdateClothing.com.cy  STRESS MANAGEMENT: -can try meditating, or just sitting quietly with deep breathing while intentionally relaxing all parts of your body for 5 minutes daily -if you need further help with stress, anxiety or depression please follow up with your primary doctor or contact the wonderful folks at [place]: [place]: -options in [place] if you wish to engage in more social and exercise related activities:  -Silver sneakers https://tools.silversneakers.com  -Walk with a Doc: http://stephens-thompson.biz/  -Check out the [place] 50+ section on the [place] [employer] (hiking clubs, book clubs, cards and games, chess, exercise classes, aquatic classes and much more) - see the website for details: https://www.[place].gov/departments/parks-recreation/active-adults50  -YouTube has lots of exercise videos for different ages and abilities as well  -[place] (a variety of indoor and outdoor inperson activities for adults). [phone]. [address]  -Virtual Online Classes (a variety of topics): see seniorplanet.org or call [phone]  -consider volunteering at a school, hospice center, church, senior center or elsewhere   ADVANCED HEALTHCARE DIRECTIVES:  Everyone should have advanced  health care directives in place. This is so that you get the care you want, should you ever be in a situation where you are unable to make your own medical decisions.   From the [place] Advanced Directive Website: "[place] are legal documents in which you give written instructions about your health care if, in the future, you cannot speak for yourself.   A health care power of attorney allows you to name a person you trust to make your health care decisions if you cannot make them yourself. A declaration of a desire for a natural death (or living will) is document, which states that you desire not to have your life prolonged by extraordinary measures if you have a terminal or incurable illness or if you are in a vegetative state. An advance instruction for mental health treatment makes a declaration of instructions, information and preferences regarding your mental health treatment. It also states that you are aware that the advance instruction authorizes a mental health treatment provider to act according to your wishes. It may also outline your consent or refusal of mental health treatment. A declaration of an anatomical gift allows anyone over the age of 31 to make a gift by will, organ donor card or other document."   Please see the following website or an elder law attorney for forms, FAQs and for completion of advanced directives: [place] [employer] [place] ([email or web address])  Or copy and paste the following to your web browser: [email or web address].cy

## 2022-11-22 NOTE — Progress Notes (Signed)
PATIENT CHECK-IN and HEALTH RISK ASSESSMENT QUESTIONNAIRE:  -completed by phone/video for upcoming Medicare Preventive Visit  Pre-Visit Check-in: 1)Vitals (height, wt, BP, etc) - record in vitals section for visit on day of visit 2)Review and Update Medications, Allergies PMH, Surgeries, Social history in Epic 3)Hospitalizations in the last year with date/reason? No  4)Review and Update Care Team (patient's specialists) in Epic 5) Complete PHQ9 in Epic  6) Complete Fall Screening in Epic 7)Review all Health Maintenance Due and order under PCP if not done.  8)Medicare Wellness Questionnaire: Answer theses question about your habits: Do you drink alcohol? Yes  If yes, how many drinks do you have a day?Occasionally  Have you ever smoked?No  Quit date if applicable?  n/a How many packs a day do/did you smoke? n/a Do you use smokeless tobacco?n/a Do you use an illicit drugs?no Do you exercises? Plans to walk every other day - has started, walked today and has signed up at a wellness center to start exercise Are you sexually active? No Number of partners? N/A Typical breakfast: oatmeal, yogurt, toast, egg Typical lunch: sandwich, salad Typical dinner: beef, chicken, potatoes, soup,  Typical snacks:fruit, chips, nuts  Beverages: coffee, water, diet ginger ale  Answer theses question about you: Can you perform most household chores? Yes Do you find it hard to follow a conversation in a noisy room?No Do you often ask people to speak up or repeat themselves?Yes, has seen audiologist  Do you feel that you have a problem with memory?No Do you balance your checkbook and or bank acounts?Yes, check online banking make sure no overdrafts Do you feel safe at home?Yes Last dentist visit?Nov 2023 Do you need assistance with any of the following: Please note if so   Driving?-No night driving  Feeding yourself?-no   Getting from bed to chair?-no  Getting to the [place] or showering?no    Dressing yourself?-no  Managing money?-no  Climbing a flight of stairs-no   Preparing meals?-no  Do you have Advanced Directives in place (Living Will, Healthcare Power or Attorney)? No, plans to do this   Last eye Exam and location?Jan 2024-[facility]   Do you currently use prescribed or non-prescribed narcotic or opioid pain medications?No   Do you have a history or close family history of breast, ovarian, tubal or peritoneal cancer or a family member with BRCA (breast cancer susceptibility 1 and 2) gene mutations?  Nurse/Assistant Credentials/time stamp: St   ----------------------------------------------------------------------------------------------------------------------------------------------------------------------------------------------------------------------   MEDICARE ANNUAL PREVENTIVE VISIT WITH PROVIDER: (Welcome to [employer], initial annual wellness or annual wellness exam)  Virtual Visit via Phone Note  I connected with Ana Hughes on 11/22/22 by phone and verified that I am speaking with the correct person using two identifiers.  Location patient: home Location provider:work or home office Persons participating in the virtual visit: patient, provider  Concerns and/or follow up today: reports everything is pretty stable except has had some tingling in the L should down outer arm and forearm down to the wrist intermittently this past week. Can't recall any injury. Reports a hx of back/neck issues but none recently - saw chiroprator in the past and has tens unit. Denies pain, weakness, numbness, CP, SOB, or any neurological issues elsewhere.    See HM section in Epic for other details of completed HM.    ROS: negative for report of fevers, unintentional weight loss, vision changes, vision loss, hearing loss or change, chest pain, sob, hemoptysis, melena,  hematochezia, hematuria, falls, bleeding or bruising, loc, thoughts of  suicide or self harm, memory loss  Patient-completed extensive health risk assessment - reviewed and discussed with the patient: See Health Risk Assessment completed with patient prior to the visit either above or in recent phone note. This was reviewed in detailed with the patient today and appropriate recommendations, orders and referrals were placed as needed per Summary below and patient instructions.   Review of Medical History: -PMH, PSH, Family History and current specialty and care providers reviewed and updated and listed below   Patient Care Team: [place], Betty G, MD as PCP - General (Family Medicine)   Past Medical History:  Diagnosis Date   Arthritis    Asthma    Basal cell carcinoma    Nose, forehead & left shoulder   Depression    Glaucoma    History of colon polyps    Hypertension     Past Surgical History:  Procedure Laterality Date   [place], 1977, 1981   MOHS SURGERY     REPLACEMENT TOTAL KNEE BILATERAL Bilateral    left knee, 2005, right knee 2004   TOTAL HIP ARTHROPLASTY Bilateral 012, right hip, 2013 left hip    Social History   Socioeconomic History   Marital status: Widowed    Spouse name: Not on file   Number of children: 3   Years of education: Not on file   Highest education level: Not on file  Occupational History   Occupation: Retired  Tobacco Use   Smoking status: Never   Smokeless tobacco: Never  Vaping Use   Vaping Use: Never used  Substance and Sexual Activity   Alcohol use: Yes    Comment: socially   Drug use: Never   Sexual activity: Not Currently  Other Topics Concern   Not on file  Social History Narrative   Not on file   Social Determinants of Health   Financial Resource Strain: Low Risk  (11/08/2021)   Overall Financial Resource Strain (CARDIA)    Difficulty of Paying Living Expenses: Not hard at all  Food Insecurity: No Food Insecurity (10/01/2022)   Hunger Vital Sign    Worried About Running Out of  Food in the Last Year: Never true    [place] in the Last Year: Never true  Transportation Needs: No Transportation Needs (10/01/2022)   PRAPARE - Hydrologist (Medical): No    Lack of Transportation (Non-Medical): No  Physical Activity: Insufficiently Active (11/08/2021)   Exercise Vital Sign    Days of Exercise per Week: 2 days    Minutes of Exercise per Session: 10 min  Stress: No Stress Concern Present (11/08/2021)   [place]    Feeling of Stress : Not at all  Social Connections: Socially Isolated (11/08/2021)   Social Connection and Isolation Panel [NHANES]    Frequency of Communication with Friends and Family: More than three times a week    Frequency of Social Gatherings with Friends and Family: More than three times a week    Attends Religious Services: Never    Marine scientist or Organizations: No    Attends Archivist Meetings: Never    Marital Status: Widowed  Intimate Partner Violence: Unknown (11/08/2021)   Humiliation, Afraid, Rape, and Kick questionnaire    Fear of Current or Ex-Partner: No    Emotionally Abused: No    Physically  Abused: No    Sexually Abused: Not on file    Family History  Problem Relation Age of Onset   Lung cancer Mother    Hypertension Mother    Brain cancer Mother    Prostate cancer Father    Breast cancer Sister 43   Depression Daughter    Drug abuse Daughter    Depression Daughter    Drug abuse Daughter    Heart attack Daughter    Depression Daughter    Drug abuse Daughter    Mental illness Daughter    Colon cancer Neg Hx    Stomach cancer Neg Hx    Esophageal cancer Neg Hx    Colon polyps Neg Hx    Pancreatic cancer Neg Hx    Liver disease Neg Hx     Current Outpatient Medications on File Prior to Visit  Medication Sig Dispense Refill   Aflibercept (EYLEA) 2 MG/0.05ML SOSY by Intravitreal route. Given by eye  doctor.     albuterol (PROVENTIL) (2.5 MG/3ML) 0.083% nebulizer solution Take 3 mLs (2.5 mg total) by nebulization every 6 (six) hours as needed for wheezing or shortness of breath. 75 mL 1   amLODipine (NORVASC) 5 MG tablet TAKE 1 TABLET (5 MG TOTAL) BY MOUTH DAILY. 90 tablet 3   bimatoprost (LUMIGAN) 0.01 % SOLN Place 1 drop into both eyes at bedtime.     dorzolamide (TRUSOPT) 2 % ophthalmic solution Place 1 drop into both eyes 3 (three) times daily.     DULERA 200-5 MCG/ACT AERO INHALE 2 PUFFS INTO THE LUNGS IN THE MORNING AND AT BEDTIME. 13 each 3   FLUoxetine (PROZAC) 40 MG capsule TAKE 1 CAPSULE BY MOUTH EVERY DAY 90 capsule 2   gabapentin (NEURONTIN) 300 MG capsule TAKE 1 CAPSULE BY MOUTH EVERY DAY AS NEEDED 30 capsule 2   LORazepam (ATIVAN) 0.5 MG tablet Take 0.5 mg by mouth daily as needed.     meloxicam (MOBIC) 15 MG tablet TAKE ONE TABLET BY MOUTH DAILY AS NEEDED. 30 tablet 1   montelukast (SINGULAIR) 10 MG tablet Take 1 tablet (10 mg total) by mouth at bedtime. 90 tablet 1   nystatin (MYCOSTATIN) 100000 UNIT/ML suspension Take 5 mLs (500,000 Units total) by mouth 3 (three) times daily. 473 mL 1   timolol (TIMOPTIC-XR) 0.25 % ophthalmic gel-forming Place 1 drop into the left eye daily.     No current facility-administered medications on file prior to visit.    Allergies  Allergen Reactions   Simbrinza [Brinzolamide-Brimonidine]        Physical Exam There were no vitals filed for this visit. Estimated body mass index is 37.06 kg/m as calculated from the following:   Height as of 11/20/22: '5\' 2"'$  (1.575 m).   Weight as of 11/20/22: 202 lb 9.6 oz (91.9 kg).  EKG (optional): deferred due to virtual visit  GENERAL: alert, oriented, no acute distress detected, full vision exam deferred due to pandemic and/or virtual encounter  PSYCH/NEURO: pleasant and cooperative, no obvious depression or anxiety, speech and thought processing grossly intact, Cognitive function grossly  intact  Flowsheet Row Office Visit from 11/22/2022 in [place] at [facility]  PHQ-9 Total Score 0           11/22/2022    4:25 PM 09/28/2022   10:16 AM 08/14/2022    2:21 PM 11/08/2021    2:11 PM 12/14/2020    1:42 PM  Depression screen PHQ 2/9  Decreased Interest 0 0 0 0  1  Down, Depressed, Hopeless 0 0 0 0 0  PHQ - 2 Score 0 0 0 0 1  Altered sleeping 0 1 0  0  Tired, decreased energy 0 1 1  0  Change in appetite 0 1 0  0  Feeling bad or failure about yourself  0 0 0  0  Trouble concentrating 0 1 0  0  Moving slowly or fidgety/restless 0 0 0  0  Suicidal thoughts 0 0 0  0  PHQ-9 Score 0 '4 1  1  '$ Difficult doing work/chores Not difficult at all Not difficult at all Somewhat difficult  Not difficult at all       11/08/2021    2:16 PM 08/19/2022    5:47 PM 09/28/2022    9:46 AM 11/22/2022    6:22 AM 11/22/2022    4:23 PM  Fall Risk  Falls in the past year? 0  0 1 1  Was there an injury with Fall? 0  0 0 0  Fall Risk Category Calculator 0  0 1 1  Fall Risk Category (Retired) Low  Low    (RETIRED) Patient Fall Risk Level Low fall risk Low fall risk Low fall risk    Patient at Risk for Falls Due to No Fall Risks  Other (Comment)    Fall risk Follow up   Falls evaluation completed    Was leaning back over a table to take a photo and the table broke and she fell.    SUMMARY AND PLAN:  Encounter for Medicare annual wellness exam   Discussed applicable health maintenance/preventive health measures and advised and referred or ordered per patient preferences:  Health Maintenance  Topic Date Due   COVID-19 Vaccine (6 - 2023-24 season) Due yearly in the fall, utd   MAMMOGRAM  12/20/2022   Medicare Annual Wellness (AWV)  11/22/2023   COLONOSCOPY (Pts 45-3yr Insurance coverage will need to be confirmed)  01/03/2025   DTaP/Tdap/Td (2 - Td or Tdap) 09/30/2025   Pneumonia Vaccine 70 Years old  Completed   INFLUENZA VACCINE  Completed   DEXA SCAN  Completed    Hepatitis C Screening  Completed   Zoster Vaccines- Shingrix  Completed   HPV VACCINES  Aged Out    Education and counseling on the following was provided based on the above review of health and a plan/checklist for the patient, along with additional information discussed, was provided for the patient in the patient instructions :  -advised inperson evaluation for the paresthesias can discussed common and serious potential causes for the symptoms and offered to reach out to PCP office for appt and also discussed UCC and ortho ucc options. She reports she wishes to see how she does and if not improving by tomorrow agrees she will seek care.  -Advised on importance of and resources for completing advanced directives - she decline assistance with this as reports knows how to do and plans to do -Advised and counseled on maintaining healthy weight and healthy lifestyle - including the importance of a healthy diet, regular physical activity, social connections and stress management. -Advised and counseled on a whole foods based healthy diet and regular exercise: A summary of a healthy diet was provided in the Patient Instructions. Recommended regular exercise and discussed exercise guidelines and options for at home and within the community once arm issues address.  -Advise yearly dental visits at minimum and regular eye exams  Follow up: see patient instructions  Patient Instructions  I really enjoyed getting to talk with you today! I am available on Tuesdays and Thursdays for virtual visits if you have any questions or concerns, or if I can be of any further assistance.   CHECKLIST FROM ANNUAL WELLNESS VISIT:  -Follow up (please call to schedule if not scheduled after visit):    -please seek inperson care for evaluation of the arm tingling promptly as we discussed  -yearly for annual wellness visit with primary care office  Here is a list of your preventive care/health maintenance measures  and the plan for each if any are due:  Health Maintenance  Topic Date Due   COVID-19 Vaccine (6 - 2023-24 season) 08/15/2022   MAMMOGRAM  12/20/2022   Medicare Annual Wellness ([address])  11/22/2023   COLONOSCOPY (Pts 45-22yr Insurance coverage will need to be confirmed)  01/03/2025   DTaP/Tdap/Td (2 - Td or Tdap) 09/30/2025   Pneumonia Vaccine 70 Years old  Completed   INFLUENZA VACCINE  Completed   DEXA SCAN  Completed   Hepatitis C Screening  Completed   Zoster Vaccines- Shingrix  Completed   HPV VACCINES  Aged Out    -See a dentist at least yearly  -Get your eyes checked and then per your eye specialist's recommendations  -Other issues addressed today:   -I have included below further information regarding a healthy whole foods based diet, physical activity guidelines for adults, stress management and opportunities for social connections. I hope you find this information useful.   -----------------------------------------------------------------------------------------------------------------------------------------------------------------------------------------------------------------------------------------------------------  NUTRITION: -eat real food: lots of colorful vegetables (half the plate) and fruits -5-7 servings of vegetables and fruits per day (fresh or steamed is best), exp. 2 servings of vegetables with lunch and dinner and 2 servings of fruit per day. Berries and greens such as kale and collards are great choices.  -consume on a regular basis: whole grains (make sure first ingredient on label contains the word "whole"), fresh fruits, fish, nuts, seeds, healthy oils (such as olive oil, avocado oil, grape seed oil) -may eat small amounts of dairy and lean meat on occasion, but avoid processed meats such as ham, bacon, lunch meat, etc. -drink water -try to avoid fast food and pre-packaged foods, processed meat -most experts advise limiting sodium to < '2300mg'$  per day,  should limit further is any chronic conditions such as high blood pressure, heart disease, diabetes, etc. The American Heart Association advised that < '1500mg'$  is is ideal -try to avoid foods that contain any ingredients with names you do not recognize  -try to avoid sugar/sweets (except for the natural sugar that occurs in fresh fruit) -try to avoid sweet drinks -try to avoid white rice, white bread, pasta (unless whole grain), white or yellow potatoes  EXERCISE GUIDELINES FOR ADULTS: -if you wish to increase your physical activity, do so gradually and with the approval of your doctor and once arm has been evaluated -STOP and seek medical care immediately if you have any chest pain, chest discomfort or trouble breathing when starting or increasing exercise  -move and stretch your body, legs, feet and arms when sitting for long periods -Physical activity guidelines for optimal health in adults: -least 150 minutes per week of aerobic exercise (can talk, but not sing) once approved by your doctor, 20-30 minutes of sustained activity or two 10 minute episodes of sustained activity every day.  -resistance training at least 2 days per week if approved by your doctor -balance exercises 3+ days per week:  Stand somewhere where you have something sturdy to hold onto if you lose balance.    1) lift up on toes, start with 5x per day and work up to 20x   2) stand and lift on leg straight out to the side so that foot is a few inches of the floor, start with 5x each side and work up to 20x each side   3) stand on one foot, start with 5 seconds each side and work up to 20 seconds on each side  If you need ideas or help with getting more active:  -Silver sneakers https://tools.silversneakers.com  -Walk with a Doc: http://stephens-thompson.biz/  -try to include resistance (weight lifting/strength building) and balance exercises twice per week: or the following link for  ideas: ChessContest.fr  UpdateClothing.com.cy  STRESS MANAGEMENT: -can try meditating, or just sitting quietly with deep breathing while intentionally relaxing all parts of your body for 5 minutes daily -if you need further help with stress, anxiety or depression please follow up with your primary doctor or contact the wonderful folks at [place]: [place]: -options in [place] if you wish to engage in more social and exercise related activities:  -Silver sneakers https://tools.silversneakers.com  -Walk with a Doc: http://stephens-thompson.biz/  -Check out the [place] 50+ section on the [place] [employer] (hiking clubs, book clubs, cards and games, chess, exercise classes, aquatic classes and much more) - see the website for details: https://www.[place].gov/departments/parks-recreation/active-adults50  -YouTube has lots of exercise videos for different ages and abilities as well  -[place] (a variety of indoor and outdoor inperson activities for adults). 301-521-1775. [address]  -Virtual Online Classes (a variety of topics): see seniorplanet.org or call [phone]  -consider volunteering at a school, hospice center, church, senior center or elsewhere   ADVANCED HEALTHCARE DIRECTIVES:  Everyone should have advanced health care directives in place. This is so that you get the care you want, should you ever be in a situation where you are unable to make your own medical decisions.   From the [place] Advanced Directive Website: "[place] are legal documents in which you give written instructions about your health care if, in the future, you cannot speak for yourself.   A health care power of attorney allows you to name a person you trust to make your health care decisions if you  cannot make them yourself. A declaration of a desire for a natural death (or living will) is document, which states that you desire not to have your life prolonged by extraordinary measures if you have a terminal or incurable illness or if you are in a vegetative state. An advance instruction for mental health treatment makes a declaration of instructions, information and preferences regarding your mental health treatment. It also states that you are aware that the advance instruction authorizes a mental health treatment provider to act according to your wishes. It may also outline your consent or refusal of mental health treatment. A declaration of an anatomical gift allows anyone over the age of 38 to make a gift by will, organ donor card or other document."   Please see the following website or an elder law attorney for forms, FAQs and for completion of advanced directives: [place] [employer] [place] ([email or web address])  Or copy and paste the following to your web browser: [email or web address].cy          Lucretia Kern, DO

## 2022-11-26 DIAGNOSIS — H34812 Central retinal vein occlusion, left eye, with macular edema: Secondary | ICD-10-CM | POA: Diagnosis not present

## 2022-11-26 DIAGNOSIS — H43813 Vitreous degeneration, bilateral: Secondary | ICD-10-CM | POA: Diagnosis not present

## 2022-11-26 DIAGNOSIS — H35371 Puckering of macula, right eye: Secondary | ICD-10-CM | POA: Diagnosis not present

## 2022-11-26 DIAGNOSIS — H35033 Hypertensive retinopathy, bilateral: Secondary | ICD-10-CM | POA: Diagnosis not present

## 2022-12-07 DIAGNOSIS — M9903 Segmental and somatic dysfunction of lumbar region: Secondary | ICD-10-CM | POA: Diagnosis not present

## 2022-12-07 DIAGNOSIS — M5032 Other cervical disc degeneration, mid-cervical region, unspecified level: Secondary | ICD-10-CM | POA: Diagnosis not present

## 2022-12-07 DIAGNOSIS — M5137 Other intervertebral disc degeneration, lumbosacral region: Secondary | ICD-10-CM | POA: Diagnosis not present

## 2022-12-07 DIAGNOSIS — M5136 Other intervertebral disc degeneration, lumbar region: Secondary | ICD-10-CM | POA: Diagnosis not present

## 2022-12-07 DIAGNOSIS — M9905 Segmental and somatic dysfunction of pelvic region: Secondary | ICD-10-CM | POA: Diagnosis not present

## 2022-12-07 DIAGNOSIS — M9901 Segmental and somatic dysfunction of cervical region: Secondary | ICD-10-CM | POA: Diagnosis not present

## 2022-12-07 DIAGNOSIS — M9902 Segmental and somatic dysfunction of thoracic region: Secondary | ICD-10-CM | POA: Diagnosis not present

## 2022-12-07 DIAGNOSIS — S29012A Strain of muscle and tendon of back wall of thorax, initial encounter: Secondary | ICD-10-CM | POA: Diagnosis not present

## 2022-12-18 ENCOUNTER — Other Ambulatory Visit: Payer: Self-pay | Admitting: Family Medicine

## 2022-12-18 ENCOUNTER — Ambulatory Visit (INDEPENDENT_AMBULATORY_CARE_PROVIDER_SITE_OTHER): Payer: Medicare Other

## 2022-12-18 VITALS — BP 150/85 | HR 52 | Temp 98.0°F | Resp 18 | Ht 62.0 in | Wt 206.2 lb

## 2022-12-18 DIAGNOSIS — J4540 Moderate persistent asthma, uncomplicated: Secondary | ICD-10-CM | POA: Diagnosis not present

## 2022-12-18 DIAGNOSIS — M25571 Pain in right ankle and joints of right foot: Secondary | ICD-10-CM

## 2022-12-18 MED ORDER — MEPOLIZUMAB 100 MG ~~LOC~~ SOLR
100.0000 mg | Freq: Once | SUBCUTANEOUS | Status: AC
  Administered 2022-12-18: 100 mg via SUBCUTANEOUS
  Filled 2022-12-18: qty 1

## 2022-12-18 NOTE — Progress Notes (Signed)
Diagnosis: Asthma  Provider:  Marshell Garfinkel MD  Procedure: Injection  Nucala (Mepolizumab), Dose: 100 mg, Site: subcutaneous, Number of injections: 1  Post Care:  n/a  Discharge: Condition: Good, Destination: Home . AVS Provided  Performed by:  Adelina Mings, LPN

## 2022-12-27 NOTE — Progress Notes (Unsigned)
ACUTE VISIT Chief Complaint  Patient presents with   Tingling    Down left arm and into fingers x a month, left arm, happens daily.    HPI: Ms.Seara Waldemar DickensFay Hinsley is a 72 y.o. female, who is here today complaining of *** HPI No results found for: "VITAMINB12"  Review of Systems See other pertinent positives and negatives in HPI.  Current Outpatient Medications on File Prior to Visit  Medication Sig Dispense Refill   Aflibercept (EYLEA) 2 MG/0.05ML SOSY by Intravitreal route. Given by eye doctor.     albuterol (PROVENTIL) (2.5 MG/3ML) 0.083% nebulizer solution Take 3 mLs (2.5 mg total) by nebulization every 6 (six) hours as needed for wheezing or shortness of breath. 75 mL 1   amLODipine (NORVASC) 5 MG tablet TAKE 1 TABLET (5 MG TOTAL) BY MOUTH DAILY. 90 tablet 3   bimatoprost (LUMIGAN) 0.01 % SOLN Place 1 drop into both eyes at bedtime.     dorzolamide (TRUSOPT) 2 % ophthalmic solution Place 1 drop into both eyes 3 (three) times daily.     DULERA 200-5 MCG/ACT AERO INHALE 2 PUFFS INTO THE LUNGS IN THE MORNING AND AT BEDTIME. 13 each 3   FLUoxetine (PROZAC) 40 MG capsule TAKE 1 CAPSULE BY MOUTH EVERY DAY 90 capsule 2   gabapentin (NEURONTIN) 300 MG capsule TAKE 1 CAPSULE BY MOUTH EVERY DAY AS NEEDED 30 capsule 5   LORazepam (ATIVAN) 0.5 MG tablet Take 0.5 mg by mouth daily as needed.     meloxicam (MOBIC) 15 MG tablet TAKE 1 TABLET BY MOUTH EVERY DAY AS NEEDED 30 tablet 1   montelukast (SINGULAIR) 10 MG tablet Take 1 tablet (10 mg total) by mouth at bedtime. 90 tablet 1   nystatin (MYCOSTATIN) 100000 UNIT/ML suspension Take 5 mLs (500,000 Units total) by mouth 3 (three) times daily. 473 mL 1   timolol (TIMOPTIC-XR) 0.25 % ophthalmic gel-forming Place 1 drop into the left eye daily.     No current facility-administered medications on file prior to visit.   Past Medical History:  Diagnosis Date   Arthritis    Asthma    Basal cell carcinoma    Nose, forehead & left shoulder    Depression    Glaucoma    History of colon polyps    Hypertension    Allergies  Allergen Reactions   Simbrinza [Brinzolamide-Brimonidine]    Social History   Socioeconomic History   Marital status: Widowed    Spouse name: Not on file   Number of children: 3   Years of education: Not on file   Highest education level: 12th grade  Occupational History   Occupation: Retired  Tobacco Use   Smoking status: Never   Smokeless tobacco: Never  Vaping Use   Vaping Use: Never used  Substance and Sexual Activity   Alcohol use: Yes    Comment: socially   Drug use: Never   Sexual activity: Not Currently  Other Topics Concern   Not on file  Social History Narrative   Not on file   Social Determinants of Health   Financial Resource Strain: Low Risk  (12/27/2022)   Overall Financial Resource Strain (CARDIA)    Difficulty of Paying Living Expenses: Not very hard  Food Insecurity: No Food Insecurity (12/27/2022)   Hunger Vital Sign    Worried About Running Out of Food in the Last Year: Never true    Ran Out of Food in the Last Year: Never true  Transportation Needs: No Transportation  Needs (12/27/2022)   PRAPARE - Administrator, Civil Service (Medical): No    Lack of Transportation (Non-Medical): No  Physical Activity: Insufficiently Active (12/27/2022)   Exercise Vital Sign    Days of Exercise per Week: 4 days    Minutes of Exercise per Session: 20 min  Stress: No Stress Concern Present (12/27/2022)   [employer] of Occupational Health - Occupational Stress Questionnaire    Feeling of Stress : Not at all  Social Connections: Moderately Isolated (12/27/2022)   Social Connection and Isolation Panel [NHANES]    Frequency of Communication with Friends and Family: More than three times a week    Frequency of Social Gatherings with Friends and Family: More than three times a week    Attends Religious Services: 1 to 4 times per year    Active Member of [employer] or  Organizations: No    Attends Banker Meetings: Not on file    Marital Status: Widowed    Vitals:   12/28/22 1049  BP: 128/80  Pulse: 60  Resp: 16  Temp: 98.8 F (37.1 C)  SpO2: 98%   Body mass index is 37.4 kg/m.  Physical Exam Vitals and nursing note reviewed.  Constitutional:      General: She is not in acute distress.    Appearance: She is well-developed.  HENT:     Head: Normocephalic and atraumatic.     Mouth/Throat:     Mouth: Mucous membranes are moist.     Pharynx: Oropharynx is clear.  Eyes:     Conjunctiva/sclera: Conjunctivae normal.  Cardiovascular:     Rate and Rhythm: Normal rate and regular rhythm.     Pulses:          Dorsalis pedis pulses are 2+ on the right side and 2+ on the left side.     Heart sounds: No murmur heard. Pulmonary:     Effort: Pulmonary effort is normal. No respiratory distress.     Breath sounds: Normal breath sounds.  Abdominal:     Palpations: Abdomen is soft. There is no hepatomegaly or mass.     Tenderness: There is no abdominal tenderness.  Musculoskeletal:     Cervical back: No tenderness.     Comments: No significant limitations of neck ROM.  Lymphadenopathy:     Cervical: No cervical adenopathy.  Skin:    General: Skin is warm.     Findings: No erythema or rash.  Neurological:     General: No focal deficit present.     Mental Status: She is alert and oriented to person, place, and time.     Cranial Nerves: No cranial nerve deficit.     Motor: No tremor or pronator drift.     Coordination: Romberg sign negative.     Gait: Gait normal.     Deep Tendon Reflexes:     Reflex Scores:      Bicep reflexes are 2+ on the right side and 2+ on the left side.      Patellar reflexes are 2+ on the right side and 2+ on the left side. Psychiatric:     Comments: Well groomed, good eye contact.     ASSESSMENT AND PLAN: There are no diagnoses linked to this encounter.  No follow-ups on file.  Braylie Badami G. [place],  MD  [facility]. [place] office.  Discharge Instructions   None

## 2022-12-28 ENCOUNTER — Encounter: Payer: Self-pay | Admitting: Family Medicine

## 2022-12-28 ENCOUNTER — Ambulatory Visit (INDEPENDENT_AMBULATORY_CARE_PROVIDER_SITE_OTHER): Payer: Medicare Other | Admitting: Family Medicine

## 2022-12-28 VITALS — BP 128/80 | HR 60 | Temp 98.8°F | Resp 16 | Ht 62.0 in | Wt 204.5 lb

## 2022-12-28 DIAGNOSIS — R202 Paresthesia of skin: Secondary | ICD-10-CM

## 2022-12-28 DIAGNOSIS — M5412 Radiculopathy, cervical region: Secondary | ICD-10-CM

## 2022-12-28 DIAGNOSIS — R200 Anesthesia of skin: Secondary | ICD-10-CM | POA: Diagnosis not present

## 2022-12-28 DIAGNOSIS — R2689 Other abnormalities of gait and mobility: Secondary | ICD-10-CM | POA: Diagnosis not present

## 2022-12-28 LAB — VITAMIN B12: Vitamin B-12: 340 pg/mL (ref 211–911)

## 2022-12-28 LAB — TSH: TSH: 0.73 u[IU]/mL (ref 0.35–5.50)

## 2022-12-28 LAB — BASIC METABOLIC PANEL
BUN: 18 mg/dL (ref 6–23)
CO2: 29 mEq/L (ref 19–32)
Calcium: 9.5 mg/dL (ref 8.4–10.5)
Chloride: 105 mEq/L (ref 96–112)
Creatinine, Ser: 0.74 mg/dL (ref 0.40–1.20)
GFR: 82.15 mL/min (ref >60.00)
Glucose, Bld: 89 mg/dL (ref 70–99)
Potassium: 5.1 mEq/L (ref 3.5–5.1)
Sodium: 140 mEq/L (ref 135–145)

## 2022-12-28 LAB — CBC
HCT: 42.9 % (ref 36.0–46.0)
Hemoglobin: 14.5 g/dL (ref 12.0–15.0)
MCHC: 33.7 g/dL (ref 30.0–36.0)
MCV: 91.3 fl (ref 78.0–100.0)
Platelets: 304 10*3/uL (ref 150.0–400.0)
RBC: 4.7 Mil/uL (ref 3.87–5.11)
RDW: 12.9 % (ref 11.5–15.5)
WBC: 5.7 10*3/uL (ref 4.0–10.5)

## 2022-12-28 MED ORDER — PREDNISONE 20 MG PO TABS
ORAL_TABLET | ORAL | 0 refills | Status: AC
Start: 2022-12-28 — End: 2023-01-04

## 2022-12-28 NOTE — Patient Instructions (Signed)
A few things to remember from today's visit:  Numbness and tingling of left upper extremity - Plan: CBC, TSH, Vitamin B12, Basic metabolic panel, predniSONE (DELTASONE) 20 MG tablet  Cervical radiculopathy - Plan: predniSONE (DELTASONE) 20 MG tablet  Take Prednisone with breakfast. Fall precautions. If symptoms are persistent we may need neck imaging.  If you need refills for medications you take chronically, please call your pharmacy. Do not use My Chart to request refills or for acute issues that need immediate attention. If you send a my chart message, it may take a few days to be addressed, specially if I am not in the office.  Please be sure medication list is accurate. If a new problem present, please set up appointment sooner than planned today.

## 2022-12-31 DIAGNOSIS — H401122 Primary open-angle glaucoma, left eye, moderate stage: Secondary | ICD-10-CM | POA: Diagnosis not present

## 2022-12-31 DIAGNOSIS — H401111 Primary open-angle glaucoma, right eye, mild stage: Secondary | ICD-10-CM | POA: Diagnosis not present

## 2023-01-04 DIAGNOSIS — M5032 Other cervical disc degeneration, mid-cervical region, unspecified level: Secondary | ICD-10-CM | POA: Diagnosis not present

## 2023-01-04 DIAGNOSIS — M5137 Other intervertebral disc degeneration, lumbosacral region: Secondary | ICD-10-CM | POA: Diagnosis not present

## 2023-01-04 DIAGNOSIS — S29012A Strain of muscle and tendon of back wall of thorax, initial encounter: Secondary | ICD-10-CM | POA: Diagnosis not present

## 2023-01-04 DIAGNOSIS — M9901 Segmental and somatic dysfunction of cervical region: Secondary | ICD-10-CM | POA: Diagnosis not present

## 2023-01-04 DIAGNOSIS — M9902 Segmental and somatic dysfunction of thoracic region: Secondary | ICD-10-CM | POA: Diagnosis not present

## 2023-01-04 DIAGNOSIS — S39012A Strain of muscle, fascia and tendon of lower back, initial encounter: Secondary | ICD-10-CM | POA: Diagnosis not present

## 2023-01-04 DIAGNOSIS — M9903 Segmental and somatic dysfunction of lumbar region: Secondary | ICD-10-CM | POA: Diagnosis not present

## 2023-01-04 DIAGNOSIS — M9905 Segmental and somatic dysfunction of pelvic region: Secondary | ICD-10-CM | POA: Diagnosis not present

## 2023-01-07 ENCOUNTER — Telehealth: Payer: Self-pay | Admitting: Family Medicine

## 2023-01-07 DIAGNOSIS — M9901 Segmental and somatic dysfunction of cervical region: Secondary | ICD-10-CM | POA: Diagnosis not present

## 2023-01-07 DIAGNOSIS — M9905 Segmental and somatic dysfunction of pelvic region: Secondary | ICD-10-CM | POA: Diagnosis not present

## 2023-01-07 DIAGNOSIS — M9903 Segmental and somatic dysfunction of lumbar region: Secondary | ICD-10-CM | POA: Diagnosis not present

## 2023-01-07 DIAGNOSIS — M9902 Segmental and somatic dysfunction of thoracic region: Secondary | ICD-10-CM | POA: Diagnosis not present

## 2023-01-07 DIAGNOSIS — S39012A Strain of muscle, fascia and tendon of lower back, initial encounter: Secondary | ICD-10-CM | POA: Diagnosis not present

## 2023-01-07 DIAGNOSIS — M5137 Other intervertebral disc degeneration, lumbosacral region: Secondary | ICD-10-CM | POA: Diagnosis not present

## 2023-01-07 DIAGNOSIS — S29012A Strain of muscle and tendon of back wall of thorax, initial encounter: Secondary | ICD-10-CM | POA: Diagnosis not present

## 2023-01-07 DIAGNOSIS — M5412 Radiculopathy, cervical region: Secondary | ICD-10-CM

## 2023-01-07 DIAGNOSIS — M5032 Other cervical disc degeneration, mid-cervical region, unspecified level: Secondary | ICD-10-CM | POA: Diagnosis not present

## 2023-01-07 NOTE — Telephone Encounter (Signed)
I spoke with pt. X-ray scheduled for tomorrow at 2pm. Order in.

## 2023-01-07 NOTE — Telephone Encounter (Signed)
Pt is calling and would like neck xray with 3 different views. Pt said [facility] fax over order. Please advise

## 2023-01-08 ENCOUNTER — Ambulatory Visit (INDEPENDENT_AMBULATORY_CARE_PROVIDER_SITE_OTHER): Payer: Medicare Other

## 2023-01-08 ENCOUNTER — Other Ambulatory Visit: Payer: Medicare Other

## 2023-01-08 DIAGNOSIS — M542 Cervicalgia: Secondary | ICD-10-CM | POA: Diagnosis not present

## 2023-01-08 DIAGNOSIS — M5412 Radiculopathy, cervical region: Secondary | ICD-10-CM

## 2023-01-10 DIAGNOSIS — M9903 Segmental and somatic dysfunction of lumbar region: Secondary | ICD-10-CM | POA: Diagnosis not present

## 2023-01-10 DIAGNOSIS — M9905 Segmental and somatic dysfunction of pelvic region: Secondary | ICD-10-CM | POA: Diagnosis not present

## 2023-01-10 DIAGNOSIS — M9902 Segmental and somatic dysfunction of thoracic region: Secondary | ICD-10-CM | POA: Diagnosis not present

## 2023-01-10 DIAGNOSIS — M5137 Other intervertebral disc degeneration, lumbosacral region: Secondary | ICD-10-CM | POA: Diagnosis not present

## 2023-01-10 DIAGNOSIS — M5032 Other cervical disc degeneration, mid-cervical region, unspecified level: Secondary | ICD-10-CM | POA: Diagnosis not present

## 2023-01-10 DIAGNOSIS — S39012A Strain of muscle, fascia and tendon of lower back, initial encounter: Secondary | ICD-10-CM | POA: Diagnosis not present

## 2023-01-10 DIAGNOSIS — S29012A Strain of muscle and tendon of back wall of thorax, initial encounter: Secondary | ICD-10-CM | POA: Diagnosis not present

## 2023-01-10 DIAGNOSIS — M9901 Segmental and somatic dysfunction of cervical region: Secondary | ICD-10-CM | POA: Diagnosis not present

## 2023-01-15 ENCOUNTER — Ambulatory Visit (INDEPENDENT_AMBULATORY_CARE_PROVIDER_SITE_OTHER): Payer: Medicare Other | Admitting: *Deleted

## 2023-01-15 VITALS — BP 133/84 | HR 56 | Temp 98.4°F | Resp 16 | Ht 62.0 in | Wt 204.6 lb

## 2023-01-15 DIAGNOSIS — J4540 Moderate persistent asthma, uncomplicated: Secondary | ICD-10-CM | POA: Diagnosis not present

## 2023-01-15 DIAGNOSIS — M9905 Segmental and somatic dysfunction of pelvic region: Secondary | ICD-10-CM | POA: Diagnosis not present

## 2023-01-15 DIAGNOSIS — M9903 Segmental and somatic dysfunction of lumbar region: Secondary | ICD-10-CM | POA: Diagnosis not present

## 2023-01-15 DIAGNOSIS — M9901 Segmental and somatic dysfunction of cervical region: Secondary | ICD-10-CM | POA: Diagnosis not present

## 2023-01-15 DIAGNOSIS — M5137 Other intervertebral disc degeneration, lumbosacral region: Secondary | ICD-10-CM | POA: Diagnosis not present

## 2023-01-15 DIAGNOSIS — M5032 Other cervical disc degeneration, mid-cervical region, unspecified level: Secondary | ICD-10-CM | POA: Diagnosis not present

## 2023-01-15 DIAGNOSIS — S29012A Strain of muscle and tendon of back wall of thorax, initial encounter: Secondary | ICD-10-CM | POA: Diagnosis not present

## 2023-01-15 DIAGNOSIS — M9902 Segmental and somatic dysfunction of thoracic region: Secondary | ICD-10-CM | POA: Diagnosis not present

## 2023-01-15 DIAGNOSIS — S39012A Strain of muscle, fascia and tendon of lower back, initial encounter: Secondary | ICD-10-CM | POA: Diagnosis not present

## 2023-01-15 MED ORDER — MEPOLIZUMAB 100 MG ~~LOC~~ SOLR
100.0000 mg | Freq: Once | SUBCUTANEOUS | Status: AC
  Administered 2023-01-15: 100 mg via SUBCUTANEOUS
  Filled 2023-01-15: qty 1

## 2023-01-15 NOTE — Progress Notes (Signed)
Diagnosis: Asthma  Provider:  Chilton Greathouse MD  Procedure: Injection  Nucala (Mepolizumab), Dose: 100 mg, Site: subcutaneous, Number of injections: 1  Post Care: Observation period completed  Discharge: Condition: Good, Destination: Home . AVS Provided  Performed by:  Forrest Moron, RN

## 2023-01-16 ENCOUNTER — Encounter: Payer: Self-pay | Admitting: Family Medicine

## 2023-01-16 ENCOUNTER — Telehealth: Payer: Self-pay | Admitting: Family Medicine

## 2023-01-16 NOTE — Telephone Encounter (Signed)
error 

## 2023-01-17 DIAGNOSIS — M9902 Segmental and somatic dysfunction of thoracic region: Secondary | ICD-10-CM | POA: Diagnosis not present

## 2023-01-17 DIAGNOSIS — M5137 Other intervertebral disc degeneration, lumbosacral region: Secondary | ICD-10-CM | POA: Diagnosis not present

## 2023-01-17 DIAGNOSIS — M5032 Other cervical disc degeneration, mid-cervical region, unspecified level: Secondary | ICD-10-CM | POA: Diagnosis not present

## 2023-01-17 DIAGNOSIS — S29012A Strain of muscle and tendon of back wall of thorax, initial encounter: Secondary | ICD-10-CM | POA: Diagnosis not present

## 2023-01-17 DIAGNOSIS — M9905 Segmental and somatic dysfunction of pelvic region: Secondary | ICD-10-CM | POA: Diagnosis not present

## 2023-01-17 DIAGNOSIS — S39012A Strain of muscle, fascia and tendon of lower back, initial encounter: Secondary | ICD-10-CM | POA: Diagnosis not present

## 2023-01-17 DIAGNOSIS — M9903 Segmental and somatic dysfunction of lumbar region: Secondary | ICD-10-CM | POA: Diagnosis not present

## 2023-01-17 DIAGNOSIS — M9901 Segmental and somatic dysfunction of cervical region: Secondary | ICD-10-CM | POA: Diagnosis not present

## 2023-01-21 DIAGNOSIS — M9901 Segmental and somatic dysfunction of cervical region: Secondary | ICD-10-CM | POA: Diagnosis not present

## 2023-01-21 DIAGNOSIS — M9902 Segmental and somatic dysfunction of thoracic region: Secondary | ICD-10-CM | POA: Diagnosis not present

## 2023-01-21 DIAGNOSIS — S39012A Strain of muscle, fascia and tendon of lower back, initial encounter: Secondary | ICD-10-CM | POA: Diagnosis not present

## 2023-01-21 DIAGNOSIS — S29012A Strain of muscle and tendon of back wall of thorax, initial encounter: Secondary | ICD-10-CM | POA: Diagnosis not present

## 2023-01-21 DIAGNOSIS — M9903 Segmental and somatic dysfunction of lumbar region: Secondary | ICD-10-CM | POA: Diagnosis not present

## 2023-01-21 DIAGNOSIS — M5032 Other cervical disc degeneration, mid-cervical region, unspecified level: Secondary | ICD-10-CM | POA: Diagnosis not present

## 2023-01-21 DIAGNOSIS — M5137 Other intervertebral disc degeneration, lumbosacral region: Secondary | ICD-10-CM | POA: Diagnosis not present

## 2023-01-21 DIAGNOSIS — M9905 Segmental and somatic dysfunction of pelvic region: Secondary | ICD-10-CM | POA: Diagnosis not present

## 2023-01-23 DIAGNOSIS — S39012A Strain of muscle, fascia and tendon of lower back, initial encounter: Secondary | ICD-10-CM | POA: Diagnosis not present

## 2023-01-23 DIAGNOSIS — M9901 Segmental and somatic dysfunction of cervical region: Secondary | ICD-10-CM | POA: Diagnosis not present

## 2023-01-23 DIAGNOSIS — M5032 Other cervical disc degeneration, mid-cervical region, unspecified level: Secondary | ICD-10-CM | POA: Diagnosis not present

## 2023-01-23 DIAGNOSIS — M9903 Segmental and somatic dysfunction of lumbar region: Secondary | ICD-10-CM | POA: Diagnosis not present

## 2023-01-23 DIAGNOSIS — M5137 Other intervertebral disc degeneration, lumbosacral region: Secondary | ICD-10-CM | POA: Diagnosis not present

## 2023-01-23 DIAGNOSIS — S29012A Strain of muscle and tendon of back wall of thorax, initial encounter: Secondary | ICD-10-CM | POA: Diagnosis not present

## 2023-01-23 DIAGNOSIS — M9905 Segmental and somatic dysfunction of pelvic region: Secondary | ICD-10-CM | POA: Diagnosis not present

## 2023-01-23 DIAGNOSIS — M9902 Segmental and somatic dysfunction of thoracic region: Secondary | ICD-10-CM | POA: Diagnosis not present

## 2023-01-29 DIAGNOSIS — Z96643 Presence of artificial hip joint, bilateral: Secondary | ICD-10-CM | POA: Diagnosis not present

## 2023-01-29 DIAGNOSIS — Z09 Encounter for follow-up examination after completed treatment for conditions other than malignant neoplasm: Secondary | ICD-10-CM | POA: Diagnosis not present

## 2023-01-29 DIAGNOSIS — Z96653 Presence of artificial knee joint, bilateral: Secondary | ICD-10-CM | POA: Diagnosis not present

## 2023-02-05 ENCOUNTER — Other Ambulatory Visit: Payer: Self-pay | Admitting: Family Medicine

## 2023-02-05 DIAGNOSIS — Z1231 Encounter for screening mammogram for malignant neoplasm of breast: Secondary | ICD-10-CM

## 2023-02-06 ENCOUNTER — Ambulatory Visit: Payer: Medicare Other

## 2023-02-07 DIAGNOSIS — H35371 Puckering of macula, right eye: Secondary | ICD-10-CM | POA: Diagnosis not present

## 2023-02-07 DIAGNOSIS — H35033 Hypertensive retinopathy, bilateral: Secondary | ICD-10-CM | POA: Diagnosis not present

## 2023-02-07 DIAGNOSIS — H34812 Central retinal vein occlusion, left eye, with macular edema: Secondary | ICD-10-CM | POA: Diagnosis not present

## 2023-02-07 DIAGNOSIS — H43813 Vitreous degeneration, bilateral: Secondary | ICD-10-CM | POA: Diagnosis not present

## 2023-02-07 DIAGNOSIS — H3582 Retinal ischemia: Secondary | ICD-10-CM | POA: Diagnosis not present

## 2023-02-08 DIAGNOSIS — M9905 Segmental and somatic dysfunction of pelvic region: Secondary | ICD-10-CM | POA: Diagnosis not present

## 2023-02-08 DIAGNOSIS — M5032 Other cervical disc degeneration, mid-cervical region, unspecified level: Secondary | ICD-10-CM | POA: Diagnosis not present

## 2023-02-08 DIAGNOSIS — M9901 Segmental and somatic dysfunction of cervical region: Secondary | ICD-10-CM | POA: Diagnosis not present

## 2023-02-08 DIAGNOSIS — M5137 Other intervertebral disc degeneration, lumbosacral region: Secondary | ICD-10-CM | POA: Diagnosis not present

## 2023-02-08 DIAGNOSIS — M9903 Segmental and somatic dysfunction of lumbar region: Secondary | ICD-10-CM | POA: Diagnosis not present

## 2023-02-08 DIAGNOSIS — S39012A Strain of muscle, fascia and tendon of lower back, initial encounter: Secondary | ICD-10-CM | POA: Diagnosis not present

## 2023-02-08 DIAGNOSIS — M9902 Segmental and somatic dysfunction of thoracic region: Secondary | ICD-10-CM | POA: Diagnosis not present

## 2023-02-08 DIAGNOSIS — S29012A Strain of muscle and tendon of back wall of thorax, initial encounter: Secondary | ICD-10-CM | POA: Diagnosis not present

## 2023-02-12 ENCOUNTER — Ambulatory Visit (INDEPENDENT_AMBULATORY_CARE_PROVIDER_SITE_OTHER): Payer: Medicare Other

## 2023-02-12 VITALS — BP 131/83 | HR 60 | Temp 98.0°F | Resp 18 | Ht 62.0 in | Wt 202.4 lb

## 2023-02-12 DIAGNOSIS — J4540 Moderate persistent asthma, uncomplicated: Secondary | ICD-10-CM | POA: Diagnosis not present

## 2023-02-12 MED ORDER — MEPOLIZUMAB 100 MG ~~LOC~~ SOLR
100.0000 mg | Freq: Once | SUBCUTANEOUS | Status: AC
  Administered 2023-02-12: 100 mg via SUBCUTANEOUS
  Filled 2023-02-12: qty 1

## 2023-02-12 NOTE — Progress Notes (Signed)
Diagnosis: Asthma  Provider:  Chilton Greathouse MD  Procedure: Injection  Nucala (Mepolizumab), Dose: 100 mg, Site: subcutaneous, Number of injections: 1  Post Care:  n/a  Discharge: Condition: Good, Destination: Home . AVS Declined  Performed by:  Loney Hering, LPN

## 2023-02-25 DIAGNOSIS — M25571 Pain in right ankle and joints of right foot: Secondary | ICD-10-CM | POA: Diagnosis not present

## 2023-02-25 DIAGNOSIS — M19071 Primary osteoarthritis, right ankle and foot: Secondary | ICD-10-CM | POA: Diagnosis not present

## 2023-03-11 ENCOUNTER — Ambulatory Visit
Admission: RE | Admit: 2023-03-11 | Discharge: 2023-03-11 | Disposition: A | Discharge status: Home / Self Care | Payer: Medicare Other | Source: Ambulatory Visit | Attending: Family Medicine | Admitting: Family Medicine

## 2023-03-11 DIAGNOSIS — Z1231 Encounter for screening mammogram for malignant neoplasm of breast: Secondary | ICD-10-CM

## 2023-03-12 ENCOUNTER — Ambulatory Visit (INDEPENDENT_AMBULATORY_CARE_PROVIDER_SITE_OTHER): Payer: Medicare Other

## 2023-03-12 VITALS — BP 138/85 | HR 58 | Temp 97.6°F | Resp 20 | Ht 61.0 in | Wt 202.0 lb

## 2023-03-12 DIAGNOSIS — S29012A Strain of muscle and tendon of back wall of thorax, initial encounter: Secondary | ICD-10-CM | POA: Diagnosis not present

## 2023-03-12 DIAGNOSIS — M9905 Segmental and somatic dysfunction of pelvic region: Secondary | ICD-10-CM | POA: Diagnosis not present

## 2023-03-12 DIAGNOSIS — M5032 Other cervical disc degeneration, mid-cervical region, unspecified level: Secondary | ICD-10-CM | POA: Diagnosis not present

## 2023-03-12 DIAGNOSIS — J4540 Moderate persistent asthma, uncomplicated: Secondary | ICD-10-CM

## 2023-03-12 DIAGNOSIS — M5137 Other intervertebral disc degeneration, lumbosacral region: Secondary | ICD-10-CM | POA: Diagnosis not present

## 2023-03-12 DIAGNOSIS — M9901 Segmental and somatic dysfunction of cervical region: Secondary | ICD-10-CM | POA: Diagnosis not present

## 2023-03-12 DIAGNOSIS — S39012A Strain of muscle, fascia and tendon of lower back, initial encounter: Secondary | ICD-10-CM | POA: Diagnosis not present

## 2023-03-12 DIAGNOSIS — M9902 Segmental and somatic dysfunction of thoracic region: Secondary | ICD-10-CM | POA: Diagnosis not present

## 2023-03-12 DIAGNOSIS — M9903 Segmental and somatic dysfunction of lumbar region: Secondary | ICD-10-CM | POA: Diagnosis not present

## 2023-03-12 MED ORDER — MEPOLIZUMAB 100 MG ~~LOC~~ SOLR
100.0000 mg | Freq: Once | SUBCUTANEOUS | Status: AC
  Administered 2023-03-12: 100 mg via SUBCUTANEOUS
  Filled 2023-03-12: qty 1

## 2023-03-12 NOTE — Progress Notes (Signed)
Diagnosis: Asthma  Provider:  Chilton Greathouse MD  Procedure: Injection  Nucala (Mepolizumab), Dose: 100 mg, Site: subcutaneous, Number of injections: 1  Post Care:     Discharge: Condition: Good, Destination: Home . AVS Provided  Performed by:  Garnette Czech, RN

## 2023-03-14 DIAGNOSIS — Z789 Other specified health status: Secondary | ICD-10-CM | POA: Diagnosis not present

## 2023-03-14 DIAGNOSIS — D229 Melanocytic nevi, unspecified: Secondary | ICD-10-CM | POA: Diagnosis not present

## 2023-03-14 DIAGNOSIS — L814 Other melanin hyperpigmentation: Secondary | ICD-10-CM | POA: Diagnosis not present

## 2023-03-14 DIAGNOSIS — L821 Other seborrheic keratosis: Secondary | ICD-10-CM | POA: Diagnosis not present

## 2023-03-14 DIAGNOSIS — L820 Inflamed seborrheic keratosis: Secondary | ICD-10-CM | POA: Diagnosis not present

## 2023-03-19 DIAGNOSIS — S39012A Strain of muscle, fascia and tendon of lower back, initial encounter: Secondary | ICD-10-CM | POA: Diagnosis not present

## 2023-03-19 DIAGNOSIS — M9905 Segmental and somatic dysfunction of pelvic region: Secondary | ICD-10-CM | POA: Diagnosis not present

## 2023-03-19 DIAGNOSIS — M9901 Segmental and somatic dysfunction of cervical region: Secondary | ICD-10-CM | POA: Diagnosis not present

## 2023-03-19 DIAGNOSIS — S29012A Strain of muscle and tendon of back wall of thorax, initial encounter: Secondary | ICD-10-CM | POA: Diagnosis not present

## 2023-03-19 DIAGNOSIS — M9902 Segmental and somatic dysfunction of thoracic region: Secondary | ICD-10-CM | POA: Diagnosis not present

## 2023-03-19 DIAGNOSIS — M5032 Other cervical disc degeneration, mid-cervical region, unspecified level: Secondary | ICD-10-CM | POA: Diagnosis not present

## 2023-03-19 DIAGNOSIS — M5137 Other intervertebral disc degeneration, lumbosacral region: Secondary | ICD-10-CM | POA: Diagnosis not present

## 2023-03-19 DIAGNOSIS — M9903 Segmental and somatic dysfunction of lumbar region: Secondary | ICD-10-CM | POA: Diagnosis not present

## 2023-03-21 ENCOUNTER — Other Ambulatory Visit: Payer: Self-pay | Admitting: Family Medicine

## 2023-03-21 DIAGNOSIS — M25571 Pain in right ankle and joints of right foot: Secondary | ICD-10-CM

## 2023-04-02 DIAGNOSIS — S39012A Strain of muscle, fascia and tendon of lower back, initial encounter: Secondary | ICD-10-CM | POA: Diagnosis not present

## 2023-04-02 DIAGNOSIS — M9901 Segmental and somatic dysfunction of cervical region: Secondary | ICD-10-CM | POA: Diagnosis not present

## 2023-04-02 DIAGNOSIS — S29012A Strain of muscle and tendon of back wall of thorax, initial encounter: Secondary | ICD-10-CM | POA: Diagnosis not present

## 2023-04-02 DIAGNOSIS — M9902 Segmental and somatic dysfunction of thoracic region: Secondary | ICD-10-CM | POA: Diagnosis not present

## 2023-04-02 DIAGNOSIS — M5137 Other intervertebral disc degeneration, lumbosacral region: Secondary | ICD-10-CM | POA: Diagnosis not present

## 2023-04-02 DIAGNOSIS — M9903 Segmental and somatic dysfunction of lumbar region: Secondary | ICD-10-CM | POA: Diagnosis not present

## 2023-04-02 DIAGNOSIS — M5032 Other cervical disc degeneration, mid-cervical region, unspecified level: Secondary | ICD-10-CM | POA: Diagnosis not present

## 2023-04-02 DIAGNOSIS — M9905 Segmental and somatic dysfunction of pelvic region: Secondary | ICD-10-CM | POA: Diagnosis not present

## 2023-04-04 DIAGNOSIS — H401111 Primary open-angle glaucoma, right eye, mild stage: Secondary | ICD-10-CM | POA: Diagnosis not present

## 2023-04-04 DIAGNOSIS — H34812 Central retinal vein occlusion, left eye, with macular edema: Secondary | ICD-10-CM | POA: Diagnosis not present

## 2023-04-04 DIAGNOSIS — H52223 Regular astigmatism, bilateral: Secondary | ICD-10-CM | POA: Diagnosis not present

## 2023-04-04 DIAGNOSIS — H401122 Primary open-angle glaucoma, left eye, moderate stage: Secondary | ICD-10-CM | POA: Diagnosis not present

## 2023-04-04 DIAGNOSIS — H5203 Hypermetropia, bilateral: Secondary | ICD-10-CM | POA: Diagnosis not present

## 2023-04-04 DIAGNOSIS — H524 Presbyopia: Secondary | ICD-10-CM | POA: Diagnosis not present

## 2023-04-04 DIAGNOSIS — H25012 Cortical age-related cataract, left eye: Secondary | ICD-10-CM | POA: Diagnosis not present

## 2023-04-09 ENCOUNTER — Telehealth: Payer: Self-pay | Admitting: Emergency Medicine

## 2023-04-09 ENCOUNTER — Ambulatory Visit (INDEPENDENT_AMBULATORY_CARE_PROVIDER_SITE_OTHER): Payer: Medicare Other | Admitting: *Deleted

## 2023-04-09 VITALS — BP 136/84 | HR 53 | Temp 98.4°F | Resp 16 | Ht 62.0 in | Wt 201.0 lb

## 2023-04-09 DIAGNOSIS — J4540 Moderate persistent asthma, uncomplicated: Secondary | ICD-10-CM | POA: Diagnosis not present

## 2023-04-09 MED ORDER — MEPOLIZUMAB 100 MG ~~LOC~~ SOLR
100.0000 mg | Freq: Once | SUBCUTANEOUS | Status: AC
  Administered 2023-04-09: 100 mg via SUBCUTANEOUS
  Filled 2023-04-09: qty 1

## 2023-04-09 NOTE — Telephone Encounter (Signed)
PT is moving and needs a referral sent to the following new practice:  [facility] Fax is [phone]

## 2023-04-09 NOTE — Progress Notes (Signed)
Diagnosis: Asthma  Provider:  Chilton Greathouse MD  Procedure: Injection  Nucala (Mepolizumab), Dose: 100 mg, Site: subcutaneous, Number of injections: 1  Post Care: Observation period completed  Discharge: Condition: Good, Destination: Home . AVS Provided  Performed by:  Forrest Moron, RN

## 2023-04-15 DIAGNOSIS — H35371 Puckering of macula, right eye: Secondary | ICD-10-CM | POA: Diagnosis not present

## 2023-04-15 DIAGNOSIS — H34812 Central retinal vein occlusion, left eye, with macular edema: Secondary | ICD-10-CM | POA: Diagnosis not present

## 2023-04-15 DIAGNOSIS — H35033 Hypertensive retinopathy, bilateral: Secondary | ICD-10-CM | POA: Diagnosis not present

## 2023-04-15 DIAGNOSIS — H43813 Vitreous degeneration, bilateral: Secondary | ICD-10-CM | POA: Diagnosis not present

## 2023-04-15 DIAGNOSIS — H3582 Retinal ischemia: Secondary | ICD-10-CM | POA: Diagnosis not present

## 2023-04-17 NOTE — Telephone Encounter (Signed)
Referral placed as requested.

## 2023-04-18 NOTE — Telephone Encounter (Signed)
Order has been faxed

## 2023-04-24 DIAGNOSIS — M5032 Other cervical disc degeneration, mid-cervical region, unspecified level: Secondary | ICD-10-CM | POA: Diagnosis not present

## 2023-04-24 DIAGNOSIS — M5137 Other intervertebral disc degeneration, lumbosacral region: Secondary | ICD-10-CM | POA: Diagnosis not present

## 2023-04-24 DIAGNOSIS — S39012A Strain of muscle, fascia and tendon of lower back, initial encounter: Secondary | ICD-10-CM | POA: Diagnosis not present

## 2023-04-24 DIAGNOSIS — M9901 Segmental and somatic dysfunction of cervical region: Secondary | ICD-10-CM | POA: Diagnosis not present

## 2023-04-24 DIAGNOSIS — M9905 Segmental and somatic dysfunction of pelvic region: Secondary | ICD-10-CM | POA: Diagnosis not present

## 2023-04-24 DIAGNOSIS — M9902 Segmental and somatic dysfunction of thoracic region: Secondary | ICD-10-CM | POA: Diagnosis not present

## 2023-04-24 DIAGNOSIS — S29012A Strain of muscle and tendon of back wall of thorax, initial encounter: Secondary | ICD-10-CM | POA: Diagnosis not present

## 2023-04-24 DIAGNOSIS — M9903 Segmental and somatic dysfunction of lumbar region: Secondary | ICD-10-CM | POA: Diagnosis not present

## 2023-04-25 ENCOUNTER — Other Ambulatory Visit: Payer: Self-pay | Admitting: Family Medicine

## 2023-04-25 DIAGNOSIS — F331 Major depressive disorder, recurrent, moderate: Secondary | ICD-10-CM

## 2023-04-29 NOTE — Telephone Encounter (Signed)
Prescription Request  04/29/2023  LOV: 12/28/2022  What is the name of the medication or equipment?  FLUoxetine (PROZAC) 40 MG capsule  Pt called to say she is moving at the end of the week and wants to make sure she has enough to last her until she can find a new PCP.  Pt informed MD is OOO.  Pt is asking if another provider can assist?  Have you contacted your pharmacy to request a refill? Yes   Which pharmacy would you like this sent to?  [id number] - [place], [place] - [address] AT [address] [facility] [address] Phone: [phone]  Fax: [phone]     Patient notified that their request is being sent to the clinical staff for review and that they should receive a response within 2 business days.   Please advise at Mobile [phone] (mobile)

## 2023-05-06 DIAGNOSIS — H34812 Central retinal vein occlusion, left eye, with macular edema: Secondary | ICD-10-CM | POA: Diagnosis not present

## 2023-05-06 DIAGNOSIS — H3582 Retinal ischemia: Secondary | ICD-10-CM | POA: Diagnosis not present

## 2023-05-07 ENCOUNTER — Ambulatory Visit: Payer: Medicare Other

## 2023-05-21 DIAGNOSIS — M19071 Primary osteoarthritis, right ankle and foot: Secondary | ICD-10-CM | POA: Diagnosis not present

## 2023-05-25 ENCOUNTER — Other Ambulatory Visit: Payer: Self-pay | Admitting: Family Medicine

## 2023-05-25 DIAGNOSIS — G8929 Other chronic pain: Secondary | ICD-10-CM

## 2023-05-29 ENCOUNTER — Other Ambulatory Visit: Payer: Self-pay | Admitting: Family Medicine

## 2023-05-29 DIAGNOSIS — G8929 Other chronic pain: Secondary | ICD-10-CM

## 2023-05-29 MED ORDER — GABAPENTIN 300 MG PO CAPS: ORAL_CAPSULE | ORAL | 1 refills | Status: DC

## 2023-05-29 NOTE — Addendum Note (Signed)
Addended by: Kathreen Devoid on: 05/29/2023 01:32 PM   Modules accepted: Orders

## 2023-05-29 NOTE — Telephone Encounter (Signed)
Prescription Request  05/29/2023  LOV: 12/28/2022  What is the name of the medication or equipment? Gabapentin. Pt is recently moved to [place] and [facility] [place] [place]. She is asking for 1 refill.   Have you contacted your pharmacy to request a refill? Yes   Which pharmacy would you like this sent to? [id number] [place], [place] - [address] [place] [address] Phone: [phone] Fax: [phone]   Patient notified that their request is being sent to the clinical staff for review and that they should receive a response within 2 business days.   Please advise at Mobile [phone] (mobile)

## 2023-05-31 DIAGNOSIS — I10 Essential (primary) hypertension: Secondary | ICD-10-CM | POA: Diagnosis not present

## 2023-05-31 DIAGNOSIS — J986 Disorders of diaphragm: Secondary | ICD-10-CM | POA: Diagnosis not present

## 2023-05-31 DIAGNOSIS — H409 Unspecified glaucoma: Secondary | ICD-10-CM | POA: Diagnosis not present

## 2023-05-31 DIAGNOSIS — R9389 Abnormal findings on diagnostic imaging of other specified body structures: Secondary | ICD-10-CM | POA: Diagnosis not present

## 2023-05-31 DIAGNOSIS — J4550 Severe persistent asthma, uncomplicated: Secondary | ICD-10-CM | POA: Diagnosis not present

## 2023-05-31 DIAGNOSIS — R918 Other nonspecific abnormal finding of lung field: Secondary | ICD-10-CM | POA: Diagnosis not present

## 2023-05-31 MED ORDER — GABAPENTIN 300 MG PO CAPS: ORAL_CAPSULE | ORAL | 0 refills | Status: DC

## 2023-05-31 NOTE — Addendum Note (Signed)
Addended by: Kathreen Devoid on: 05/31/2023 03:27 PM   Modules accepted: Orders

## 2023-06-18 DIAGNOSIS — H34812 Central retinal vein occlusion, left eye, with macular edema: Secondary | ICD-10-CM | POA: Diagnosis not present

## 2023-06-18 DIAGNOSIS — H35373 Puckering of macula, bilateral: Secondary | ICD-10-CM | POA: Diagnosis not present

## 2023-06-27 ENCOUNTER — Telehealth: Payer: Self-pay | Admitting: Family Medicine

## 2023-06-27 DIAGNOSIS — G8929 Other chronic pain: Secondary | ICD-10-CM

## 2023-06-27 NOTE — Telephone Encounter (Signed)
Pt call and stated she want dr.Jordan to call her a refill in on     gabapentin (NEURONTIN) 300 MG capsule more time until she see her new dr.on 07/16/23 also have no seen dr.Jordan since 12/28/22.pt want it call in to  [id number] [place], [place] - [address] Phone: [phone]  Fax: [phone]

## 2023-06-28 MED ORDER — GABAPENTIN 300 MG PO CAPS
ORAL_CAPSULE | ORAL | 0 refills | Status: AC
Start: 2023-06-28 — End: ?

## 2023-06-28 NOTE — Telephone Encounter (Signed)
Printed to be signed by PCP, will fax once signed.

## 2023-06-30 ENCOUNTER — Other Ambulatory Visit: Payer: Self-pay | Admitting: Family Medicine

## 2023-06-30 DIAGNOSIS — M25571 Pain in right ankle and joints of right foot: Secondary | ICD-10-CM

## 2023-07-01 ENCOUNTER — Other Ambulatory Visit: Payer: Self-pay | Admitting: Family Medicine

## 2023-07-01 DIAGNOSIS — G8929 Other chronic pain: Secondary | ICD-10-CM

## 2023-07-08 DIAGNOSIS — J4550 Severe persistent asthma, uncomplicated: Secondary | ICD-10-CM | POA: Diagnosis not present

## 2023-07-16 DIAGNOSIS — I10 Essential (primary) hypertension: Secondary | ICD-10-CM | POA: Diagnosis not present

## 2023-07-16 DIAGNOSIS — Z79899 Other long term (current) drug therapy: Secondary | ICD-10-CM | POA: Diagnosis not present

## 2023-07-16 DIAGNOSIS — H409 Unspecified glaucoma: Secondary | ICD-10-CM | POA: Diagnosis not present

## 2023-07-16 DIAGNOSIS — G8929 Other chronic pain: Secondary | ICD-10-CM | POA: Diagnosis not present

## 2023-07-16 DIAGNOSIS — M8580 Other specified disorders of bone density and structure, unspecified site: Secondary | ICD-10-CM | POA: Diagnosis not present

## 2023-07-16 DIAGNOSIS — J4520 Mild intermittent asthma, uncomplicated: Secondary | ICD-10-CM | POA: Diagnosis not present

## 2023-07-16 DIAGNOSIS — M5442 Lumbago with sciatica, left side: Secondary | ICD-10-CM | POA: Diagnosis not present

## 2023-10-05 ENCOUNTER — Other Ambulatory Visit: Payer: Self-pay | Admitting: Primary Care
